# Patient Record
Sex: Female | Born: 1975 | Race: White | Hispanic: No | Marital: Married | Smoking: Former smoker
Health system: Southern US, Community
[De-identification: ages and names within clinical notes are randomized; demographics above are authoritative.]

## PROBLEM LIST (undated history)

## (undated) DIAGNOSIS — F419 Anxiety disorder, unspecified: Secondary | ICD-10-CM

## (undated) DIAGNOSIS — E079 Disorder of thyroid, unspecified: Secondary | ICD-10-CM

## (undated) DIAGNOSIS — E039 Hypothyroidism, unspecified: Secondary | ICD-10-CM

## (undated) HISTORY — PX: CHOLECYSTECTOMY: SHX55

## (undated) HISTORY — PX: ECTOPIC PREGNANCY SURGERY: SHX613

---

## 2005-02-17 ENCOUNTER — Emergency Department: Payer: Self-pay | Admitting: Emergency Medicine

## 2005-02-17 ENCOUNTER — Ambulatory Visit: Payer: Self-pay | Admitting: Emergency Medicine

## 2005-02-25 ENCOUNTER — Ambulatory Visit: Payer: Self-pay | Admitting: Surgery

## 2007-07-31 ENCOUNTER — Other Ambulatory Visit: Payer: Self-pay

## 2007-07-31 ENCOUNTER — Inpatient Hospital Stay: Payer: Self-pay

## 2009-09-13 ENCOUNTER — Ambulatory Visit: Payer: Self-pay | Admitting: Internal Medicine

## 2010-06-15 ENCOUNTER — Ambulatory Visit: Payer: Self-pay | Admitting: Internal Medicine

## 2010-06-25 ENCOUNTER — Ambulatory Visit: Payer: Self-pay | Admitting: Internal Medicine

## 2010-08-11 ENCOUNTER — Ambulatory Visit: Payer: Self-pay | Admitting: Family Medicine

## 2010-11-03 ENCOUNTER — Ambulatory Visit: Payer: Self-pay | Admitting: Family Medicine

## 2012-07-29 ENCOUNTER — Ambulatory Visit: Payer: Self-pay | Admitting: Emergency Medicine

## 2012-07-31 LAB — BETA STREP CULTURE(ARMC)

## 2013-02-02 ENCOUNTER — Emergency Department: Payer: Self-pay | Admitting: Emergency Medicine

## 2013-05-08 ENCOUNTER — Ambulatory Visit: Payer: Self-pay | Admitting: Podiatry

## 2017-01-17 ENCOUNTER — Other Ambulatory Visit: Payer: Self-pay | Admitting: Orthopedic Surgery

## 2017-01-17 DIAGNOSIS — G8929 Other chronic pain: Secondary | ICD-10-CM

## 2017-01-17 DIAGNOSIS — M25512 Pain in left shoulder: Principal | ICD-10-CM

## 2017-01-17 DIAGNOSIS — M25312 Other instability, left shoulder: Secondary | ICD-10-CM

## 2017-01-17 DIAGNOSIS — S46002A Unspecified injury of muscle(s) and tendon(s) of the rotator cuff of left shoulder, initial encounter: Secondary | ICD-10-CM

## 2017-01-23 ENCOUNTER — Ambulatory Visit: Payer: Managed Care, Other (non HMO)

## 2017-01-27 ENCOUNTER — Ambulatory Visit: Payer: Managed Care, Other (non HMO)

## 2017-02-20 ENCOUNTER — Encounter: Payer: Self-pay | Admitting: *Deleted

## 2017-02-20 ENCOUNTER — Ambulatory Visit
Admission: EM | Admit: 2017-02-20 | Discharge: 2017-02-20 | Disposition: A | Discharge status: Home / Self Care | Payer: Managed Care, Other (non HMO) | Attending: Family Medicine | Admitting: Family Medicine

## 2017-02-20 DIAGNOSIS — R109 Unspecified abdominal pain: Secondary | ICD-10-CM | POA: Diagnosis not present

## 2017-02-20 DIAGNOSIS — R0781 Pleurodynia: Secondary | ICD-10-CM

## 2017-02-20 HISTORY — DX: Disorder of thyroid, unspecified: E07.9

## 2017-02-20 LAB — URINALYSIS, COMPLETE (UACMP) WITH MICROSCOPIC
Bilirubin Urine: NEGATIVE
GLUCOSE, UA: NEGATIVE mg/dL
Ketones, ur: NEGATIVE mg/dL
NITRITE: NEGATIVE
PROTEIN: NEGATIVE mg/dL
SPECIFIC GRAVITY, URINE: 1.025 (ref 1.005–1.030)
pH: 7 (ref 5.0–8.0)

## 2017-02-20 MED ORDER — DICLOFENAC SODIUM 75 MG PO TBEC: 75.0000 mg | DELAYED_RELEASE_TABLET | Freq: Two times a day (BID) | ORAL | 0 refills | Status: DC

## 2017-02-20 NOTE — ED Triage Notes (Signed)
LUQ abd pain x2 days. Sharp in quality and is worse laying down. Denies injury and other symptoms.

## 2017-02-20 NOTE — Discharge Instructions (Signed)
Musculoskeletal rib pain.  Medication as prescribed.  Take care  Dr. Lacinda Axon

## 2017-02-20 NOTE — ED Provider Notes (Signed)
[facility]    CSN: [id number] Arrival date & time: 02/20/17  1228     History   Chief Complaint Chief Complaint  Patient presents with  . Abdominal Pain   HPI 41 year old female presents with left rib pain.  Patient states that this is been going on for the past 2 weeks. Unknown inciting event. No fall, trauma, injury. Pain is sharp. Severe at times. Worse at night. Located at a discrete area on a left lower rib. Been worse over the past 48 hours. No associated nausea or vomiting. No reports of abdominal pain. No known exacerbating factors. No other associated symptoms. No other complaints or concerns at this time.    PMH: GAD, Low HDL, Hypertriglyceridemia, RLS, Hypothyroidism, Chronic left shoulder pain  Surgical Hx - Ectopic pregnancy surgery, Cholecystectomy.  Home Medications    Family History Colon cancer Father    Coronary Artery Disease (Blocked arteries around heart) Father    Diabetes type II Father    Pacemaker Father    Stroke Father    Cancer Maternal Grandfather    High blood pressure (Hypertension) Mother    Stroke Mother  mini-strokes being diagnosed currently  Diabetes type II Paternal Uncle    Pacemaker Paternal Uncle    No Known Problems Sister     Social History Social History  Substance Use Topics  . Smoking status: Former Research scientist (life sciences)  . Smokeless tobacco: Never Used  . Alcohol use Yes   Allergies   C[place]alt  Review of Systems Review of Systems  Constitutional: Negative.   Musculoskeletal:       Rib pain.   All other systems reviewed and are negative.  Physical Exam Triage Vital Signs ED Triage Vitals  Enc Vitals Group     BP      Pulse      Resp      Temp      Temp src      SpO2      Weight      Height      Head Circumference      Peak Flow      Pain Score      Pain Loc      Pain Edu?      Excl. in [place]?    Updated Vital Signs BP 124/75 (BP Location: Left Arm)   Pulse 71   Temp 98.7 F (37.1  C) (Oral)   Resp 16   Ht 5\' 3"  (1.6 m)   Wt 253 lb (114.8 kg)   LMP 01/30/2017 (Approximate)   SpO2 100%   BMI 44.82 kg/m   Physical Exam  Constitutional: She is oriented to person, place, and time. She appears well-developed. No distress.  HENT:  Head: Normocephalic and atraumatic.  Nose: Nose normal.  Eyes: Conjunctivae are normal. No scleral icterus.  Neck: Normal range of motion.  Cardiovascular: Normal rate and regular rhythm.   No murmur heard. Pulmonary/Chest: Effort normal. She has no wheezes. She has no rales.  Abdominal: Soft. She exhibits no distension. There is no tenderness. There is no rebound and no guarding.  Musculoskeletal:  Discrete area of tenderness over the left lower rib cage. Exquisitely tender to palpation. Decreased range of motion as a result.  Neurological: She is alert and oriented to person, place, and time.  Skin: Skin is warm. No rash noted.  Psychiatric: She has a normal mood and affect.  Vitals reviewed.  UC Treatments / Results  Labs (all labs ordered  are listed, but only abnormal results are displayed) Labs Reviewed  URINALYSIS, COMPLETE (UACMP) WITH MICROSCOPIC - Abnormal; Notable for the following:       Result Value   APPearance HAZY (*)    Hgb urine dipstick SMALL (*)    Leukocytes, UA SMALL (*)    Squamous Epithelial / LPF 6-30 (*)    Bacteria, UA RARE (*)    All other components within normal limits    EKG  EKG Interpretation None      Radiology No results found.  Procedures Procedures (including critical care time)  Medications Ordered in UC Medications - No data to display   Initial Impression / Assessment and Plan / UC Course  I have reviewed the triage vital signs and the nursing notes.  Pertinent labs & imaging results that were available during my care of the patient were reviewed by me and considered in my medical decision making (see chart for details).   41 year old female presents with MSK/rib pain.  Treating with diclofenac.  Final Clinical Impressions(s) / UC Diagnoses   Final diagnoses:  Rib pain on left side    New Prescriptions Discharge Medication List as of 02/20/2017  1:11 PM    START taking these medications   Details  diclofenac (VOLTAREN) 75 MG EC tablet Take 1 tablet (75 mg total) by mouth 2 (two) times daily., Starting Mon 02/20/2017, Normal       Controlled Substance Prescriptions [place] Controlled Substance Registry consulted? Not Applicable   Coral Spikes, DO 02/20/17 1316

## 2019-03-18 ENCOUNTER — Other Ambulatory Visit: Payer: Self-pay | Admitting: Family Medicine

## 2019-03-18 ENCOUNTER — Encounter (INDEPENDENT_AMBULATORY_CARE_PROVIDER_SITE_OTHER): Payer: Self-pay

## 2019-03-18 ENCOUNTER — Ambulatory Visit
Admission: RE | Admit: 2019-03-18 | Discharge: 2019-03-18 | Disposition: A | Discharge status: Home / Self Care | Payer: Managed Care, Other (non HMO) | Source: Ambulatory Visit | Attending: Family Medicine | Admitting: Family Medicine

## 2019-03-18 ENCOUNTER — Other Ambulatory Visit: Payer: Self-pay

## 2019-03-18 DIAGNOSIS — N939 Abnormal uterine and vaginal bleeding, unspecified: Secondary | ICD-10-CM | POA: Diagnosis present

## 2019-03-19 ENCOUNTER — Other Ambulatory Visit: Payer: Self-pay | Admitting: Family Medicine

## 2019-03-19 DIAGNOSIS — N921 Excessive and frequent menstruation with irregular cycle: Secondary | ICD-10-CM

## 2019-03-19 DIAGNOSIS — N939 Abnormal uterine and vaginal bleeding, unspecified: Secondary | ICD-10-CM

## 2019-03-26 ENCOUNTER — Other Ambulatory Visit: Payer: Self-pay | Admitting: Family Medicine

## 2019-03-26 DIAGNOSIS — Z0000 Encounter for general adult medical examination without abnormal findings: Secondary | ICD-10-CM

## 2019-03-26 DIAGNOSIS — N939 Abnormal uterine and vaginal bleeding, unspecified: Secondary | ICD-10-CM

## 2019-06-07 ENCOUNTER — Other Ambulatory Visit: Payer: Self-pay | Admitting: Obstetrics & Gynecology

## 2019-06-07 DIAGNOSIS — R1907 Generalized intra-abdominal and pelvic swelling, mass and lump: Secondary | ICD-10-CM

## 2019-06-21 ENCOUNTER — Other Ambulatory Visit: Payer: Self-pay

## 2019-06-21 ENCOUNTER — Ambulatory Visit
Admission: RE | Admit: 2019-06-21 | Discharge: 2019-06-21 | Disposition: A | Discharge status: Home / Self Care | Payer: Managed Care, Other (non HMO) | Source: Ambulatory Visit | Attending: Obstetrics & Gynecology | Admitting: Obstetrics & Gynecology

## 2019-06-21 DIAGNOSIS — R1907 Generalized intra-abdominal and pelvic swelling, mass and lump: Secondary | ICD-10-CM | POA: Diagnosis present

## 2019-06-21 MED ORDER — IOHEXOL 300 MG/ML  SOLN
100.0000 mL | Freq: Once | INTRAMUSCULAR | Status: AC | PRN
  Administered 2019-06-21: 09:00:00 100 mL via INTRAVENOUS

## 2019-07-02 ENCOUNTER — Other Ambulatory Visit: Payer: Self-pay | Admitting: Obstetrics & Gynecology

## 2019-07-12 ENCOUNTER — Encounter: Admission: RE | Admit: 2019-07-12 | Payer: Managed Care, Other (non HMO) | Source: Ambulatory Visit

## 2019-07-16 ENCOUNTER — Encounter
Admission: RE | Admit: 2019-07-16 | Discharge: 2019-07-16 | Disposition: A | Discharge status: Home / Self Care | Payer: Managed Care, Other (non HMO) | Source: Ambulatory Visit | Attending: Obstetrics & Gynecology | Admitting: Obstetrics & Gynecology

## 2019-07-16 ENCOUNTER — Other Ambulatory Visit: Payer: Self-pay

## 2019-07-16 HISTORY — DX: Hypothyroidism, unspecified: E03.9

## 2019-07-16 HISTORY — DX: Anxiety disorder, unspecified: F41.9

## 2019-07-16 NOTE — Patient Instructions (Signed)
Your procedure is scheduled on:Mon 3/8 Report to Day Surgery. [facility] To find out your arrival time please call [phone] between 1PM - 3PM on Friday 3/5.  Remember: Instructions that are not followed completely may result in serious medical risk,  up to and including death, or upon the discretion of your surgeon and anesthesiologist your  surgery may need to be rescheduled.     _X__ 1. Do not eat food after midnight the night before your procedure.                 No gum chewing or hard candies. You may drink clear liquids up to 2 hours                 before you are scheduled to arrive for your surgery- DO not drink clear                 liquids within 2 hours of the start of your surgery.                 Clear Liquids include:  water, apple juice without pulp, clear Gatorade, G2 or                  Gatorade Zero (avoid Red/Purple/Blue), Black Coffee or Tea (Do not add                 anything to coffee or tea). ___x__2.   Complete the carbohydrate drink provided to you, 2 hours before arrival.  __X__2.  On the morning of surgery brush your teeth with toothpaste and water, you                may rinse your mouth with mouthwash if you wish.  Do not swallow any toothpaste of mouthwash.     _X__ 3.  No Alcohol for 24 hours before or after surgery.   __ 4.  Do Not Smoke or use e-cigarettes For 24 Hours Prior to Your Surgery.                 Do not use any chewable tobacco products for at least 6 hours prior to                 surgery.  ____  5.  Bring all medications with you on the day of surgery if instructed.   ___x_  6.  Notify your doctor if there is any change in your medical condition      (cold, fever, infections).     Do not wear jewelry, make-up, hairpins, clips or nail polish. Do not wear lotions, powders, or perfumes. You may wear deodorant. Do not shave 48 hours prior to surgery. Men may shave face and neck. Do not bring valuables to the  hospital.    [facility] is not responsible for any belongings or valuables.  Contacts, dentures or bridgework may not be worn into surgery. Leave your suitcase in the car. After surgery it may be brought to your room. For patients admitted to the hospital, discharge time is determined by your treatment team.   Patients discharged the day of surgery will not be allowed to drive home.   Make arrangements for someone to be with you for the first 24 hours of your Same Day Discharge.    Please read over the following fact sheets that you were given:     __x__ Take these medicines the morning of surgery with A SIP OF WATER:  1. levothyroxine (SYNTHROID, LEVOTHROID) 125 MCG tablet  2.   3.   4.  5.  6.  ____ Fleet Enema (as directed)   __x__ Use CHG Soap (or wipes) as directed  ____ Use Benzoyl Peroxide Gel as instructed  ____ Use inhalers on the day of surgery  ____ Stop metformin 2 days prior to surgery    ____ Take 1/2 of usual insulin dose the night before surgery. No insulin the morning          of surgery.   ____ Stop Coumadin/Plavix/aspirin on   __x__ Stop Anti-inflammatories ibuprofen aleve or aspirin  Today.  May take tylenol   ____ Stop supplements until after surgery.    ____ Bring C-Pap to the hospital.

## 2019-07-18 ENCOUNTER — Other Ambulatory Visit
Admission: RE | Admit: 2019-07-18 | Discharge: 2019-07-18 | Disposition: A | Discharge status: Home / Self Care | Payer: Managed Care, Other (non HMO) | Source: Ambulatory Visit | Attending: Obstetrics & Gynecology | Admitting: Obstetrics & Gynecology

## 2019-07-18 DIAGNOSIS — Z01812 Encounter for preprocedural laboratory examination: Secondary | ICD-10-CM | POA: Diagnosis present

## 2019-07-18 DIAGNOSIS — Z20822 Contact with and (suspected) exposure to covid-19: Secondary | ICD-10-CM | POA: Insufficient documentation

## 2019-07-18 LAB — BASIC METABOLIC PANEL
Anion gap: 7 (ref 5–15)
BUN: 8 mg/dL (ref 6–20)
CO2: 27 mmol/L (ref 22–32)
Calcium: 9.1 mg/dL (ref 8.9–10.3)
Chloride: 104 mmol/L (ref 98–111)
Creatinine, Ser: 0.64 mg/dL (ref 0.44–1.00)
GFR calc Af Amer: >60 mL/min (ref >60)
GFR calc non Af Amer: >60 mL/min (ref >60)
Glucose, Bld: 123 mg/dL — ABNORMAL HIGH (ref 70–99)
Potassium: 4 mmol/L (ref 3.5–5.1)
Sodium: 138 mmol/L (ref 135–145)

## 2019-07-18 LAB — CBC
HCT: 42.8 % (ref 36.0–46.0)
Hemoglobin: 13.8 g/dL (ref 12.0–15.0)
MCH: 29.4 pg (ref 26.0–34.0)
MCHC: 32.2 g/dL (ref 30.0–36.0)
MCV: 91.3 fL (ref 80.0–100.0)
Platelets: 387 10*3/uL (ref 150–400)
RBC: 4.69 MIL/uL (ref 3.87–5.11)
RDW: 12.8 % (ref 11.5–15.5)
WBC: 8.7 10*3/uL (ref 4.0–10.5)
nRBC: 0 % (ref 0.0–0.2)

## 2019-07-19 LAB — SARS CORONAVIRUS 2 (TAT 6-24 HRS): SARS Coronavirus 2: NEGATIVE

## 2019-07-21 MED ORDER — DEXTROSE 5 % IV SOLN
3.0000 g | INTRAVENOUS | Status: AC
  Administered 2019-07-22 (×2): 3 g via INTRAVENOUS
  Filled 2019-07-21: qty 3

## 2019-07-22 ENCOUNTER — Inpatient Hospital Stay
Admission: RE | Admit: 2019-07-22 | Discharge: 2019-08-19 | DRG: 742 | Disposition: A | Discharge status: Home Health | Payer: Managed Care, Other (non HMO) | Attending: Surgery | Admitting: Surgery

## 2019-07-22 ENCOUNTER — Other Ambulatory Visit: Payer: Self-pay

## 2019-07-22 ENCOUNTER — Ambulatory Visit: Payer: Managed Care, Other (non HMO) | Admitting: Anesthesiology

## 2019-07-22 ENCOUNTER — Encounter: Payer: Self-pay | Admitting: Obstetrics & Gynecology

## 2019-07-22 ENCOUNTER — Encounter
Admission: RE | Disposition: A | Discharge status: Home Health | Payer: Self-pay | Source: Home / Self Care | Attending: Surgery

## 2019-07-22 ENCOUNTER — Ambulatory Visit: Payer: Managed Care, Other (non HMO)

## 2019-07-22 DIAGNOSIS — D508 Other iron deficiency anemias: Secondary | ICD-10-CM

## 2019-07-22 DIAGNOSIS — T8143XA Infection following a procedure, organ and space surgical site, initial encounter: Secondary | ICD-10-CM | POA: Diagnosis not present

## 2019-07-22 DIAGNOSIS — D696 Thrombocytopenia, unspecified: Secondary | ICD-10-CM | POA: Diagnosis not present

## 2019-07-22 DIAGNOSIS — K9172 Accidental puncture and laceration of a digestive system organ or structure during other procedure: Secondary | ICD-10-CM | POA: Diagnosis not present

## 2019-07-22 DIAGNOSIS — Z79890 Hormone replacement therapy (postmenopausal): Secondary | ICD-10-CM | POA: Diagnosis not present

## 2019-07-22 DIAGNOSIS — Z933 Colostomy status: Secondary | ICD-10-CM | POA: Diagnosis not present

## 2019-07-22 DIAGNOSIS — R188 Other ascites: Secondary | ICD-10-CM | POA: Diagnosis not present

## 2019-07-22 DIAGNOSIS — Z9889 Other specified postprocedural states: Secondary | ICD-10-CM

## 2019-07-22 DIAGNOSIS — Y658 Other specified misadventures during surgical and medical care: Secondary | ICD-10-CM | POA: Diagnosis not present

## 2019-07-22 DIAGNOSIS — K631 Perforation of intestine (nontraumatic): Secondary | ICD-10-CM | POA: Diagnosis not present

## 2019-07-22 DIAGNOSIS — E039 Hypothyroidism, unspecified: Secondary | ICD-10-CM | POA: Diagnosis present

## 2019-07-22 DIAGNOSIS — D251 Intramural leiomyoma of uterus: Principal | ICD-10-CM | POA: Diagnosis present

## 2019-07-22 DIAGNOSIS — R102 Pelvic and perineal pain: Secondary | ICD-10-CM | POA: Diagnosis present

## 2019-07-22 DIAGNOSIS — D62 Acute posthemorrhagic anemia: Secondary | ICD-10-CM | POA: Diagnosis not present

## 2019-07-22 DIAGNOSIS — Z6841 Body Mass Index (BMI) 40.0 and over, adult: Secondary | ICD-10-CM | POA: Diagnosis not present

## 2019-07-22 DIAGNOSIS — F419 Anxiety disorder, unspecified: Secondary | ICD-10-CM | POA: Diagnosis present

## 2019-07-22 DIAGNOSIS — Z87891 Personal history of nicotine dependence: Secondary | ICD-10-CM

## 2019-07-22 DIAGNOSIS — K9189 Other postprocedural complications and disorders of digestive system: Secondary | ICD-10-CM | POA: Diagnosis not present

## 2019-07-22 DIAGNOSIS — T8132XA Disruption of internal operation (surgical) wound, not elsewhere classified, initial encounter: Secondary | ICD-10-CM | POA: Diagnosis not present

## 2019-07-22 DIAGNOSIS — K660 Peritoneal adhesions (postprocedural) (postinfection): Secondary | ICD-10-CM | POA: Diagnosis present

## 2019-07-22 DIAGNOSIS — Z189 Retained foreign body fragments, unspecified material: Secondary | ICD-10-CM

## 2019-07-22 DIAGNOSIS — K651 Peritoneal abscess: Secondary | ICD-10-CM | POA: Diagnosis not present

## 2019-07-22 DIAGNOSIS — Z79899 Other long term (current) drug therapy: Secondary | ICD-10-CM

## 2019-07-22 DIAGNOSIS — D649 Anemia, unspecified: Secondary | ICD-10-CM

## 2019-07-22 DIAGNOSIS — R000 Tachycardia, unspecified: Secondary | ICD-10-CM

## 2019-07-22 DIAGNOSIS — D75839 Thrombocytosis, unspecified: Secondary | ICD-10-CM

## 2019-07-22 DIAGNOSIS — L02211 Cutaneous abscess of abdominal wall: Secondary | ICD-10-CM | POA: Diagnosis not present

## 2019-07-22 DIAGNOSIS — Z932 Ileostomy status: Secondary | ICD-10-CM | POA: Diagnosis not present

## 2019-07-22 DIAGNOSIS — Z1624 Resistance to multiple antibiotics: Secondary | ICD-10-CM | POA: Diagnosis not present

## 2019-07-22 DIAGNOSIS — R0682 Tachypnea, not elsewhere classified: Secondary | ICD-10-CM

## 2019-07-22 DIAGNOSIS — D473 Essential (hemorrhagic) thrombocythemia: Secondary | ICD-10-CM | POA: Diagnosis not present

## 2019-07-22 DIAGNOSIS — K56609 Unspecified intestinal obstruction, unspecified as to partial versus complete obstruction: Secondary | ICD-10-CM

## 2019-07-22 DIAGNOSIS — G4700 Insomnia, unspecified: Secondary | ICD-10-CM | POA: Diagnosis not present

## 2019-07-22 DIAGNOSIS — Z95828 Presence of other vascular implants and grafts: Secondary | ICD-10-CM | POA: Diagnosis not present

## 2019-07-22 DIAGNOSIS — E538 Deficiency of other specified B group vitamins: Secondary | ICD-10-CM | POA: Diagnosis not present

## 2019-07-22 DIAGNOSIS — B9620 Unspecified Escherichia coli [E. coli] as the cause of diseases classified elsewhere: Secondary | ICD-10-CM | POA: Diagnosis not present

## 2019-07-22 DIAGNOSIS — Z91048 Other nonmedicinal substance allergy status: Secondary | ICD-10-CM | POA: Diagnosis not present

## 2019-07-22 DIAGNOSIS — L0291 Cutaneous abscess, unspecified: Secondary | ICD-10-CM

## 2019-07-22 DIAGNOSIS — Z978 Presence of other specified devices: Secondary | ICD-10-CM | POA: Diagnosis not present

## 2019-07-22 DIAGNOSIS — Z960 Presence of urogenital implants: Secondary | ICD-10-CM | POA: Diagnosis not present

## 2019-07-22 DIAGNOSIS — D259 Leiomyoma of uterus, unspecified: Secondary | ICD-10-CM | POA: Diagnosis present

## 2019-07-22 DIAGNOSIS — T8130XA Disruption of wound, unspecified, initial encounter: Secondary | ICD-10-CM | POA: Diagnosis not present

## 2019-07-22 DIAGNOSIS — Z90710 Acquired absence of both cervix and uterus: Secondary | ICD-10-CM | POA: Diagnosis not present

## 2019-07-22 DIAGNOSIS — D72829 Elevated white blood cell count, unspecified: Secondary | ICD-10-CM | POA: Diagnosis not present

## 2019-07-22 HISTORY — PX: ROBOTIC ASSISTED TOTAL HYSTERECTOMY WITH BILATERAL SALPINGO OOPHERECTOMY: SHX6086

## 2019-07-22 HISTORY — PX: BOWEL RESECTION: SHX1257

## 2019-07-22 HISTORY — PX: LAPAROTOMY: SHX154

## 2019-07-22 HISTORY — PX: HYSTERECTOMY ABDOMINAL WITH SALPINGECTOMY: SHX6725

## 2019-07-22 LAB — CBC
HCT: 31.8 % — ABNORMAL LOW (ref 36.0–46.0)
Hemoglobin: 10.5 g/dL — ABNORMAL LOW (ref 12.0–15.0)
MCH: 29.7 pg (ref 26.0–34.0)
MCHC: 33 g/dL (ref 30.0–36.0)
MCV: 89.8 fL (ref 80.0–100.0)
Platelets: 320 10*3/uL (ref 150–400)
RBC: 3.54 MIL/uL — ABNORMAL LOW (ref 3.87–5.11)
RDW: 12.9 % (ref 11.5–15.5)
WBC: 16.2 10*3/uL — ABNORMAL HIGH (ref 4.0–10.5)
nRBC: 0 % (ref 0.0–0.2)

## 2019-07-22 LAB — ABO/RH: ABO/RH(D): O POS

## 2019-07-22 LAB — POCT PREGNANCY, URINE: Preg Test, Ur: NEGATIVE

## 2019-07-22 SURGERY — HYSTERECTOMY, TOTAL, ROBOT-ASSISTED, LAPAROSCOPIC, WITH BILATERAL SALPINGO-OOPHORECTOMY
Anesthesia: General | Site: Abdomen

## 2019-07-22 MED ORDER — FAMOTIDINE 20 MG PO TABS: 20.0000 mg | ORAL_TABLET | Freq: Once | ORAL | Status: AC

## 2019-07-22 MED ORDER — KETOROLAC TROMETHAMINE 15 MG/ML IJ SOLN: 15.0000 mg | INTRAMUSCULAR | Status: AC

## 2019-07-22 MED ORDER — FENTANYL CITRATE (PF) 250 MCG/5ML IJ SOLN
INTRAMUSCULAR | Status: DC | PRN
  Administered 2019-07-22 (×3): 50 ug via INTRAVENOUS
  Administered 2019-07-22: 100 ug via INTRAVENOUS

## 2019-07-22 MED ORDER — ONDANSETRON HCL 4 MG/2ML IJ SOLN: 4.0000 mg | Freq: Once | INTRAMUSCULAR | Status: DC | PRN

## 2019-07-22 MED ORDER — VASOPRESSIN 20 UNIT/ML IV SOLN
INTRAVENOUS | Status: DC | PRN
  Administered 2019-07-22 (×2): 1 [IU] via INTRAVENOUS
  Administered 2019-07-22: 2 [IU] via INTRAVENOUS
  Administered 2019-07-22 (×3): 1 [IU] via INTRAVENOUS
  Administered 2019-07-22: 2 [IU] via INTRAVENOUS
  Administered 2019-07-22 (×2): 1 [IU] via INTRAVENOUS
  Administered 2019-07-22 (×2): 2 [IU] via INTRAVENOUS

## 2019-07-22 MED ORDER — DEXMEDETOMIDINE HCL IN NACL 400 MCG/100ML IV SOLN
INTRAVENOUS | Status: DC | PRN
  Administered 2019-07-22: 8 ug via INTRAVENOUS

## 2019-07-22 MED ORDER — OXYCODONE HCL 5 MG PO TABS: 5.0000 mg | ORAL_TABLET | ORAL | 0 refills | Status: DC | PRN

## 2019-07-22 MED ORDER — ACETAMINOPHEN 10 MG/ML IV SOLN
INTRAVENOUS | Status: AC
  Filled 2019-07-22: qty 100

## 2019-07-22 MED ORDER — HEPARIN SODIUM (PORCINE) 5000 UNIT/ML IJ SOLN
INTRAMUSCULAR | Status: AC
  Administered 2019-07-22: 5000 [IU] via SUBCUTANEOUS
  Filled 2019-07-22: qty 1

## 2019-07-22 MED ORDER — FENTANYL CITRATE (PF) 100 MCG/2ML IJ SOLN
INTRAMUSCULAR | Status: AC
  Filled 2019-07-22: qty 2

## 2019-07-22 MED ORDER — KETOROLAC TROMETHAMINE 30 MG/ML IJ SOLN: 30.0000 mg | Freq: Once | INTRAMUSCULAR | Status: DC

## 2019-07-22 MED ORDER — ROCURONIUM BROMIDE 50 MG/5ML IV SOLN
INTRAVENOUS | Status: AC
  Filled 2019-07-22: qty 1

## 2019-07-22 MED ORDER — MIDAZOLAM HCL 2 MG/2ML IJ SOLN
INTRAMUSCULAR | Status: DC | PRN
  Administered 2019-07-22: 2 mg via INTRAVENOUS

## 2019-07-22 MED ORDER — KETAMINE HCL 10 MG/ML IJ SOLN
INTRAMUSCULAR | Status: DC | PRN
  Administered 2019-07-22 (×3): 25 mg via INTRAVENOUS

## 2019-07-22 MED ORDER — DEXMEDETOMIDINE HCL IN NACL 80 MCG/20ML IV SOLN
INTRAVENOUS | Status: AC
  Filled 2019-07-22: qty 20

## 2019-07-22 MED ORDER — HYDROMORPHONE HCL 1 MG/ML IJ SOLN
INTRAMUSCULAR | Status: DC | PRN
  Administered 2019-07-22 (×3): .2 mg via INTRAVENOUS

## 2019-07-22 MED ORDER — KETAMINE HCL 50 MG/ML IJ SOLN
INTRAMUSCULAR | Status: AC
  Filled 2019-07-22: qty 10

## 2019-07-22 MED ORDER — PROMETHAZINE HCL 25 MG/ML IJ SOLN: 6.2500 mg | INTRAMUSCULAR | Status: DC | PRN

## 2019-07-22 MED ORDER — SODIUM CHLORIDE 0.9 % IV SOLN
INTRAVENOUS | Status: DC | PRN
  Administered 2019-07-22: 30 ug/min via INTRAVENOUS

## 2019-07-22 MED ORDER — MIDAZOLAM HCL 2 MG/2ML IJ SOLN
INTRAMUSCULAR | Status: AC
  Filled 2019-07-22: qty 2

## 2019-07-22 MED ORDER — ENSURE PRE-SURGERY PO LIQD
296.0000 mL | Freq: Once | ORAL | Status: DC
  Filled 2019-07-22: qty 296

## 2019-07-22 MED ORDER — ACETAMINOPHEN 10 MG/ML IV SOLN
INTRAVENOUS | Status: DC | PRN
  Administered 2019-07-22: 1000 mg via INTRAVENOUS

## 2019-07-22 MED ORDER — LIDOCAINE HCL (CARDIAC) PF 100 MG/5ML IV SOSY
PREFILLED_SYRINGE | INTRAVENOUS | Status: DC | PRN
  Administered 2019-07-22: 100 mg via INTRAVENOUS

## 2019-07-22 MED ORDER — BUPIVACAINE HCL (PF) 0.5 % IJ SOLN
INTRAMUSCULAR | Status: AC
  Filled 2019-07-22: qty 30

## 2019-07-22 MED ORDER — LIDOCAINE HCL (PF) 2 % IJ SOLN
INTRAMUSCULAR | Status: AC
  Filled 2019-07-22: qty 5

## 2019-07-22 MED ORDER — FENTANYL CITRATE (PF) 100 MCG/2ML IJ SOLN
INTRAMUSCULAR | Status: DC | PRN
  Administered 2019-07-22 (×2): 50 ug via INTRAVENOUS

## 2019-07-22 MED ORDER — DEXAMETHASONE SODIUM PHOSPHATE 10 MG/ML IJ SOLN
INTRAMUSCULAR | Status: AC
  Administered 2019-07-22: 4 mg via INTRAVENOUS
  Filled 2019-07-22: qty 1

## 2019-07-22 MED ORDER — DEXAMETHASONE SODIUM PHOSPHATE 10 MG/ML IJ SOLN
INTRAMUSCULAR | Status: DC | PRN
  Administered 2019-07-22: 5 mg via INTRAVENOUS

## 2019-07-22 MED ORDER — FAMOTIDINE 20 MG PO TABS
ORAL_TABLET | ORAL | Status: AC
  Administered 2019-07-22: 20 mg via ORAL
  Filled 2019-07-22: qty 1

## 2019-07-22 MED ORDER — GABAPENTIN 300 MG PO CAPS
ORAL_CAPSULE | ORAL | Status: AC
  Administered 2019-07-22: 600 mg via ORAL
  Filled 2019-07-22: qty 2

## 2019-07-22 MED ORDER — ACETAMINOPHEN 500 MG PO TABS
ORAL_TABLET | ORAL | Status: AC
  Administered 2019-07-22: 1000 mg via ORAL
  Filled 2019-07-22: qty 2

## 2019-07-22 MED ORDER — FENTANYL CITRATE (PF) 250 MCG/5ML IJ SOLN
INTRAMUSCULAR | Status: AC
  Filled 2019-07-22: qty 5

## 2019-07-22 MED ORDER — ALBUMIN HUMAN 5 % IV SOLN: INTRAVENOUS | Status: DC | PRN

## 2019-07-22 MED ORDER — SODIUM CHLORIDE 0.9 % IV SOLN
INTRAVENOUS | Status: AC
  Filled 2019-07-22: qty 2

## 2019-07-22 MED ORDER — HYDROMORPHONE HCL 1 MG/ML IJ SOLN
INTRAMUSCULAR | Status: AC
  Filled 2019-07-22: qty 1

## 2019-07-22 MED ORDER — GABAPENTIN 300 MG PO CAPS: 600.0000 mg | ORAL_CAPSULE | ORAL | Status: AC

## 2019-07-22 MED ORDER — PROPOFOL 10 MG/ML IV BOLUS
INTRAVENOUS | Status: DC | PRN
  Administered 2019-07-22: 150 mg via INTRAVENOUS

## 2019-07-22 MED ORDER — DEXTROSE 5 % IV SOLN
INTRAVENOUS | Status: DC | PRN
  Administered 2019-07-22: 3 g via INTRAVENOUS

## 2019-07-22 MED ORDER — HYDROMORPHONE HCL 1 MG/ML IJ SOLN: 0.2500 mg | INTRAMUSCULAR | Status: DC | PRN

## 2019-07-22 MED ORDER — DEXTROSE 5 % IV SOLN
3.0000 g | Freq: Once | INTRAVENOUS | Status: DC
  Filled 2019-07-22: qty 3

## 2019-07-22 MED ORDER — DEXAMETHASONE SODIUM PHOSPHATE 10 MG/ML IJ SOLN: 4.0000 mg | INTRAMUSCULAR | Status: AC

## 2019-07-22 MED ORDER — SUCCINYLCHOLINE CHLORIDE 20 MG/ML IJ SOLN
INTRAMUSCULAR | Status: AC
  Filled 2019-07-22: qty 1

## 2019-07-22 MED ORDER — ALBUMIN HUMAN 5 % IV SOLN
INTRAVENOUS | Status: AC
  Filled 2019-07-22: qty 500

## 2019-07-22 MED ORDER — HEPARIN SODIUM (PORCINE) 5000 UNIT/ML IJ SOLN: 5000.0000 [IU] | INTRAMUSCULAR | Status: AC

## 2019-07-22 MED ORDER — SUGAMMADEX SODIUM 500 MG/5ML IV SOLN
INTRAVENOUS | Status: AC
  Filled 2019-07-22: qty 5

## 2019-07-22 MED ORDER — PROPOFOL 10 MG/ML IV BOLUS
INTRAVENOUS | Status: AC
  Filled 2019-07-22: qty 20

## 2019-07-22 MED ORDER — ROCURONIUM BROMIDE 100 MG/10ML IV SOLN
INTRAVENOUS | Status: DC | PRN
  Administered 2019-07-22: 10 mg via INTRAVENOUS
  Administered 2019-07-22: 20 mg via INTRAVENOUS
  Administered 2019-07-22: 30 mg via INTRAVENOUS
  Administered 2019-07-22 (×3): 10 mg via INTRAVENOUS
  Administered 2019-07-22: 30 mg via INTRAVENOUS
  Administered 2019-07-22: 10 mg via INTRAVENOUS
  Administered 2019-07-22: 20 mg via INTRAVENOUS

## 2019-07-22 MED ORDER — LACTATED RINGERS IV SOLN
INTRAVENOUS | Status: DC
  Administered 2019-07-22: 50 mL/h via INTRAVENOUS

## 2019-07-22 MED ORDER — SCOPOLAMINE 1 MG/3DAYS TD PT72
1.0000 | MEDICATED_PATCH | TRANSDERMAL | Status: DC
  Filled 2019-07-22: qty 1

## 2019-07-22 MED ORDER — IBUPROFEN 800 MG PO TABS: 800.0000 mg | ORAL_TABLET | Freq: Four times a day (QID) | ORAL | 1 refills | Status: DC

## 2019-07-22 MED ORDER — LACTATED RINGERS IV SOLN: INTRAVENOUS | Status: DC | PRN

## 2019-07-22 MED ORDER — ONDANSETRON HCL 4 MG/2ML IJ SOLN
INTRAMUSCULAR | Status: DC | PRN
  Administered 2019-07-22: 4 mg via INTRAVENOUS

## 2019-07-22 MED ORDER — PHENYLEPHRINE HCL (PRESSORS) 10 MG/ML IV SOLN
INTRAVENOUS | Status: DC | PRN
  Administered 2019-07-22 (×2): 100 ug via INTRAVENOUS
  Administered 2019-07-22: 150 ug via INTRAVENOUS
  Administered 2019-07-22 (×7): 100 ug via INTRAVENOUS

## 2019-07-22 MED ORDER — SCOPOLAMINE 1 MG/3DAYS TD PT72
MEDICATED_PATCH | TRANSDERMAL | Status: AC
  Administered 2019-07-22: 1.5 mg via TRANSDERMAL
  Filled 2019-07-22: qty 1

## 2019-07-22 MED ORDER — KETOROLAC TROMETHAMINE 15 MG/ML IJ SOLN
INTRAMUSCULAR | Status: AC
  Administered 2019-07-22: 15 mg via INTRAVENOUS
  Filled 2019-07-22: qty 1

## 2019-07-22 MED ORDER — BUPIVACAINE LIPOSOME 1.3 % IJ SUSP
INTRAMUSCULAR | Status: AC
  Filled 2019-07-22: qty 20

## 2019-07-22 MED ORDER — BUPIVACAINE LIPOSOME 1.3 % IJ SUSP
INTRAMUSCULAR | Status: DC | PRN
  Administered 2019-07-22: 22:00:00 70 mL

## 2019-07-22 MED ORDER — ONDANSETRON HCL 4 MG/2ML IJ SOLN
INTRAMUSCULAR | Status: AC
  Filled 2019-07-22: qty 2

## 2019-07-22 MED ORDER — SUGAMMADEX SODIUM 200 MG/2ML IV SOLN
INTRAVENOUS | Status: DC | PRN
  Administered 2019-07-22: 240 mg via INTRAVENOUS

## 2019-07-22 MED ORDER — ACETAMINOPHEN 500 MG PO TABS: 1000.0000 mg | ORAL_TABLET | ORAL | Status: AC

## 2019-07-22 MED ORDER — SUCCINYLCHOLINE CHLORIDE 20 MG/ML IJ SOLN
INTRAMUSCULAR | Status: DC | PRN
  Administered 2019-07-22: 100 mg via INTRAVENOUS

## 2019-07-22 MED ORDER — SODIUM CHLORIDE (PF) 0.9 % IJ SOLN
INTRAMUSCULAR | Status: DC | PRN
  Administered 2019-07-22: 50 mL via INTRAVENOUS

## 2019-07-22 MED ORDER — FENTANYL CITRATE (PF) 100 MCG/2ML IJ SOLN
25.0000 ug | INTRAMUSCULAR | Status: DC | PRN
  Administered 2019-07-23: 25 ug via INTRAVENOUS

## 2019-07-22 MED ORDER — ALBUMIN HUMAN 5 % IV SOLN
INTRAVENOUS | Status: AC
  Filled 2019-07-22: qty 250

## 2019-07-22 MED ORDER — CEFAZOLIN SODIUM 1 G IJ SOLR
INTRAMUSCULAR | Status: AC
  Filled 2019-07-22: qty 30

## 2019-07-22 MED ORDER — DEXAMETHASONE SODIUM PHOSPHATE 10 MG/ML IJ SOLN
INTRAMUSCULAR | Status: AC
  Filled 2019-07-22: qty 1

## 2019-07-22 MED ORDER — SODIUM CHLORIDE (PF) 0.9 % IJ SOLN
INTRAMUSCULAR | Status: AC
  Filled 2019-07-22: qty 50

## 2019-07-22 SURGICAL SUPPLY — 84 items
BAG URINE DRAIN 2000ML AR STRL (UROLOGICAL SUPPLIES) ×5 IMPLANT
BINDER ABDOMINAL 12 ML 46-62 (SOFTGOODS) ×5 IMPLANT
BLADE SURG SZ10 CARB STEEL (BLADE) ×10 IMPLANT
BLADE SURG SZ11 CARB STEEL (BLADE) ×10 IMPLANT
CANISTER SUCT 1200ML W/VALVE (MISCELLANEOUS) ×15 IMPLANT
CANISTER SUCT 3000ML PPV (MISCELLANEOUS) ×5 IMPLANT
CATH FOLEY 2WAY  5CC 16FR (CATHETERS) ×2
CATH URTH 16FR FL 2W BLN LF (CATHETERS) ×3 IMPLANT
CHLORAPREP W/TINT 26 (MISCELLANEOUS) ×5 IMPLANT
COVER TIP SHEARS 8 DVNC (MISCELLANEOUS) ×3 IMPLANT
COVER TIP SHEARS 8MM DA VINCI (MISCELLANEOUS) ×2
COVER WAND RF STERILE (DRAPES) ×5 IMPLANT
DEFOGGER SCOPE WARMER CLEARIFY (MISCELLANEOUS) ×5 IMPLANT
DERMABOND ADVANCED (GAUZE/BANDAGES/DRESSINGS) ×2
DERMABOND ADVANCED .7 DNX12 (GAUZE/BANDAGES/DRESSINGS) ×3 IMPLANT
DRAPE 3/4 80X56 (DRAPES) ×15 IMPLANT
DRAPE ARM DVNC X/XI (DISPOSABLE) ×12 IMPLANT
DRAPE COLUMN DVNC XI (DISPOSABLE) ×3 IMPLANT
DRAPE DA VINCI XI ARM (DISPOSABLE) ×8
DRAPE DA VINCI XI COLUMN (DISPOSABLE) ×2
DRAPE LEGGINS SURG 28X43 STRL (DRAPES) ×5 IMPLANT
DRAPE UNDER BUTTOCK W/FLU (DRAPES) ×5 IMPLANT
ELECT BLADE 6.5 EXT (BLADE) ×5 IMPLANT
ELECT CAUTERY BLADE 6.4 (BLADE) ×5 IMPLANT
ELECT REM PT RETURN 9FT ADLT (ELECTROSURGICAL) ×5
ELECTRODE REM PT RTRN 9FT ADLT (ELECTROSURGICAL) ×3 IMPLANT
GLOVE SURG SYN 6.5 ES PF (GLOVE) ×20 IMPLANT
GOWN STRL REUS W/ TWL LRG LVL3 (GOWN DISPOSABLE) ×18 IMPLANT
GOWN STRL REUS W/TWL LRG LVL3 (GOWN DISPOSABLE) ×12
IRRIGATION STRYKERFLOW (MISCELLANEOUS) IMPLANT
IRRIGATOR STRYKERFLOW (MISCELLANEOUS)
IV NS 1000ML (IV SOLUTION)
IV NS 1000ML BAXH (IV SOLUTION) IMPLANT
KIT PINK PAD W/HEAD ARE REST (MISCELLANEOUS) ×5
KIT PINK PAD W/HEAD ARM REST (MISCELLANEOUS) ×3 IMPLANT
L-HOOK LAP DISP 36CM (ELECTROSURGICAL) ×5
LABEL OR SOLS (LABEL) ×5 IMPLANT
LHOOK LAP DISP 36CM (ELECTROSURGICAL) ×3 IMPLANT
LIGASURE VESSEL 5MM BLUNT TIP (ELECTROSURGICAL) ×5 IMPLANT
MANIPULATOR VCARE LG CRV RETR (MISCELLANEOUS) IMPLANT
MANIPULATOR VCARE SML CRV RETR (MISCELLANEOUS) IMPLANT
MANIPULATOR VCARE STD CRV RETR (MISCELLANEOUS) ×5 IMPLANT
NDL SAFETY ECLIPSE 18X1.5 (NEEDLE) ×3 IMPLANT
NEEDLE HYPO 18GX1.5 SHARP (NEEDLE) ×2
NEEDLE HYPO 22GX1.5 SAFETY (NEEDLE) ×5 IMPLANT
NEEDLE VERESS 14GA 120MM (NEEDLE) ×5 IMPLANT
NS IRRIG 1000ML POUR BTL (IV SOLUTION) ×5 IMPLANT
OBTURATOR OPTICAL STANDARD 8MM (TROCAR) ×2
OBTURATOR OPTICAL STND 8 DVNC (TROCAR) ×3
OBTURATOR OPTICALSTD 8 DVNC (TROCAR) ×3 IMPLANT
PACK BASIN MAJOR ARMC (MISCELLANEOUS) ×5 IMPLANT
PACK LAP CHOLECYSTECTOMY (MISCELLANEOUS) ×5 IMPLANT
PAD OB MATERNITY 4.3X12.25 (PERSONAL CARE ITEMS) ×5 IMPLANT
PAD PREP 24X41 OB/GYN DISP (PERSONAL CARE ITEMS) ×5 IMPLANT
PENCIL ELECTRO HAND CTR (MISCELLANEOUS) ×5 IMPLANT
RELOAD PROXIMATE 75MM BLUE (ENDOMECHANICALS) ×25 IMPLANT
SEAL CANN UNIV 5-8 DVNC XI (MISCELLANEOUS) ×12 IMPLANT
SEAL XI 5MM-8MM UNIVERSAL (MISCELLANEOUS) ×8
SEALER VESSEL DA VINCI XI (MISCELLANEOUS)
SEALER VESSEL EXT DVNC XI (MISCELLANEOUS) IMPLANT
SET CYSTO W/LG BORE CLAMP LF (SET/KITS/TRAYS/PACK) ×5 IMPLANT
SET TUBE SMOKE EVAC HIGH FLOW (TUBING) ×5 IMPLANT
SOLUTION ELECTROLUBE (MISCELLANEOUS) ×5 IMPLANT
SPONGE LAP 18X18 RF (DISPOSABLE) ×5 IMPLANT
STAPLER INSORB 30 2030 C-SECTI (MISCELLANEOUS) ×5 IMPLANT
STAPLER PROXIMATE 75MM BLUE (STAPLE) ×5 IMPLANT
SUT CHROMIC 2-0 (SUTURE) ×10 IMPLANT
SUT DVC VLOC 180 0 12IN GS21 (SUTURE)
SUT MNCRL 4-0 (SUTURE) ×4
SUT MNCRL 4-0 27XMFL (SUTURE) ×6
SUT PDS AB 1 TP1 96 (SUTURE) ×10 IMPLANT
SUT SILK 0 (SUTURE) ×2
SUT SILK 0 30XBRD TIE 6 (SUTURE) ×3 IMPLANT
SUT SILK 2 0 SH CR/8 (SUTURE) ×10 IMPLANT
SUT SILK 2 0SH CR/8 30 (SUTURE) ×5 IMPLANT
SUT SILK 3-0 (SUTURE) ×15 IMPLANT
SUT VIC AB 0 CT1 36 (SUTURE) ×10 IMPLANT
SUT VIC AB 2-0 SH 27 (SUTURE) ×16
SUT VIC AB 2-0 SH 27XBRD (SUTURE) ×24 IMPLANT
SUTURE DVC VLC 180 0 12IN GS21 (SUTURE) IMPLANT
SUTURE MNCRL 4-0 27XMF (SUTURE) ×6 IMPLANT
SYR 10ML LL (SYRINGE) ×5 IMPLANT
SYR 30ML LL (SYRINGE) ×15 IMPLANT
WATER STERILE IRR 1000ML POUR (IV SOLUTION) ×10 IMPLANT

## 2019-07-22 NOTE — OR Nursing (Signed)
Lab at [facility] for additional blood drawl.

## 2019-07-22 NOTE — Anesthesia Procedure Notes (Signed)
Procedure Name: Intubation Performed by: Fredderick Phenix, CRNA Pre-anesthesia Checklist: Patient identified, Emergency Drugs available, Suction available and Patient being monitored Patient Re-evaluated:Patient Re-evaluated prior to induction Oxygen Delivery Method: Circle system utilized Preoxygenation: Pre-oxygenation with 100% oxygen Induction Type: IV induction Ventilation: Mask ventilation without difficulty Laryngoscope Size: Mac and 4 Grade View: Grade II Tube type: Oral Tube size: 7.0 mm Number of attempts: 1 Airway Equipment and Method: Stylet Placement Confirmation: ETT inserted through vocal cords under direct vision,  positive ETCO2 and breath sounds checked- equal and bilateral Tube secured with: Tape Dental Injury: Teeth and Oropharynx as per pre-operative assessment

## 2019-07-22 NOTE — Anesthesia Preprocedure Evaluation (Signed)
Anesthesia Evaluation  Patient identified by MRN, date of birth, ID band Patient awake    Reviewed: Allergy & Precautions, NPO status , Patient's Chart, lab work & pertinent test results  History of Anesthesia Complications Negative for: history of anesthetic complications  Airway Mallampati: III       Dental   Pulmonary neg sleep apnea, neg COPD, Not current smoker, former smoker,           Cardiovascular (-) hypertension(-) Past MI and (-) CHF (-) dysrhythmias (-) Valvular Problems/Murmurs     Neuro/Psych neg Seizures Anxiety    GI/Hepatic Neg liver ROS, neg GERD  ,  Endo/Other  neg diabetesHypothyroidism Morbid obesity  Renal/GU negative Renal ROS     Musculoskeletal   Abdominal   Peds  Hematology   Anesthesia Other Findings   Reproductive/Obstetrics                             Anesthesia Physical Anesthesia Plan  ASA: III  Anesthesia Plan: General   Post-op Pain Management:    Induction: Intravenous  PONV Risk Score and Plan: 3 and Ondansetron, Dexamethasone and Midazolam  Airway Management Planned: Oral ETT  Additional Equipment:   Intra-op Plan:   Post-operative Plan:   Informed Consent: I have reviewed the patients History and Physical, chart, labs and discussed the procedure including the risks, benefits and alternatives for the proposed anesthesia with the patient or authorized representative who has indicated his/her understanding and acceptance.       Plan Discussed with:   Anesthesia Plan Comments:         Anesthesia Quick Evaluation

## 2019-07-22 NOTE — Op Note (Signed)
07/22/2019  PATIENT:  Karen Aguirre  44 y.o. female  PRE-OPERATIVE DIAGNOSIS:  pelvic pressure, enlarging fibroid uterus  POST-OPERATIVE DIAGNOSIS:  pelvic pressure, enlarging fibroid uterus, significant adhesive disease, bowel injury.  PROCEDURE:  Procedure(s): XI ROBOTIC ASSISTED TOTAL HYSTERECTOMY WITH BILATERAL SALPINGECTOMY _ATTEMPTED (Bilateral) LAPAROTOMY (N/A) SMALL BOWEL RESECTION (N/A) HYSTERECTOMY ABDOMINAL WITH lbilateral SALPINGECTOMY, removal of left anterior cul de sac nodule, cystoscopy (Bilateral)  SURGEON:  Surgeon(s) and Role:    * Honorio Devol, Honor Loh, MD - Primary    * Fredirick Maudlin, MD - Assisting  Adrienne Allred, RNFA  ANESTHESIA: GET  EBL:  Total I/O In: 3800 [I.V.:3300; IV Piggyback:500] Out: 1200 [Urine:400; Blood:800]  DRAINS: foley to gravity   SPECIMEN: Uterus, Cervix, bilateral tubes Anterior culdesac nodule Portion of Ileum  DISPOSITION OF SPECIMEN:  To pathology  FINDINGS: 1. Cervix high and anterior in vagina with solid mass filling posterior culdesac and pelvis. 2. Small bowel densely adherent to anterior abdominal wall, consisting of several loops and one transversely adherent.   3. Significant pelvic adhesions, both filmy and dense 4. Dilated and edematous left fallopian tube 5. Normal ovaries bilaterally 6. Enlarged intramural fibroid uterus 7. Very deep pelvis, and prominent sacral promontory, flat pubic bone 8. 2 x 2cm nodule on left anterior culdesac peritoneum 9. Large mesenteric defect along ileum 10. Ileum bowel injury (during lysis of anterior wall / bowel adhesions) 11. Bilateral ureteral jets on cystoscopy and no evidence of bladder injury.  COUNTS: correct x2  COMPLICATIONS: none apparent  PATIENT DISPOSITION:  VS stable to PACU  Indication for surgery: Patient had presented with complaints of increased frequency of urine and pelvic pressure.  On exam she was found to have a large space feeling mass immediately  palpable posteriorly.  CT scan showed interval enlargement of previously known fibroids.  Various treatment options were discussed and patient requested a hysterectomy to resolve her symptoms.  Risks benefits and alternatives were reviewed and informed consent was obtained.   Procedure: The patient was brought to the OR and identified as Karen Aguirre.  She was given general anesthesia via endotracheal route.  Nasogastric tube was placed.  She was then positioned in the dorsal lithotomy position and prepped and draped in the usual sterile fashion.  A surgical time-out was called. A foley catheter was placed.  A speculum was placed in the vagina and the cervix was visualized, grasped with a single tooth tenaculum and the V-Care uterine manipulator was placed in and around the cervix.  After a change of gloves, the attention was turned to the abdomen.  A midline incision was made approximately 3 cm above the umbilicus.  The subcutaneous tissues were dissected, the fascia was divided, the peritoneum entered, and a robotic trochar was inserted. Intraabdominal location was confirmed and then pneumoperitoneum was created to 82mmHg.  Three additional robotic trochars were inserted atraumatically under visualization.  The patient was placed in steep trendelenburg, and the [employer] robot was brought to the field.  Upon targeting the robotic arms, the midline adhesions interfered with the ability to see the uterus.  Thus, before the robotic instruments were assembled, an attempt to lyse the anterior abdominal adhesions was made.  The left side was mostly omentum, and the right had evidence of organ involvement.  Very careful dissection with the cautery hook was made, and the majority of the bowel was dissected down without issue.  The transversely adherent bowel loop was obscured by the omentum on the left, and an enterotomy  was inadvertently made. Upon discovery of the injury, general surgery was asked to come to the  room for assistance and repair.  Dr. Celine Ahr evaluated the situation and recommended a midline incision in order to properly run the bowel and repair the enterotomy.  The remainder of the bowel was able to be dissected from the anterior abdominal wall without further injury.  While waiting for the OR staff and scrub crew to gather and count instruments for an open procedure, The bilateral utero-ovarian and round ligaments were cauterized and transected with the ligasure.  Various adhesions were ligated, and the uterine vessels on the left were cauterized.   Dr. Celine Ahr then scrubbed; we made a midline incision to the fascia, and then divided the fascia with care to avoid the umbilicus and bladder.  The peritoneum was already divided from the underlying lysis of adhesions, and the abdominal cavity was entered.  A balfour retractor was placed.  The bowel injury was identified in the distal ileum.  This was secured with silk ties and the bowel was run several times to look for other injuries.  A small serosal injury was noted nearer to the terminal ileum and was at first reinforced, and then included in the resected portion of small bowel.  The bowel was repaired in standard fashion, with 2.48mm load on GIA stapler, inserted through the mesentery of both ends of the portion of ligated bowel.  The ligasure was used to divide the mesentery, however it was friable and broke apart with even slight manipulation.  The side-to-side / end-to-end anastomosis was made and was both patent and intact. The bowel remained pink and appeared appropriately vascularized.  With the mesentery essentially disintegrating with any manipulation, the decision was made to keep the defect widely open, to avoid both further injury and devascularization of the bowel.  The bowel was run again twice, and no further defect was noted, and the anastomosis was properly oriented.  At this time, Dr. Celine Ahr scrubbed out of the case. The bowel was retracted  back to expose the uterus and pelvis. Due to the dramatic sacral curvature, the visualization was limited.  The Ligasure was used to cauterize and dissect the uterine vessels and cardinal ligaments bilaterally.  The cervix was able to be palpated but could not be safely reached or isolated with haney clamps.  Thus, it was transected with the bovie and the vagina was entered and traced around with the bovie and traction via surgeon's fingers in the vagina and around the cervix.  Once this was ligated, the decision was made to close the vaginal cuff vaginally, as the angle and visualization was poor for safe closure.   The attention was turned to the vagina, where retractors were inserted and the vaginal cuff was grasped with Alis clamps, and closed in several figures of eight.  Hemostasis was observed, and the attention was turned back to the abdomen after a change of gloves.   The cuff on the inside was not hemostatic, and 2-0 vicryl was used to ligate and reapproximate tissues for hemostasis.  This was achieved, and the ureters were palpated throughout to ensure they were not involved with the closure.  Once this area was hemostatic, the left fallopian tube was grasped and divided of of the ovary with the ligasure.  Water was introduced into the field for irrigation, and no bleeding areas were identified in the pelvis.  The bowel was released and there was noted to be blood within the various loops.  On further inspection, the mesentery was bleeding from the defect, near the root.  2-0 vicryl was used to both reapproximate the edges and for hemostasis.  Liberally spaced sutures were placed to join the edges the length of the defect.  Hemostasis was achieved here, and the bowel was observed and remained pink and healthy appearing.  Copious irrigation then commenced until the fluid was clear and free of debris. Prior to closure, a cystoscopy was performed by inserting a 70-degree scope into the bladder and  infusing 300cc normal saline.  The surface of the bladder was without defects and bilateral ureteral jets were observed.   Gloves were changed and attention was returned to the abdomen.  The bowel was once again inspected and remained pink and vascularized.  The fascia was closed with opposing running stiches of looped-PDS.  These were joined midline after the fascia closure was tested for integrity. The subcutaneous tissues were irrigated, and 90cc of short- and long-acting bupivacaine was injected into the fascia.  2-0 chromic and 3-0 vicrly were used to reapproximate the subcutaneous tissues.  The skin was closed with absorbable staples, except for at the umbilicus due to access.  This was closed with interrupted subdermal stiches of 4-0 monocryl.  The trochar sites were also closed with 4-0 monocryl.  All incisions were covered with surgical glue.  The vagina was inspected for lacerations and none were found. The procedure was then deemed complete.    The sponge, needle, and instrument counts were correct x2.  The patient tolerated reversal of anesthesia, and was brought to the PACU in a stable condition, where an abdominal binder was placed.   I was present and performed this case in its entirety.  Due to the complex nature of this case, the length of time in surgery was about 7 hours.    Larey Days, MD Attending Obstetrician and Gynecologist [facility]

## 2019-07-22 NOTE — H&P (Signed)
Preoperative History and Physical  Karen Aguirre is a 44 y.o.  here for surgical management of pelvic pain and pressure secondary to enlarging fibroid uterus.   No significant preoperative concerns.  Proposed surgery: Robotic-assisted total laparoscopic hysterectomy, bilateral salpingectomy   Past Medical History:  Diagnosis Date  . Anxiety   . Hypothyroidism   . Thyroid disease    Past Surgical History:  Procedure Laterality Date  . CHOLECYSTECTOMY    . ECTOPIC PREGNANCY SURGERY     OB History  No obstetric history on file.  Patient denies any other pertinent gynecologic issues.   No current facility-administered medications on file prior to encounter.   Current Outpatient Medications on File Prior to Encounter  Medication Sig Dispense Refill  . escitalopram (LEXAPRO) 20 MG tablet Take 20 mg by mouth daily.    Marland Kitchen levothyroxine (SYNTHROID, LEVOTHROID) 125 MCG tablet Take 125 mcg by mouth daily before breakfast.    . diclofenac (VOLTAREN) 75 MG EC tablet Take 1 tablet (75 mg total) by mouth 2 (two) times daily. (Patient not taking: Reported on 07/05/2019) 14 tablet 0   Allergies  Allergen Reactions  . Cobalt Hives    Social History:   reports that she has quit smoking. She has never used smokeless tobacco. She reports current alcohol use. She reports that she does not use drugs.  History reviewed. No pertinent family history.  Review of Systems: Noncontributory  PHYSICAL EXAM: Blood pressure 127/81, pulse (!) 101, temperature 98 F (36.7 C), temperature source Oral, resp. rate 17, height 5\' 3"  (1.6 m), weight 117.5 kg, last menstrual period 06/30/2019, SpO2 99 %. General appearance - alert, well appearing, and in no distress Chest - clear to auscultation, no wheezes, rales or rhonchi, symmetric air entry Heart - normal rate and regular rhythm Abdomen - soft, nontender, nondistended, no masses or organomegaly Pelvic - examination not indicated Extremities - peripheral  pulses normal, no pedal edema, no clubbing or cyanosis  Labs: Results for orders placed or performed during the hospital encounter of 07/22/19 (from the past 336 hour(s))  Pregnancy, urine POC   Collection Time: 07/22/19  1:12 PM  Result Value Ref Range   Preg Test, Ur NEGATIVE NEGATIVE  ABO/Rh   Collection Time: 07/22/19  1:54 PM  Result Value Ref Range   ABO/RH(D) PENDING   Results for orders placed or performed during the hospital encounter of 07/18/19 (from the past 336 hour(s))  Basic metabolic panel   Collection Time: 07/18/19 11:46 AM  Result Value Ref Range   Sodium 138 135 - 145 mmol/L   Potassium 4.0 3.5 - 5.1 mmol/L   Chloride 104 98 - 111 mmol/L   CO2 27 22 - 32 mmol/L   Glucose, Bld 123 (H) 70 - 99 mg/dL   BUN 8 6 - 20 mg/dL   Creatinine, Ser 0.64 0.44 - 1.00 mg/dL   Calcium 9.1 8.9 - 10.3 mg/dL   GFR calc non Af Amer >60 >60 mL/min   GFR calc Af Amer >60 >60 mL/min   Anion gap 7 5 - 15  CBC   Collection Time: 07/18/19 11:46 AM  Result Value Ref Range   WBC 8.7 4.0 - 10.5 K/uL   RBC 4.69 3.87 - 5.11 MIL/uL   Hemoglobin 13.8 12.0 - 15.0 g/dL   HCT 42.8 36.0 - 46.0 %   MCV 91.3 80.0 - 100.0 fL   MCH 29.4 26.0 - 34.0 pg   MCHC 32.2 30.0 - 36.0 g/dL   RDW  12.8 11.5 - 15.5 %   Platelets 387 150 - 400 K/uL   nRBC 0.0 0.0 - 0.2 %  Type and screen [place]   Collection Time: 07/18/19 11:46 AM  Result Value Ref Range   ABO/RH(D) O POS    Antibody Screen NEG    Sample Expiration 08/01/2019,2359    Extend sample reason      NO TRANSFUSIONS OR PREGNANCY IN THE PAST 3 MONTHS Performed at [facility], [address] [place], [place] [address]   SARS CORONAVIRUS 2 (TAT 6-24 HRS) Nasopharyngeal Nasopharyngeal Swab   Collection Time: 07/18/19 11:58 AM   Specimen: Nasopharyngeal Swab  Result Value Ref Range   SARS Coronavirus 2 NEGATIVE NEGATIVE    Imaging Studies: No results found.  Assessment: Patient Active Problem List    Diagnosis Date Noted  . Fibroid uterus 07/22/2019  . Pelvic pain 07/22/2019    Plan: Patient will undergo surgical management with Robotic-assisted total laparoscopic hysterectomy, bilateral salpingectomy.  The risks of surgery were discussed in detail with the patient including but not limited to: bleeding which may require transfusion or reoperation; infection which may require antibiotics; injury to surrounding organs which may involve bowel, bladder, ureters ; need for additional procedures including laparoscopy or laparotomy; thromboembolic phenomenon, surgical site problems and other postoperative/anesthesia complications. Likelihood of success in alleviating the patient's condition was discussed. Routine postoperative instructions will be reviewed with the patient and her family in detail after surgery.  The patient concurred with the proposed plan, giving informed written consent for the surgery.  Patient has been NPO since last night she will remain NPO for procedure.  Anesthesia and OR aware.  Preoperative prophylactic antibiotics and SCDs ordered on call to the OR.  To OR when ready.  ----- Larey Days, MD, [place] Attending Obstetrician and Gynecologist [facility], Department of [facility]  07/22/2019 2:25 PM

## 2019-07-22 NOTE — Transfer of Care (Signed)
Immediate Anesthesia Transfer of Care Note  Patient: Karen Aguirre  Procedure(s) Performed: XI ROBOTIC ASSISTED TOTAL HYSTERECTOMY WITH BILATERAL SALPINGECTOMY _ATTEMPTED (Bilateral ) LAPAROTOMY (N/A Abdomen) SMALL BOWEL RESECTION (N/A Abdomen) HYSTERECTOMY ABDOMINAL WITH lbilateral SALPINGECTOMY, removal of left anterior cul de sac nodule, cystoscopy (Bilateral Abdomen)  Patient Location: PACU  Anesthesia Type:General  Level of Consciousness: sedated and patient cooperative  Airway & Oxygen Therapy: Patient Spontanous Breathing and Patient connected to face mask oxygen  Post-op Assessment: Report given to RN and Post -op Vital signs reviewed and stable  Post vital signs: Reviewed and stable  Last Vitals:  Vitals Value Taken Time  BP 113/60 07/22/19 2300  Temp 36.3 C 07/22/19 2300  Pulse 105 07/22/19 2306  Resp 16 07/22/19 2306  SpO2 100 % 07/22/19 2306  Vitals shown include unvalidated device data.  Last Pain:  Vitals:   07/22/19 1700  TempSrc:   PainSc: 0-No pain         Complications: No apparent anesthesia complications

## 2019-07-22 NOTE — Op Note (Addendum)
Operative Note  Preoperative Diagnosis: Intraoperative enterotomy  Postoperative Diagnosis: Same, distal ileum  Operation: Exploratory laparotomy, adhesiolysis, partial small bowel resection with primary stapled side-to-side, functional end-to-end anastomosis  Surgeon: Fredirick Maudlin, MD  Assistant: Floyce Stakes, RNFA; Chelsea Ward, MD  Anesthesia: GETA  Findings: Significant adhesions in the right lower quadrant, consistent with the reported history of perforated appendicitis.  There was a large hole in the distal ileum consistent with the electrocautery injury described by Dr. Leonides Schanz.  The mesenteric tissues were extremely friable and both bled and tore with minimal manipulation.  There was also a small area of deserosalization on the terminal ileum which was included in our resection segment.  Indications: This was an intraoperative consult requested by Dr. Larey Days.  Briefly, this is a 44 year old woman who was undergoing a robot-assisted laparoscopic hysterectomy for fibroid uterus and pelvic pain.  During the course of the operation, Dr. Leonides Schanz noted what appeared to be enteric contents coming from an area of adhesions that she had been lysing.  I was called into the operating room to evaluate the situation.  I agreed that there was violation of the bowel and scrubbed into the case.    Procedure In Detail: The patient was taken out of the steep Trendelenburg she was in and a lower midline laparotomy incision was made.  The fascia was opened and with little difficulty, we entered the peritoneum; the majority of the adhesive disease have been dissected off of the abdominal wall by Dr. Leonides Schanz during the laparoscopic portion of the procedure.  A Balfour retractor was placed.  The patient had significant adiposity and therefore I extended the incision just above the umbilicus to provide better exposure.  The area of bowel injury was identified and temporarily closed with interrupted silk Lembert  sutures to minimize further contamination of the surgical field.  We then ran the small bowel multiple times from the terminal ileum and cecum back to the ligament of Treitz and vice versa.  A small area of deserosalization was appreciated and this was initially repaired with interrupted 3-0 silk Lembert sutures.  It was ultimately included in a resection.  Once I determined that there was no further bowel injury, a window was made in the mesentery and a 75 mm standard blue load GIA stapler was used to divide the bowel just distal to the injury.  As we were manipulating the bowel, the mesentery basically shredded in her fingers.  Several attempts to repair this were made however each suture simply tore through the tissues and created further bleeding.  Hemostasis was ultimately achieved with use of the LigaSure as well as silk stick ties.  An area of healthy bowel just proximal to the deserosalized tissue was selected and a second firing of the stapler and divided the bowel there.  The ends of the bowel were then brought together in a side-to-side, functional end-to-end fashion.  A crotch stitch was placed and the corners of the bowel excised.  A third firing of the GIA stapler created our anastomosis.  The lumen was widely patent.  The hole was then closed with a fourth firing of the stapler.  After inspecting the anastomosis, which appeared healthy and viable, I assessed the mesenteric defect.  It was very large and ran all the way down to the mesenteric root.  Given the friability of the tissues, and the size of the defect, I elected to leave the defect open, rather than attempt to close it and potentially compromise the  vasculature.  Once again, I inspected the anastomosis and it appeared viable and healthy.  I ran the bowel 1 more time to confirm that there were no twists or kinks and then turned the case back over to Dr. Leonides Schanz for her to complete the hysterectomy.  EBL: Approximately 30 cc for my portion of  the operation  IVF: See anesthesia record  Specimen(s): Distal ileum  Complications: none immediately apparent.  For my portion of the procedure  Counts: Please refer to Dr. Guido Sander operative documentation for counts   Fredirick Maudlin 6:16 PM

## 2019-07-23 LAB — CBC
HCT: 32.2 % — ABNORMAL LOW (ref 36.0–46.0)
Hemoglobin: 10.2 g/dL — ABNORMAL LOW (ref 12.0–15.0)
MCH: 29.6 pg (ref 26.0–34.0)
MCHC: 31.7 g/dL (ref 30.0–36.0)
MCV: 93.3 fL (ref 80.0–100.0)
Platelets: 312 10*3/uL (ref 150–400)
RBC: 3.45 MIL/uL — ABNORMAL LOW (ref 3.87–5.11)
RDW: 13 % (ref 11.5–15.5)
WBC: 11.6 10*3/uL — ABNORMAL HIGH (ref 4.0–10.5)
nRBC: 0 % (ref 0.0–0.2)

## 2019-07-23 LAB — BASIC METABOLIC PANEL
Anion gap: 11 (ref 5–15)
BUN: 9 mg/dL (ref 6–20)
CO2: 20 mmol/L — ABNORMAL LOW (ref 22–32)
Calcium: 7.7 mg/dL — ABNORMAL LOW (ref 8.9–10.3)
Chloride: 103 mmol/L (ref 98–111)
Creatinine, Ser: 0.71 mg/dL (ref 0.44–1.00)
GFR calc Af Amer: >60 mL/min (ref >60)
GFR calc non Af Amer: >60 mL/min (ref >60)
Glucose, Bld: 166 mg/dL — ABNORMAL HIGH (ref 70–99)
Potassium: 4.1 mmol/L (ref 3.5–5.1)
Sodium: 134 mmol/L — ABNORMAL LOW (ref 135–145)

## 2019-07-23 LAB — URINALYSIS, COMPLETE (UACMP) WITH MICROSCOPIC
Bilirubin Urine: NEGATIVE
Glucose, UA: NEGATIVE mg/dL
Ketones, ur: NEGATIVE mg/dL
Nitrite: NEGATIVE
Protein, ur: 30 mg/dL — AB
RBC / HPF: >50 RBC/hpf — ABNORMAL HIGH (ref 0–5)
Specific Gravity, Urine: 1.008 (ref 1.005–1.030)
pH: 6 (ref 5.0–8.0)

## 2019-07-23 MED ORDER — ENOXAPARIN SODIUM 40 MG/0.4ML ~~LOC~~ SOLN
40.0000 mg | Freq: Two times a day (BID) | SUBCUTANEOUS | Status: DC
  Administered 2019-07-23 – 2019-07-27 (×8): 40 mg via SUBCUTANEOUS
  Filled 2019-07-23 (×8): qty 0.4

## 2019-07-23 MED ORDER — FENTANYL CITRATE (PF) 100 MCG/2ML IJ SOLN
INTRAMUSCULAR | Status: AC
  Administered 2019-07-23: 25 ug via INTRAVENOUS
  Filled 2019-07-23: qty 2

## 2019-07-23 MED ORDER — MENTHOL 3 MG MT LOZG
1.0000 | LOZENGE | OROMUCOSAL | Status: DC | PRN
  Filled 2019-07-23: qty 9

## 2019-07-23 MED ORDER — ACETAMINOPHEN 500 MG PO TABS
1000.0000 mg | ORAL_TABLET | Freq: Four times a day (QID) | ORAL | Status: DC
  Administered 2019-07-23 – 2019-07-27 (×14): 1000 mg via ORAL
  Filled 2019-07-23 (×18): qty 2

## 2019-07-23 MED ORDER — LEVOTHYROXINE SODIUM 50 MCG PO TABS
125.0000 ug | ORAL_TABLET | Freq: Every day | ORAL | Status: DC
  Administered 2019-07-23 – 2019-08-01 (×5): 125 ug via ORAL
  Filled 2019-07-23 (×8): qty 1

## 2019-07-23 MED ORDER — ESCITALOPRAM OXALATE 10 MG PO TABS
20.0000 mg | ORAL_TABLET | Freq: Every day | ORAL | Status: DC
  Administered 2019-07-23 – 2019-08-16 (×10): 20 mg via ORAL
  Filled 2019-07-23 (×29): qty 2

## 2019-07-23 MED ORDER — PANTOPRAZOLE SODIUM 40 MG IV SOLR
40.0000 mg | Freq: Every day | INTRAVENOUS | Status: DC
  Administered 2019-07-23 – 2019-08-11 (×21): 40 mg via INTRAVENOUS
  Filled 2019-07-23 (×21): qty 40

## 2019-07-23 MED ORDER — ONDANSETRON HCL 4 MG/2ML IJ SOLN
4.0000 mg | Freq: Four times a day (QID) | INTRAMUSCULAR | Status: DC | PRN
  Administered 2019-07-23 – 2019-07-26 (×9): 4 mg via INTRAVENOUS
  Filled 2019-07-23 (×9): qty 2

## 2019-07-23 MED ORDER — KETOROLAC TROMETHAMINE 30 MG/ML IJ SOLN
30.0000 mg | Freq: Four times a day (QID) | INTRAMUSCULAR | Status: AC
  Administered 2019-07-23 (×4): 30 mg via INTRAVENOUS
  Filled 2019-07-23 (×4): qty 1

## 2019-07-23 MED ORDER — GABAPENTIN 300 MG PO CAPS: 300.0000 mg | ORAL_CAPSULE | Freq: Every day | ORAL | Status: DC

## 2019-07-23 MED ORDER — ONDANSETRON HCL 4 MG PO TABS: 4.0000 mg | ORAL_TABLET | Freq: Four times a day (QID) | ORAL | Status: DC | PRN

## 2019-07-23 MED ORDER — GABAPENTIN 300 MG PO CAPS
900.0000 mg | ORAL_CAPSULE | Freq: Every day | ORAL | Status: DC
  Administered 2019-07-23 – 2019-07-25 (×3): 900 mg via ORAL
  Filled 2019-07-23 (×3): qty 3

## 2019-07-23 MED ORDER — HYDROMORPHONE HCL 2 MG PO TABS
4.0000 mg | ORAL_TABLET | ORAL | Status: DC | PRN
  Administered 2019-07-24: 4 mg via ORAL
  Filled 2019-07-23: qty 2

## 2019-07-23 MED ORDER — CHLORHEXIDINE GLUCONATE CLOTH 2 % EX PADS
6.0000 | MEDICATED_PAD | Freq: Every day | CUTANEOUS | Status: DC
  Administered 2019-07-23 – 2019-07-26 (×4): 6 via TOPICAL

## 2019-07-23 MED ORDER — IBUPROFEN 400 MG PO TABS
800.0000 mg | ORAL_TABLET | Freq: Four times a day (QID) | ORAL | Status: DC
  Administered 2019-07-24 (×3): 800 mg via ORAL
  Filled 2019-07-23 (×3): qty 2

## 2019-07-23 MED ORDER — HYDROMORPHONE HCL 1 MG/ML IJ SOLN
0.2000 mg | INTRAMUSCULAR | Status: DC | PRN
  Administered 2019-07-23 (×3): 0.6 mg via INTRAVENOUS
  Administered 2019-07-25: 0.2 mg via INTRAVENOUS
  Administered 2019-07-26: 0.5 mg via INTRAVENOUS
  Administered 2019-07-26 (×3): 0.2 mg via INTRAVENOUS
  Administered 2019-07-27 (×2): 0.5 mg via INTRAVENOUS
  Filled 2019-07-23 (×10): qty 1

## 2019-07-23 MED ORDER — SODIUM CHLORIDE 0.9 % IV SOLN: Freq: Once | INTRAVENOUS | Status: AC

## 2019-07-23 MED ORDER — OXYCODONE HCL 5 MG PO TABS
5.0000 mg | ORAL_TABLET | ORAL | Status: DC | PRN
  Administered 2019-07-23: 10 mg via ORAL
  Filled 2019-07-23: qty 2

## 2019-07-23 MED ORDER — SIMETHICONE 80 MG PO CHEW
160.0000 mg | CHEWABLE_TABLET | Freq: Four times a day (QID) | ORAL | Status: DC | PRN
  Filled 2019-07-23: qty 2

## 2019-07-23 MED ORDER — LACTATED RINGERS IV SOLN: INTRAVENOUS | Status: DC

## 2019-07-23 MED ORDER — HYDROMORPHONE HCL 2 MG PO TABS
2.0000 mg | ORAL_TABLET | ORAL | Status: DC | PRN
  Administered 2019-07-23 – 2019-07-24 (×4): 2 mg via ORAL
  Filled 2019-07-23 (×4): qty 1

## 2019-07-23 NOTE — Anesthesia Postprocedure Evaluation (Signed)
Anesthesia Post Note  Patient: Karen Aguirre  Procedure(s) Performed: XI ROBOTIC ASSISTED TOTAL HYSTERECTOMY WITH BILATERAL SALPINGECTOMY _ATTEMPTED (Bilateral ) LAPAROTOMY (N/A Abdomen) SMALL BOWEL RESECTION (N/A Abdomen) HYSTERECTOMY ABDOMINAL WITH lbilateral SALPINGECTOMY, removal of left anterior cul de sac nodule, cystoscopy (Bilateral Abdomen)  Patient location during evaluation: PACU Anesthesia Type: General Level of consciousness: awake and alert Pain management: pain level controlled Vital Signs Assessment: post-procedure vital signs reviewed and stable Respiratory status: spontaneous breathing, nonlabored ventilation and respiratory function stable Cardiovascular status: blood pressure returned to baseline and stable Postop Assessment: no apparent nausea or vomiting Anesthetic complications: no     Last Vitals:  Vitals:   07/23/19 0235 07/23/19 0426  BP: (!) 95/48 106/64  Pulse: 90 96  Resp: 18 20  Temp: 36.7 C 36.5 C  SpO2: 96% 96%    Last Pain:  Vitals:   07/23/19 0426  TempSrc: Oral  PainSc:                  Tera Mater

## 2019-07-23 NOTE — Progress Notes (Signed)
Order received from Dr Leonides Schanz that the patient could have icee's along with ice chips

## 2019-07-23 NOTE — Progress Notes (Addendum)
[place] Hospital Day(s): 1.   Post op day(s): 1 Day Post-Op.   Interval History:  Patient seen and examined no acute events or new complaints overnight.  Patient reports biggest issue is pain at incision, some nausea No fever, chills, no emesis Renal function is normal, UO - 1.2L, although urine appears very dark and somewhat murky On clear liquids, no flatus  Vital signs in last 24 hours: [min-max] current  Temp:  [97.3 F (36.3 C)-98.3 F (36.8 C)] 97.8 F (36.6 C) (03/09 1204) Pulse Rate:  [90-107] 107 (03/09 1204) Resp:  [12-20] 20 (03/09 0426) BP: (95-122)/(48-85) 101/73 (03/09 1204) SpO2:  [95 %-100 %] 96 % (03/09 1204) Weight:  [133.3 kg] 133.3 kg (03/09 0108)     Height: 5\' 2"  (157.5 cm) Weight: 133.3 kg BMI (Calculated): 53.74   Intake/Output last 2 shifts:  03/08 0701 - 03/09 0700 In: 6650 [I.V.:5600; IV Piggyback:1050] Out: 3075 [Urine:1275; Blood:1800]   Physical Exam:  Constitutional: alert, cooperative and no distress  Respiratory: breathing non-labored at rest  Cardiovascular: regular rate and sinus rhythm  Gastrointestinal: soft, incisional soreness, and non-distended, no rebound or guarding Genitourinary: Foley in place, tan-colored murky appearing urine Integumentary: Incision is CDI, sutured, no erythema or drainage, covered with abdominal binder   Labs:  CBC Latest Ref Rng & Units 07/23/2019 07/22/2019 07/18/2019  WBC 4.0 - 10.5 K/uL 11.6(H) 16.2(H) 8.7  Hemoglobin 12.0 - 15.0 g/dL 10.2(L) 10.5(L) 13.8  Hematocrit 36.0 - 46.0 % 32.2(L) 31.8(L) 42.8  Platelets 150 - 400 K/uL 312 320 387   CMP Latest Ref Rng & Units 07/23/2019 07/18/2019  Glucose 70 - 99 mg/dL 166(H) 123(H)  BUN 6 - 20 mg/dL 9 8  Creatinine 0.44 - 1.00 mg/dL 0.71 0.64  Sodium 135 - 145 mmol/L 134(L) 138  Potassium 3.5 - 5.1 mmol/L 4.1 4.0  Chloride 98 - 111 mmol/L 103 104  CO2 22 - 32 mmol/L 20(L) 27  Calcium 8.9 - 10.3 mg/dL 7.7(L) 9.1      Imaging studies: No new pertinent imaging studies   Assessment/Plan:  44 y.o. female awaiting return of bowel function 1 Day Post-Op s/p exploratory laparotomy and repair of small bowel enterotomy as well as hysterectomy with OB/GYN   - Continue CLD; would not rush to advance diet  - Continue aggressive IVF resuscitation; will bolus 1L NS given darker urine   - Monitor CBC; BMP  - Pain control prn; antiemetics prn  - Monitor abdominal examination; on-going bowel function  - Encouraged mobilization as tolerable  - Further management per OB/GYN; we will follow along  All of the above findings and recommendations were discussed with the patient, and the medical team, and all of patient's questions were answered to her expressed satisfaction.  -- Edison Simon, PA-C [facility] 07/23/2019, 1:26 PM [phone] M-F: 7am - 4pm  I saw and evaluated the patient.  I agree with the above documentation, exam, and plan, which I have edited where appropriate. Fredirick Maudlin  5:46 PM

## 2019-07-23 NOTE — Progress Notes (Signed)
Obstetric and Gynecology  POD 1  Subjective   Patient complains of suboptimal pain control, mid-abdominal pain. She is tired.  Has tried to ambulate twice overnight, once just standing and next walked to door, both times complained of dizzyness/lightheaded.   She has not yet attempted to void.  Denies CP, SOB, F/C, N/V/D, or leg pain.  Husband, Lysbeth Galas is at bedside.  Objective  Objective:   Vitals:   07/23/19 0140 07/23/19 0235 07/23/19 0426 07/23/19 1204  BP: 111/62 (!) 95/48 106/64 101/73  Pulse: 98 90 96 (!) 107  Resp: 20 18 20    Temp: 98.1 F (36.7 C) 98.1 F (36.7 C) 97.7 F (36.5 C) 97.8 F (36.6 C)  TempSrc: Oral Axillary Oral   SpO2: 99% 96% 96% 96%  Weight:      Height:       Intake/Output Summary (Last 24 hours) at 07/23/2019 1314 Last data filed at 07/23/2019 1200 Gross per 24 hour  Intake 7523.37 ml  Output 3275 ml  Net 4248.37 ml   General: NAD Cardiovascular: RRR, no murmurs Pulmonary: CTAB, normal respiratory effort Abdomen: appropriately tender, moderately distended, +BS, no guarding. Incision: c/d/i, covered by abdominal binder.   Extremities: No erythema or cords, no calf tenderness, with normal peripheral pulses. Foley: amber cloudy urine.   Labs: Results for orders placed or performed during the hospital encounter of 07/22/19 (from the past 24 hour(s))  ABO/Rh     Status: None   Collection Time: 07/22/19  2:24 PM  Result Value Ref Range   ABO/RH(D)      O POS Performed at [facility], [address] [place] [address], [place] [address]   CBC     Status: Abnormal   Collection Time: 07/22/19  8:40 PM  Result Value Ref Range   WBC 16.2 (H) 4.0 - 10.5 K/uL   RBC 3.54 (L) 3.87 - 5.11 MIL/uL   Hemoglobin 10.5 (L) 12.0 - 15.0 g/dL   HCT 31.8 (L) 36.0 - 46.0 %   MCV 89.8 80.0 - 100.0 fL   MCH 29.7 26.0 - 34.0 pg   MCHC 33.0 30.0 - 36.0 g/dL   RDW 12.9 11.5 - 15.5 %   Platelets 320 150 - 400 K/uL   nRBC 0.0 0.0 - 0.2 %  CBC     Status:  Abnormal   Collection Time: 07/23/19  4:34 AM  Result Value Ref Range   WBC 11.6 (H) 4.0 - 10.5 K/uL   RBC 3.45 (L) 3.87 - 5.11 MIL/uL   Hemoglobin 10.2 (L) 12.0 - 15.0 g/dL   HCT 32.2 (L) 36.0 - 46.0 %   MCV 93.3 80.0 - 100.0 fL   MCH 29.6 26.0 - 34.0 pg   MCHC 31.7 30.0 - 36.0 g/dL   RDW 13.0 11.5 - 15.5 %   Platelets 312 150 - 400 K/uL   nRBC 0.0 0.0 - 0.2 %  Basic metabolic panel     Status: Abnormal   Collection Time: 07/23/19  4:34 AM  Result Value Ref Range   Sodium 134 (L) 135 - 145 mmol/L   Potassium 4.1 3.5 - 5.1 mmol/L   Chloride 103 98 - 111 mmol/L   CO2 20 (L) 22 - 32 mmol/L   Glucose, Bld 166 (H) 70 - 99 mg/dL   BUN 9 6 - 20 mg/dL   Creatinine, Ser 0.71 0.44 - 1.00 mg/dL   Calcium 7.7 (L) 8.9 - 10.3 mg/dL   GFR calc non Af Amer >60 >60 mL/min  GFR calc Af Amer >60 >60 mL/min   Anion gap 11 5 - 15    Cultures: Results for orders placed or performed during the hospital encounter of 07/18/19  SARS CORONAVIRUS 2 (TAT 6-24 HRS) Nasopharyngeal Nasopharyngeal Swab     Status: None   Collection Time: 07/18/19 11:58 AM   Specimen: Nasopharyngeal Swab  Result Value Ref Range Status   SARS Coronavirus 2 NEGATIVE NEGATIVE Final    Comment: (NOTE) SARS-CoV-2 target nucleic acids are NOT DETECTED. The SARS-CoV-2 RNA is generally detectable in upper and lower respiratory specimens during the acute phase of infection. Negative results do not preclude SARS-CoV-2 infection, do not rule out co-infections with other pathogens, and should not be used as the sole basis for treatment or other patient management decisions. Negative results must be combined with clinical observations, patient history, and epidemiological information. The expected result is Negative. Fact Sheet for Patients: SugarRoll.be Fact Sheet for Healthcare Providers: https://www.woods-mathews.com/ This test is not yet approved or cleared by the [place] FDA  and  has been authorized for detection and/or diagnosis of SARS-CoV-2 by FDA under an Emergency Use Authorization (EUA). This EUA will remain  in effect (meaning this test can be used) for the duration of the COVID-19 declaration under Section 56 4(b)(1) of the Act, 21 U.S.C. section 360bbb-3(b)(1), unless the authorization is terminated or revoked sooner. Performed at [facility], [place] [place], [place] [address]     Imaging: DG Abd 1 View  Result Date: 07/22/2019 CLINICAL DATA:  Evaluate for possible retained sponge EXAM: ABDOMEN - 1 VIEW COMPARISON:  None. FINDINGS: Scattered large and small bowel gas is noted. No abnormal mass or abnormal calcifications are seen. Postsurgical changes are noted. No radiopaque foreign body is identified to suggest retained sponge. IMPRESSION: No evidence of retained foreign body. These results will be called to the ordering clinician or representative by the Radiologist Assistant, and communication documented in the PACS or zVision Dashboard. Electronically Signed   By: Inez Catalina M.D.   On: 07/22/2019 22:49     Assessment   44 y.o.. Hospital Day: 2 Postoperative day 1 from a laparoscopic converted to open total hysterectomy, left salpingectomy, cystoscopy, and small bowel resection  Plan   1. FEN: on IV fluids, will bolus and increase rate for both soft BPs and darker urine with borderline UOP.  Continue sips per Gen Surg.    2. Pain control:  On toradol + tylenol, oxycodone 10mg  with IV dilauded for breakthrough.  Changing to 2-4mg  dilauded PO q3hrs, with continue IV for breakthrough.  Continue abdominal binder use. Will also increase PM gabapentin to 900.   4. Postop: continue attempts to get OOB. Hopefully with altered pain med regimen and adequate fluid resuscitation she will have more strength and less lightheadedness upon standing.    5. Acute blood loss anemia:  Stable.  She has not and does not need to receive blood  products.   6. Bowel function:  Awaiting return, will likely be slow progression.  Important not to push intake to avoid ileus.  She is at high risk for this due to bowel manipulation in surgery.  7. Mental health:  Continue lexapro daily, she may benefit from visit with chaplain or other community support person   8. DVT prophylaxis:  SCDs and Lovenox BID - was to start this evening but when changed to BID it was released to start this morning.  Do not anticipate an issue with this, was  being cautionary with the evening 1st dose order.  Continue as scheduled.  ----- Larey Days, MD, [place] Attending Obstetrician and Gynecologist [facility], Department of [facility] Page:  [phone]

## 2019-07-23 NOTE — Progress Notes (Signed)
Walked to door. C/o dizziness. Walked back to bed.

## 2019-07-23 NOTE — Progress Notes (Signed)
Admitted to 219 post op. A/O. Binder intact. Abd. Incision edges together and glue intact. O2 2 l/[place]. Explained Cough and deep breath and IS.

## 2019-07-23 NOTE — Progress Notes (Signed)
Assisted up to bedside, patient became dizzy and had to lay back down. We will try to walk again in 45 min.

## 2019-07-23 NOTE — Progress Notes (Signed)
PHARMACIST - PHYSICIAN COMMUNICATION  CONCERNING:  Enoxaparin (Lovenox) for DVT Prophylaxis    RECOMMENDATION: Patient was prescribed enoxaprin 40mg  q24 hours for VTE prophylaxis.   Filed Weights   07/22/19 1316 07/23/19 0108  Weight: 259 lb 0.7 oz (117.5 kg) 293 lb 14 oz (133.3 kg)    Body mass index is 53.75 kg/m.  Estimated Creatinine Clearance: 121.8 mL/min (by C-G formula based on SCr of 0.71 mg/dL).   Based on [place] patient is candidate for enoxaparin 40mg  every 12 hour dosing due to BMI being >40.  DESCRIPTION: Pharmacy has adjusted enoxaparin dose per [facility] policy.  Patient is now receiving enoxaparin 40mg  every 12 hours.    Dallie Piles, PharmD Clinical Pharmacist  07/23/2019 8:43 AM

## 2019-07-24 LAB — URINE CULTURE: Culture: NO GROWTH

## 2019-07-24 LAB — SURGICAL PATHOLOGY

## 2019-07-24 MED ORDER — HYDROMORPHONE HCL 2 MG PO TABS
2.0000 mg | ORAL_TABLET | ORAL | Status: DC
  Administered 2019-07-24 – 2019-07-25 (×4): 2 mg via ORAL
  Filled 2019-07-24 (×5): qty 1

## 2019-07-24 MED ORDER — KETOROLAC TROMETHAMINE 30 MG/ML IJ SOLN
30.0000 mg | Freq: Four times a day (QID) | INTRAMUSCULAR | Status: DC
  Administered 2019-07-24 – 2019-07-27 (×10): 30 mg via INTRAVENOUS
  Filled 2019-07-24 (×10): qty 1

## 2019-07-24 MED ORDER — SIMETHICONE 80 MG PO CHEW
320.0000 mg | CHEWABLE_TABLET | Freq: Four times a day (QID) | ORAL | Status: DC | PRN
  Administered 2019-07-26: 21:00:00 160 mg via ORAL
  Administered 2019-07-27: 320 mg via ORAL
  Filled 2019-07-24 (×3): qty 4

## 2019-07-24 NOTE — Progress Notes (Addendum)
New serosanguinous drainage from midline abdominal  incision. ABD pad applied and new abdominal binder ordered.

## 2019-07-24 NOTE — Progress Notes (Addendum)
[place] Hospital Day(s): 2.   Post op day(s): 2 Days Post-Op.   Interval History:  Patient seen and examined no acute events or new complaints overnight.  Patient reports her incisional abdominal pain has improved some today No fever or chills No flatus No new labs this morning Good UO - 1.1 L in last 24 hours She plans to mobilize more today She has tolerated CLD (Icees and Ice Chips)   Vital signs in last 24 hours: [min-max] current  Temp:  [97.8 F (36.6 C)-99.2 F (37.3 C)] 98.5 F (36.9 C) (03/10 0504) Pulse Rate:  [107-124] 113 (03/10 0504) Resp:  [18-20] 18 (03/10 0504) BP: (101-109)/(50-73) 108/50 (03/10 0504) SpO2:  [93 %-96 %] 96 % (03/10 0504)     Height: 5\' 2"  (157.5 cm) Weight: 133.3 kg BMI (Calculated): 53.74   Intake/Output last 2 shifts:  03/09 0701 - 03/10 0700 In: 3404.9 [P.O.:200; I.V.:3204.9] Out: 1150 [Urine:1150]   Physical Exam:  Constitutional: alert, cooperative and no distress  Respiratory: breathing non-labored at rest  Cardiovascular: tachycardic and sinus rhythm  Gastrointestinal: soft, incisional soreness, and non-distended, no rebound or guarding Genitourinary: Foley in place, dark urine in bag Integumentary: Incision is CDI, sutured, no erythema, there is some serosanguinous drainage on ABD pad (I replaced this), binder in place   Labs:  CBC Latest Ref Rng & Units 07/23/2019 07/22/2019 07/18/2019  WBC 4.0 - 10.5 K/uL 11.6(H) 16.2(H) 8.7  Hemoglobin 12.0 - 15.0 g/dL 10.2(L) 10.5(L) 13.8  Hematocrit 36.0 - 46.0 % 32.2(L) 31.8(L) 42.8  Platelets 150 - 400 K/uL 312 320 387   CMP Latest Ref Rng & Units 07/23/2019 07/18/2019  Glucose 70 - 99 mg/dL 166(H) 123(H)  BUN 6 - 20 mg/dL 9 8  Creatinine 0.44 - 1.00 mg/dL 0.71 0.64  Sodium 135 - 145 mmol/L 134(L) 138  Potassium 3.5 - 5.1 mmol/L 4.1 4.0  Chloride 98 - 111 mmol/L 103 104  CO2 22 - 32 mmol/L 20(L) 27  Calcium 8.9 - 10.3 mg/dL 7.7(L) 9.1      Imaging studies: No new pertinent imaging studies   Assessment/Plan: 44 y.o. female awaiting return of bowel function 2 Days Post-Op s/p exploratory laparotomy and repair of small bowel enterotomy as well as hysterectomy with OB/GYN   - Continue CLD; would not rush to advance diet             - Continue aggressive IVF resuscitation; monitor UO  - I would continue Foley until urine lightens up some             - Monitor CBC; BMP             - Pain control prn; antiemetics prn             - Monitor abdominal examination; on-going bowel function             - Encouraged mobilization as tolerable             - Further management per OB/GYN; we will follow along  All of the above findings and recommendations were discussed with the patient, and the medical team, and all of patient's questions were answered to her expressed satisfaction.  -- Edison Simon, PA-C [facility] 07/24/2019, 7:49 AM [phone] M-F: 7am - 4pm  I saw and evaluated the patient.  I agree with the above documentation, exam, and plan, which I have edited where appropriate. Fredirick Maudlin  8:35 PM

## 2019-07-24 NOTE — Progress Notes (Addendum)
Obstetric and Gynecology  POD 1  Subjective   Patient complains of suboptimal pain control, mid-abdominal pain, slightly improved from yesterday.  She does not like to take PO meds.  She still has her foley after a bolus and bump in fluids, and has had good UOP. Today she has had some serosanguinous fluid from her midline incision, and abd pad placed/replaced by surgery team, and binder still in place.    Some ambulation today with nursing, walker.  Still somewhat lightheaded with ambulation.    Tachycardic throughout the day, increases with pain.    She has not yet attempted to void.  Denies CP, SOB, F/C, N/V/D, or leg pain.  Husband, Lysbeth Galas is at bedside.  Objective  Objective:   Vitals:   07/24/19 0159 07/24/19 0504 07/24/19 1202 07/24/19 1745  BP: (!) 104/54 (!) 108/50 122/66 123/77  Pulse: (!) 118 (!) 113 (!) 113 (!) 115  Resp: 20 18    Temp: 98.8 F (37.1 C) 98.5 F (36.9 C) 98.9 F (37.2 C)   TempSrc:  Oral Oral   SpO2: 93% 96% 99% 97%  Weight:      Height:        Intake/Output Summary (Last 24 hours) at 07/24/2019 1811 Last data filed at 07/24/2019 1731 Gross per 24 hour  Intake 2773.9 ml  Output 1850 ml  Net 923.9 ml   General: NAD Cardiovascular: RRR, no murmurs Pulmonary: CTAB, normal respiratory effort Abdomen: appropriately tender, moderately distended, +BS, no guarding.  Incision: c/d/i, covered by abdominal binder.   Extremities: No erythema or cords, no calf tenderness, with normal peripheral pulses. Foley: amber cloudy urine.   Labs: No results found for this or any previous visit (from the past 24 hour(s)).  Cultures: Results for orders placed or performed during the hospital encounter of 07/22/19  Urine Culture     Status: None   Collection Time: 07/23/19  4:00 PM   Specimen: Urine, Catheterized  Result Value Ref Range Status   Specimen Description   Final    URINE, CATHETERIZED Performed at [facility], [address],  [place], [place] [address]    Special Requests   Final    NONE Performed at [facility], [address] [place] [address]    Culture   Final    NO GROWTH Performed at [facility], [place] [address] [place],  [address]    Report Status 07/24/2019 FINAL  Final    Imaging: DG Abd 1 View  Result Date: 07/22/2019 CLINICAL DATA:  Evaluate for possible retained sponge EXAM: ABDOMEN - 1 VIEW COMPARISON:  None. FINDINGS: Scattered large and small bowel gas is noted. No abnormal mass or abnormal calcifications are seen. Postsurgical changes are noted. No radiopaque foreign body is identified to suggest retained sponge. IMPRESSION: No evidence of retained foreign body. These results will be called to the ordering clinician or representative by the Radiologist Assistant, and communication documented in the PACS or zVision Dashboard. Electronically Signed   By: Inez Catalina M.D.   On: 07/22/2019 22:49     Assessment   44 y.o.. Hospital Day: 3 Postoperative day 2 from a laparoscopic converted to open total hysterectomy, left salpingectomy, cystoscopy, and small bowel resection  Plan   1. FEN: on IV fluids, s/p bolus and increase rate with improvement of both BPs and UOP.  Foley removed.    Continue sips per Gen Surg.    2. Pain control:  Not optimized, but getting  there. On toradol + tylenol, oxycodone 10mg  with IV dilauded for breakthrough.  Changing to 2-4mg  dilauded PO q3hrs, with continue IV for breakthrough.  Continue abdominal binder use. Will also increase PM gabapentin to 900. Patient does not like PO meds.  2a.  Having tachycardia, likely secondary to pain as BP and other vitals indicate hemodynamically stability.  Will get EKG to verify no other abnormal rhythm or waveform.  If any identified will consult cardiology.    4. Postop: continue attempts to get OOB. Hopefully with altered pain med regimen and adequate fluid resuscitation she will have more strength and  less lightheadedness upon standing.    5. Acute blood loss anemia:  Stable.  She has not and does not need to receive blood products at this time.  Plan for blood draw tomorrow AM.   6. Bowel function:  Awaiting return, will likely be slow progression.  Important not to push intake to avoid ileus.  She is at high risk for this due to bowel manipulation in surgery.  Simethicone available for   7. Mental health:  Continue lexapro daily, she may benefit from visit with chaplain or other community support person   8. DVT prophylaxis:  SCDs and Lovenox BID, will be ambulating more often after foley removed today.   Continue inpatient admission   ----- Larey Days, MD, [place] Attending Obstetrician and Gynecologist [facility], Department of [facility] Page:  [phone]    35 minutes total time spent on face to face, reviewing labs other notes, etc., and writing this note.

## 2019-07-25 LAB — BASIC METABOLIC PANEL
Anion gap: 7 (ref 5–15)
BUN: 8 mg/dL (ref 6–20)
CO2: 26 mmol/L (ref 22–32)
Calcium: 7.8 mg/dL — ABNORMAL LOW (ref 8.9–10.3)
Chloride: 104 mmol/L (ref 98–111)
Creatinine, Ser: 0.69 mg/dL (ref 0.44–1.00)
GFR calc Af Amer: >60 mL/min (ref >60)
GFR calc non Af Amer: >60 mL/min (ref >60)
Glucose, Bld: 105 mg/dL — ABNORMAL HIGH (ref 70–99)
Potassium: 4.4 mmol/L (ref 3.5–5.1)
Sodium: 137 mmol/L (ref 135–145)

## 2019-07-25 LAB — PREPARE RBC (CROSSMATCH)

## 2019-07-25 LAB — CBC
HCT: 25.1 % — ABNORMAL LOW (ref 36.0–46.0)
Hemoglobin: 7.9 g/dL — ABNORMAL LOW (ref 12.0–15.0)
MCH: 29.5 pg (ref 26.0–34.0)
MCHC: 31.5 g/dL (ref 30.0–36.0)
MCV: 93.7 fL (ref 80.0–100.0)
Platelets: 293 10*3/uL (ref 150–400)
RBC: 2.68 MIL/uL — ABNORMAL LOW (ref 3.87–5.11)
RDW: 13.4 % (ref 11.5–15.5)
WBC: 17.7 10*3/uL — ABNORMAL HIGH (ref 4.0–10.5)
nRBC: 0 % (ref 0.0–0.2)

## 2019-07-25 LAB — SAMPLE TO BLOOD BANK

## 2019-07-25 MED ORDER — SODIUM CHLORIDE 0.9% IV SOLUTION: Freq: Once | INTRAVENOUS | Status: DC

## 2019-07-25 MED ORDER — DIPHENHYDRAMINE HCL 25 MG PO CAPS
25.0000 mg | ORAL_CAPSULE | Freq: Once | ORAL | Status: AC
  Administered 2019-07-25: 12:00:00 25 mg via ORAL
  Filled 2019-07-25: qty 1

## 2019-07-25 NOTE — Progress Notes (Addendum)
[place] Hospital Day(s): 3.   Post op day(s): 3 Days Post-Op.   Interval History:  Patient seen and examined no acute events or new complaints overnight.  Patient reports she is feeling better this morning, incisional pain improving No fever or chills, no emesis Increase in leukocytosis to 17.7K (11.6 yesterday); afebrile Hemoglobin down to 7.9, likely some dilutional component; although she is tachycardic Renal function normal, UO - 1.6L Remained on sips of clears; no flatus but feels rumbling Mobilizing well   Vital signs in last 24 hours: [min-max] current  Temp:  [97.9 F (36.6 C)-99.6 F (37.6 C)] 97.9 F (36.6 C) (03/11 0438) Pulse Rate:  [109-115] 109 (03/11 0438) Resp:  [20] 20 (03/11 0438) BP: (111-123)/(59-77) 111/66 (03/11 0438) SpO2:  [95 %-99 %] 95 % (03/11 0438)     Height: 5\' 2"  (157.5 cm) Weight: 133.3 kg BMI (Calculated): 53.74   Intake/Output last 2 shifts:  03/10 0701 - 03/11 0700 In: 3340.4 [P.O.:50; I.V.:3290.4] Out: 1600 [Urine:1600]   Physical Exam:  Constitutional: alert, cooperative and no distress  Respiratory: breathing non-labored at rest  Cardiovascular: tachycardic and sinus rhythm  Gastrointestinal: soft, incisional soreness, and non-distended, no rebound or guarding Genitourinary: Foley removed yesterday Integumentary: Incision is CDI, sutured, no erythema, binder in place    Labs:  CBC Latest Ref Rng & Units 07/25/2019 07/23/2019 07/22/2019  WBC 4.0 - 10.5 K/uL 17.7(H) 11.6(H) 16.2(H)  Hemoglobin 12.0 - 15.0 g/dL 7.9(L) 10.2(L) 10.5(L)  Hematocrit 36.0 - 46.0 % 25.1(L) 32.2(L) 31.8(L)  Platelets 150 - 400 K/uL 293 312 320   CMP Latest Ref Rng & Units 07/25/2019 07/23/2019 07/18/2019  Glucose 70 - 99 mg/dL 105(H) 166(H) 123(H)  BUN 6 - 20 mg/dL 8 9 8   Creatinine 0.44 - 1.00 mg/dL 0.69 0.71 0.64  Sodium 135 - 145 mmol/L 137 134(L) 138  Potassium 3.5 - 5.1 mmol/L 4.4 4.1 4.0  Chloride 98 - 111  mmol/L 104 103 104  CO2 22 - 32 mmol/L 26 20(L) 27  Calcium 8.9 - 10.3 mg/dL 7.8(L) 7.7(L) 9.1     Imaging studies: No new pertinent imaging studies   Assessment/Plan:  44 y.o. female awaiting return of bowel fucntion 3 Days Post-Op s/p exploratory laparotomy and repair of small bowel enterotomy as well as hysterectomy with OB/GYN   - Continue CLD; would not rush to advance diet until having clear evidence of bowel function  - Monitor hgb; transfuse as needed --> plan for 1 unit pRBCs per OB/GYN              - Monitor leukocytosis; may need to scan if persists, due to soilage of abdomen during hysterectomy.  - Pain control prn; antiemetics prn             - Monitor abdominal examination; on-going bowel function             - Encouraged mobilization as tolerable  - May consider holding chemical DVT prophylaxis if concern for blood loss-related anemia             - Further management per OB/GYN; we will follow along  All of the above findings and recommendations were discussed with the patient, and the medical team, and all of patient's questions were answered to her expressed satisfaction.  -- Edison Simon, PA-C [facility] 07/25/2019, 7:52 AM [phone] M-F: 7am - 4pm  I saw and evaluated the patient.  I agree with the above documentation, exam, and  plan, which I have edited where appropriate. Zanai Mallari  1:32 PM

## 2019-07-25 NOTE — Progress Notes (Signed)
"  I am having weird dreams. I want to hold off on the strong pain medicine right now."

## 2019-07-25 NOTE — Progress Notes (Signed)
Obstetric and Gynecology  POD 3  Subjective   Patient reports pain improved from yesterday.  Ambulation yesterday with nursing, walker.  Still somewhat lightheaded with ambulation.    Abdominal distention has improved with time, simethicone, and ambulation.  No flatus.  No increase in belching.   Tachycardic throughout the day, increases with pain.  EKG yesterday showed tachycardia with sinus rhythm.   Voiding spontaneously  Denies CP, SOB, F/C, N/V/D, or leg pain.   Objective  Objective:   Vitals:   07/25/19 1156 07/25/19 1224 07/25/19 1548 07/25/19 2003  BP: (!) 103/59 112/65 104/61 107/60  Pulse: (!) 111 (!) 110 (!) 106 (!) 110  Resp: 15 16 16 16   Temp: 98.6 F (37 C) 98.3 F (36.8 C) 98.7 F (37.1 C) 99.4 F (37.4 C)  TempSrc: Oral Oral Oral Oral  SpO2: 95% 94% 92% 94%  Weight:      Height:        Intake/Output Summary (Last 24 hours) at 07/25/2019 2342 Last data filed at 07/25/2019 2156 Gross per 24 hour  Intake 2312.7 ml  Output 1100 ml  Net 1212.7 ml   General: NAD Cardiovascular: RRR, no murmurs Pulmonary: CTAB, normal respiratory effort Abdomen: appropriately tender, moderately distended, +BS, no guarding.  Incision: c/d/i, covered by abdominal binder.  ABD pad has two spots of serosanguinous fluid   Extremities: No erythema or cords, no calf tenderness, with normal peripheral pulses.  Labs: Results for orders placed or performed during the hospital encounter of 07/22/19 (from the past 24 hour(s))  CBC     Status: Abnormal   Collection Time: 07/25/19  4:06 AM  Result Value Ref Range   WBC 17.7 (H) 4.0 - 10.5 K/uL   RBC 2.68 (L) 3.87 - 5.11 MIL/uL   Hemoglobin 7.9 (L) 12.0 - 15.0 g/dL   HCT 25.1 (L) 36.0 - 46.0 %   MCV 93.7 80.0 - 100.0 fL   MCH 29.5 26.0 - 34.0 pg   MCHC 31.5 30.0 - 36.0 g/dL   RDW 13.4 11.5 - 15.5 %   Platelets 293 150 - 400 K/uL   nRBC 0.0 0.0 - 0.2 %  Basic metabolic panel     Status: Abnormal   Collection Time: 07/25/19   4:06 AM  Result Value Ref Range   Sodium 137 135 - 145 mmol/L   Potassium 4.4 3.5 - 5.1 mmol/L   Chloride 104 98 - 111 mmol/L   CO2 26 22 - 32 mmol/L   Glucose, Bld 105 (H) 70 - 99 mg/dL   BUN 8 6 - 20 mg/dL   Creatinine, Ser 0.69 0.44 - 1.00 mg/dL   Calcium 7.8 (L) 8.9 - 10.3 mg/dL   GFR calc non Af Amer >60 >60 mL/min   GFR calc Af Amer >60 >60 mL/min   Anion gap 7 5 - 15  Prepare RBC     Status: None   Collection Time: 07/25/19  9:18 AM  Result Value Ref Range   Order Confirmation      ORDER PROCESSED BY BLOOD BANK Performed at [facility], [address], [place], [place] [address]   Sample to Blood Bank     Status: None   Collection Time: 07/25/19 10:42 AM  Result Value Ref Range   Blood Bank Specimen SAMPLE AVAILABLE FOR TESTING    Sample Expiration      07/28/2019,2359 Performed at [facility], [address], [place], [place] [address]     Cultures: Results for orders  placed or performed during the hospital encounter of 07/22/19  Urine Culture     Status: None   Collection Time: 07/23/19  4:00 PM   Specimen: Urine, Catheterized  Result Value Ref Range Status   Specimen Description   Final    URINE, CATHETERIZED Performed at [facility], [address], [place], [place] [address]    Special Requests   Final    NONE Performed at [facility], [address] [place], [place] [address]    Culture   Final    NO GROWTH Performed at [facility], [place] [address], [place], [place] [address]    Report Status 07/24/2019 FINAL  Final    Imaging: DG Abd 1 View  Result Date: 07/22/2019 CLINICAL DATA:  Evaluate for possible retained sponge EXAM: ABDOMEN - 1 VIEW COMPARISON:  None. FINDINGS: Scattered large and small bowel gas is noted. No abnormal mass or abnormal calcifications are seen. Postsurgical changes are noted. No radiopaque foreign body is identified to suggest retained sponge. IMPRESSION: No evidence of  retained foreign body. These results will be called to the ordering clinician or representative by the Radiologist Assistant, and communication documented in the PACS or zVision Dashboard. Electronically Signed   By: Inez Catalina M.D.   On: 07/22/2019 22:49     Assessment   44 y.o.. Hospital Day: 4 Postoperative day 3 from a laparoscopic converted to open total hysterectomy, left salpingectomy, cystoscopy, and small bowel resection  Plan   1. FEN: on IV fluids, s/p bolus and increase rate with improvement of both BPs and UOP.  Foley removed.    Advance diet upon flatus.  Small clears until then.  2. Pain control:  Not optimized, but getting there. On toradol + tylenol, oxycodone 10mg  with IV dilauded for breakthrough.  Changing to 2-4mg  dilauded PO q3hrs, with continue IV for breakthrough.  Continue abdominal binder use. Will also increase PM gabapentin to 900. Patient does not like PO meds.  2a.  Having tachycardia, likely secondary to pain as BP and other vitals indicate hemodynamically stability. 2b. Leukocytosis:  Increased from POD 1.  Will trend, no signs of infection.  Tmax 99.8 yesterday.    4. Postop: continue attempts to get OOB. Hopefully with altered pain med regimen and adequate fluid resuscitation she will have more strength and less lightheadedness upon standing.    5. Acute blood loss anemia:  Symptomatic.  Offered blood transfusion and patient agreed.  1u PRBC this AM.   6. Bowel function:  Awaiting return, will likely be slow progression.  Important not to push intake to avoid ileus.  She is at high risk for this due to bowel manipulation in surgery.  Simethicone available    7. Mental health:  Continue lexapro daily, she may benefit from visit with chaplain or other community support person   8. DVT prophylaxis:  SCDs and Lovenox BID, will be ambulating more often after foley removed today.   Continue inpatient admission   ----- Larey Days, MD, [place] Attending  Obstetrician and Gynecologist [facility], Department of [facility] Page:  [phone]    31 minutes total time spent on face to face, reviewing labs other notes, etc., and writing this note.

## 2019-07-26 ENCOUNTER — Encounter: Payer: Self-pay | Admitting: Obstetrics & Gynecology

## 2019-07-26 ENCOUNTER — Inpatient Hospital Stay: Payer: Managed Care, Other (non HMO)

## 2019-07-26 LAB — COMPREHENSIVE METABOLIC PANEL
ALT: 19 U/L (ref 0–44)
AST: 15 U/L (ref 15–41)
Albumin: 2.3 g/dL — ABNORMAL LOW (ref 3.5–5.0)
Alkaline Phosphatase: 59 U/L (ref 38–126)
Anion gap: 11 (ref 5–15)
BUN: 6 mg/dL (ref 6–20)
CO2: 21 mmol/L — ABNORMAL LOW (ref 22–32)
Calcium: 7.5 mg/dL — ABNORMAL LOW (ref 8.9–10.3)
Chloride: 105 mmol/L (ref 98–111)
Creatinine, Ser: 0.53 mg/dL (ref 0.44–1.00)
GFR calc Af Amer: >60 mL/min (ref >60)
GFR calc non Af Amer: >60 mL/min (ref >60)
Glucose, Bld: 83 mg/dL (ref 70–99)
Potassium: 3 mmol/L — ABNORMAL LOW (ref 3.5–5.1)
Sodium: 137 mmol/L (ref 135–145)
Total Bilirubin: 1.4 mg/dL — ABNORMAL HIGH (ref 0.3–1.2)
Total Protein: 5.6 g/dL — ABNORMAL LOW (ref 6.5–8.1)

## 2019-07-26 LAB — CBC
HCT: 27.4 % — ABNORMAL LOW (ref 36.0–46.0)
HCT: 29.3 % — ABNORMAL LOW (ref 36.0–46.0)
Hemoglobin: 9 g/dL — ABNORMAL LOW (ref 12.0–15.0)
Hemoglobin: 9.5 g/dL — ABNORMAL LOW (ref 12.0–15.0)
MCH: 30 pg (ref 26.0–34.0)
MCH: 29.4 pg (ref 26.0–34.0)
MCHC: 32.8 g/dL (ref 30.0–36.0)
MCHC: 32.4 g/dL (ref 30.0–36.0)
MCV: 91.3 fL (ref 80.0–100.0)
MCV: 90.7 fL (ref 80.0–100.0)
Platelets: 326 10*3/uL (ref 150–400)
Platelets: 368 10*3/uL (ref 150–400)
RBC: 3 MIL/uL — ABNORMAL LOW (ref 3.87–5.11)
RBC: 3.23 MIL/uL — ABNORMAL LOW (ref 3.87–5.11)
RDW: 14.1 % (ref 11.5–15.5)
RDW: 13.9 % (ref 11.5–15.5)
WBC: 19.4 10*3/uL — ABNORMAL HIGH (ref 4.0–10.5)
WBC: 9.5 10*3/uL (ref 4.0–10.5)
nRBC: 0.2 % (ref 0.0–0.2)
nRBC: 0.3 % — ABNORMAL HIGH (ref 0.0–0.2)

## 2019-07-26 LAB — LACTIC ACID, PLASMA
Lactic Acid, Venous: 0.9 mmol/L (ref 0.5–1.9)
Lactic Acid, Venous: 1.1 mmol/L (ref 0.5–1.9)

## 2019-07-26 LAB — BPAM RBC
Blood Product Expiration Date: 202103132359
ISSUE DATE / TIME: 202103111201
Unit Type and Rh: 9500

## 2019-07-26 LAB — TYPE AND SCREEN
ABO/RH(D): O POS
Antibody Screen: NEGATIVE
Unit division: 0

## 2019-07-26 MED ORDER — PIPERACILLIN-TAZOBACTAM 3.375 G IVPB 30 MIN
3.3750 g | Freq: Once | INTRAVENOUS | Status: AC
  Administered 2019-07-26: 3.375 g via INTRAVENOUS
  Filled 2019-07-26: qty 50

## 2019-07-26 MED ORDER — IOHEXOL 300 MG/ML  SOLN: 100.0000 mL | Freq: Once | INTRAMUSCULAR | Status: DC | PRN

## 2019-07-26 MED ORDER — IOHEXOL 300 MG/ML  SOLN
125.0000 mL | Freq: Once | INTRAMUSCULAR | Status: AC | PRN
  Administered 2019-07-26: 125 mL via INTRAVENOUS

## 2019-07-26 MED ORDER — IOHEXOL 9 MG/ML PO SOLN: 500.0000 mL | ORAL | Status: AC

## 2019-07-26 MED ORDER — PIPERACILLIN-TAZOBACTAM 3.375 G IVPB
3.3750 g | Freq: Three times a day (TID) | INTRAVENOUS | Status: DC
  Administered 2019-07-26: 3.375 g via INTRAVENOUS
  Filled 2019-07-26: qty 50

## 2019-07-26 MED ORDER — ONDANSETRON HCL 4 MG/2ML IJ SOLN
4.0000 mg | INTRAMUSCULAR | Status: DC | PRN
  Administered 2019-07-26 – 2019-08-12 (×26): 4 mg via INTRAVENOUS
  Filled 2019-07-26 (×26): qty 2

## 2019-07-26 MED ORDER — SODIUM CHLORIDE 0.9% FLUSH
5.0000 mL | Freq: Three times a day (TID) | INTRAVENOUS | Status: DC
  Administered 2019-07-26 – 2019-08-05 (×27): 5 mL

## 2019-07-26 MED ORDER — SODIUM CHLORIDE 0.9 % IV SOLN
INTRAVENOUS | Status: DC | PRN
  Administered 2019-07-26 – 2019-07-28 (×5): 250 mL via INTRAVENOUS
  Administered 2019-08-02: 1000 mL via INTRAVENOUS
  Administered 2019-08-07: 50 mL via INTRAVENOUS
  Administered 2019-08-07: 500 mL via INTRAVENOUS
  Administered 2019-08-07 – 2019-08-08 (×2): 50 mL via INTRAVENOUS
  Administered 2019-08-08: 500 mL via INTRAVENOUS
  Administered 2019-08-08: 50 mL via INTRAVENOUS

## 2019-07-26 MED ORDER — ONDANSETRON HCL 4 MG PO TABS: 4.0000 mg | ORAL_TABLET | Freq: Four times a day (QID) | ORAL | Status: DC | PRN

## 2019-07-26 MED ORDER — IOHEXOL 300 MG/ML  SOLN: 150.0000 mL | Freq: Once | INTRAMUSCULAR | Status: DC | PRN

## 2019-07-26 MED ORDER — PIPERACILLIN-TAZOBACTAM 3.375 G IVPB
3.3750 g | Freq: Three times a day (TID) | INTRAVENOUS | Status: DC
  Administered 2019-07-26 – 2019-08-07 (×33): 3.375 g via INTRAVENOUS
  Filled 2019-07-26 (×33): qty 50

## 2019-07-26 NOTE — Progress Notes (Signed)
Rapid Response follow-up note:  Spoke with SWOT RN who was present during IR procedure, and bedside RN this evening. The care team, including surgical team, is okay with patient staying on Med-Surg for now even with MEWS > 5. Patient has a plan of care in place, including: IVFs, IV antibiotics, and IR drain. Will report off to night-time rapid response RN to CTM the patient. Bedside staff encouraged to call RN if patient condition deteriorates.

## 2019-07-26 NOTE — Progress Notes (Signed)
PIV consult: Arrived to room, pt had been taken to radiology. Will request RN re-enter consult when she returns if PIV is still needed.

## 2019-07-26 NOTE — Progress Notes (Addendum)
[place] Hospital Day(s): 4.   Post op day(s): 4 Days Post-Op.   Interval History:  Patient seen and examined no acute events or new complaints overnight. Patient reports incisional soreness No emesis Increasing leukocytosis to 19K this morning; afebrile but remains tachycardic Hgb responded appropriately to 9.0 after 1 unit pRBCs yesterday No significant flatus yet  Vital signs in last 24 hours: [min-max] current  Temp:  [98.3 F (36.8 C)-99.4 F (37.4 C)] 98.9 F (37.2 C) (03/12 0448) Pulse Rate:  [106-111] 109 (03/12 0448) Resp:  [15-20] 20 (03/12 0448) BP: (103-129)/(59-67) 129/67 (03/12 0448) SpO2:  [92 %-96 %] 96 % (03/12 0448)     Height: 5\' 2"  (157.5 cm) Weight: 133.3 kg BMI (Calculated): 53.74   Intake/Output last 2 shifts:  03/11 0701 - 03/12 0700 In: 3000.2 [I.V.:2580.2; Blood:420] Out: 1250 [Urine:1250]   Physical Exam:  Constitutional: alert, cooperative and no distress  Respiratory: breathing non-labored at rest  Cardiovascular: tachycardic and sinus rhythm  Gastrointestinal: soft, incisional soreness, and non-distended, no rebound or guarding Genitourinary: no Foley Integumentary: Incision is CDI, sutured, no erythema, binder in place; small amount of serosanguinous drainage.  Labs:  CBC Latest Ref Rng & Units 07/26/2019 07/25/2019 07/23/2019  WBC 4.0 - 10.5 K/uL 19.4(H) 17.7(H) 11.6(H)  Hemoglobin 12.0 - 15.0 g/dL 9.0(L) 7.9(L) 10.2(L)  Hematocrit 36.0 - Karen.0 % 27.4(L) 25.1(L) 32.2(L)  Platelets 150 - 400 K/uL 326 293 312   CMP Latest Ref Rng & Units 07/25/2019 07/23/2019 07/18/2019  Glucose 70 - 99 mg/dL 105(H) 166(H) 123(H)  BUN 6 - 20 mg/dL 8 9 8   Creatinine 0.44 - 1.00 mg/dL 0.69 0.71 0.64  Sodium 135 - 145 mmol/L 137 134(L) 138  Potassium 3.5 - 5.1 mmol/L 4.4 4.1 4.0  Chloride 98 - 111 mmol/L 104 103 104  CO2 22 - 32 mmol/L 26 20(L) 27  Calcium 8.9 - 10.3 mg/dL 7.8(L) 7.7(L) 9.1     Imaging studies: No  new pertinent imaging studies   Assessment/Plan:  44 y.o. [place] increasing leukocytosis and persistent tachycardia without significant return of bowel function 4 Days Post-Op s/p exploratory laparotomy and repair of small bowel enterotomy as well as hysterectomy with OB/GYN   - Given increase in leukocytosis, persistent tachycardia, and lack of significant clinical improvement will obtain CT Abdomen/Pelvis to ensure no overlooked intra-abdominal process  - Continue CLD; would not rush to advance diet until having clear evidence of bowel function   - Trend leukocytosis   - Pain control prn; antiemetics prn             - Monitor abdominal examination; on-going bowel function             - Encouraged mobilization as tolerable             - May consider holding chemical DVT prophylaxis if concern for blood loss-related anemia             - Further management per OB/GYN; we will follow along    All of the above findings and recommendations were discussed with the patient, and the medical team, and all of patient's questions were answered to her expressed satisfaction.  -- Edison Simon, PA-C  [facility] 07/26/2019, 7:39 AM [phone] M-F: 7am - 4pm  I saw and evaluated the patient.  I agree with the above documentation, exam, and plan, which I have edited where appropriate. Fredirick Maudlin  9:Karen AM

## 2019-07-26 NOTE — Procedures (Signed)
Pre procedural Dx: Post op abdominal fluid collections Post procedural Dx: Same  Technically successful CT guided placed of a 10 Fr drainage catheter placement into the right lower abdomen yielding 150 cc of brown serous fluid.    A representative aspirated sample was capped and sent to the laboratory for analysis.    EBL: Trace Complications: None immediate  Ronny Bacon, MD Pager #: [phone]

## 2019-07-26 NOTE — Progress Notes (Signed)
Ch responded to RR. RN let Ch know that she will be taken downstairs for tests. Ch stayed until Pt was taken downstairs.

## 2019-07-26 NOTE — Significant Event (Addendum)
Rapid Response Event Note  Overview: Time Called: 1500 Arrival Time: 1505 Event Type: MEWS  Initial Focused Assessment:  Rapid Response RN was consulted at 1454 hrs to assess patient with a MEWS of 5. Upon arrival, patient is tachypneic in 30s, tachycardic in 130s, and febrile at 100.4, with SPO2 at 91% on RA. BP WNL. Patient is AA+Ox4. Patient endorses ABD pain 4/10 and nausea.  Interventions:  Bedside RN instructed to call rapid response. This RN then entered order for lactic acid level per Rapid Response order set. Patient's primary surgeon was unavailable, however, Dr. Celine Ahr, Dr. Hampton Abbot, and Shon Millet, PA, were notified and each came to assess the patient. A 1L NS bolus was given per Dr. Hampton Abbot. RNs X 3 were unable to obtain second PIV site so an IV team consult was placed. EKG was obtained. Patient was then transported to IR by SWOT RN, Apolinar Junes, for placement of an IR drain to drain free fluid from the patient's abdomen.  Plan of Care (if not transferred):  Plan: Rapid Response RN to reassess patient upon return from IR. Bedside RN, [place], in agreement with plan of care.  Event Summary: Name of Physician Notified: Shon Millet, [place]; Danae Orleans, MD; Fredirick Maudlin, MD; Olean Ree, MD) at 1500    at    Outcome: Stayed in room and stabalized  Event End Time: [place]

## 2019-07-26 NOTE — Progress Notes (Signed)
Returned to room from ultrasound.  I remained with pt during procedure.  Dr Hampton Abbot and Thedore Mins PA were also present.  Pt had ongoing c/o nausea during procedure.  Pt remained tachycardic around 130 and tachypneic in the 30's throughout procedure.  BP was WNL.    Spoke with [place], [place].  He did not feel that patient needed to move to stepdown unit at this time.  We will continue to monitor VSS closely and if she deteriorates, would require move to stepdown unit.  Report to [facility] ICU RN and Minette Brine pt's bedside nurse given.   Pt placed back on off unit tele.  Husband at bedside.

## 2019-07-27 ENCOUNTER — Inpatient Hospital Stay: Payer: Managed Care, Other (non HMO)

## 2019-07-27 ENCOUNTER — Inpatient Hospital Stay: Payer: Managed Care, Other (non HMO) | Admitting: Anesthesiology

## 2019-07-27 ENCOUNTER — Encounter
Admission: RE | Disposition: A | Discharge status: Home Health | Payer: Self-pay | Source: Home / Self Care | Attending: Surgery

## 2019-07-27 HISTORY — PX: BOWEL RESECTION: SHX1257

## 2019-07-27 HISTORY — PX: LAPAROTOMY: SHX154

## 2019-07-27 LAB — BASIC METABOLIC PANEL
Anion gap: 9 (ref 5–15)
BUN: 8 mg/dL (ref 6–20)
CO2: 25 mmol/L (ref 22–32)
Calcium: 7.7 mg/dL — ABNORMAL LOW (ref 8.9–10.3)
Chloride: 101 mmol/L (ref 98–111)
Creatinine, Ser: 0.53 mg/dL (ref 0.44–1.00)
GFR calc Af Amer: >60 mL/min (ref >60)
GFR calc non Af Amer: >60 mL/min (ref >60)
Glucose, Bld: 124 mg/dL — ABNORMAL HIGH (ref 70–99)
Potassium: 2.7 mmol/L — CL (ref 3.5–5.1)
Sodium: 135 mmol/L (ref 135–145)

## 2019-07-27 LAB — CBC WITH DIFFERENTIAL/PLATELET
Abs Immature Granulocytes: 0.42 10*3/uL — ABNORMAL HIGH (ref 0.00–0.07)
Basophils Absolute: 0.1 10*3/uL (ref 0.0–0.1)
Basophils Relative: 1 %
Eosinophils Absolute: 0.1 10*3/uL (ref 0.0–0.5)
Eosinophils Relative: 0 %
HCT: 28 % — ABNORMAL LOW (ref 36.0–46.0)
Hemoglobin: 9.1 g/dL — ABNORMAL LOW (ref 12.0–15.0)
Immature Granulocytes: 3 %
Lymphocytes Relative: 7 %
Lymphs Abs: 1 10*3/uL (ref 0.7–4.0)
MCH: 29.4 pg (ref 26.0–34.0)
MCHC: 32.5 g/dL (ref 30.0–36.0)
MCV: 90.3 fL (ref 80.0–100.0)
Monocytes Absolute: 0.9 10*3/uL (ref 0.1–1.0)
Monocytes Relative: 6 %
Neutro Abs: 11.7 10*3/uL — ABNORMAL HIGH (ref 1.7–7.7)
Neutrophils Relative %: 83 %
Platelets: 366 10*3/uL (ref 150–400)
RBC: 3.1 MIL/uL — ABNORMAL LOW (ref 3.87–5.11)
RDW: 14.3 % (ref 11.5–15.5)
WBC: 14.1 10*3/uL — ABNORMAL HIGH (ref 4.0–10.5)
nRBC: 0.1 % (ref 0.0–0.2)

## 2019-07-27 LAB — MAGNESIUM: Magnesium: 1.5 mg/dL — ABNORMAL LOW (ref 1.7–2.4)

## 2019-07-27 LAB — POTASSIUM: Potassium: 3.7 mmol/L (ref 3.5–5.1)

## 2019-07-27 LAB — PHOSPHORUS: Phosphorus: 1.5 mg/dL — ABNORMAL LOW (ref 2.5–4.6)

## 2019-07-27 SURGERY — EXAM UNDER ANESTHESIA
Anesthesia: General

## 2019-07-27 MED ORDER — PHENYLEPHRINE HCL (PRESSORS) 10 MG/ML IV SOLN
INTRAVENOUS | Status: DC | PRN
  Administered 2019-07-27: 100 ug via INTRAVENOUS

## 2019-07-27 MED ORDER — FENTANYL CITRATE (PF) 100 MCG/2ML IJ SOLN
INTRAMUSCULAR | Status: AC
  Filled 2019-07-27: qty 2

## 2019-07-27 MED ORDER — ALBUMIN HUMAN 5 % IV SOLN
INTRAVENOUS | Status: AC
  Filled 2019-07-27: qty 250

## 2019-07-27 MED ORDER — MIDAZOLAM HCL 2 MG/2ML IJ SOLN
INTRAMUSCULAR | Status: DC | PRN
  Administered 2019-07-27: 2 mg via INTRAVENOUS

## 2019-07-27 MED ORDER — ONDANSETRON HCL 4 MG/2ML IJ SOLN
INTRAMUSCULAR | Status: DC | PRN
  Administered 2019-07-27: 4 mg via INTRAVENOUS

## 2019-07-27 MED ORDER — PROMETHAZINE HCL 25 MG/ML IJ SOLN
12.5000 mg | Freq: Four times a day (QID) | INTRAMUSCULAR | Status: DC | PRN
  Administered 2019-07-27 – 2019-07-28 (×3): 12.5 mg via INTRAVENOUS
  Filled 2019-07-27 (×3): qty 1

## 2019-07-27 MED ORDER — PROPOFOL 500 MG/50ML IV EMUL
INTRAVENOUS | Status: AC
  Filled 2019-07-27: qty 50

## 2019-07-27 MED ORDER — BUPIVACAINE HCL (PF) 0.5 % IJ SOLN
INTRAMUSCULAR | Status: AC
  Filled 2019-07-27: qty 30

## 2019-07-27 MED ORDER — HYDROMORPHONE HCL 1 MG/ML IJ SOLN
INTRAMUSCULAR | Status: AC
  Filled 2019-07-27: qty 1

## 2019-07-27 MED ORDER — ACETAMINOPHEN 10 MG/ML IV SOLN
INTRAVENOUS | Status: AC
  Filled 2019-07-27: qty 100

## 2019-07-27 MED ORDER — SODIUM CHLORIDE FLUSH 0.9 % IV SOLN
INTRAVENOUS | Status: AC
  Filled 2019-07-27: qty 50

## 2019-07-27 MED ORDER — ALBUMIN HUMAN 5 % IV SOLN: INTRAVENOUS | Status: DC | PRN

## 2019-07-27 MED ORDER — POTASSIUM PHOSPHATES 15 MMOLE/5ML IV SOLN
30.0000 mmol | Freq: Once | INTRAVENOUS | Status: AC
  Administered 2019-07-27: 30 mmol via INTRAVENOUS
  Filled 2019-07-27: qty 10

## 2019-07-27 MED ORDER — SEVOFLURANE IN SOLN
RESPIRATORY_TRACT | Status: AC
  Filled 2019-07-27: qty 250

## 2019-07-27 MED ORDER — ROCURONIUM BROMIDE 100 MG/10ML IV SOLN
INTRAVENOUS | Status: DC | PRN
  Administered 2019-07-27: 10 mg via INTRAVENOUS
  Administered 2019-07-27: 30 mg via INTRAVENOUS
  Administered 2019-07-28: 10 mg via INTRAVENOUS
  Administered 2019-07-28: 5 mg via INTRAVENOUS

## 2019-07-27 MED ORDER — SODIUM CHLORIDE 0.45 % IV SOLN: INTRAVENOUS | Status: DC

## 2019-07-27 MED ORDER — MIDAZOLAM HCL 2 MG/2ML IJ SOLN
INTRAMUSCULAR | Status: AC
  Filled 2019-07-27: qty 2

## 2019-07-27 MED ORDER — POTASSIUM CHLORIDE 10 MEQ/100ML IV SOLN
10.0000 meq | INTRAVENOUS | Status: DC
  Administered 2019-07-27: 20:00:00 10 meq via INTRAVENOUS
  Filled 2019-07-27: qty 100

## 2019-07-27 MED ORDER — PROPOFOL 10 MG/ML IV BOLUS
INTRAVENOUS | Status: DC | PRN
  Administered 2019-07-27: 100 mg via INTRAVENOUS
  Administered 2019-07-28: 30 mg via INTRAVENOUS

## 2019-07-27 MED ORDER — TRANEXAMIC ACID 1000 MG/10ML IV SOLN
INTRAVENOUS | Status: DC | PRN
  Administered 2019-07-27: 1000 mg via INTRAVENOUS

## 2019-07-27 MED ORDER — HYDROMORPHONE HCL 1 MG/ML IJ SOLN
0.2000 mg | INTRAMUSCULAR | Status: DC | PRN
  Administered 2019-07-27 – 2019-07-28 (×2): .5 mg via INTRAVENOUS
  Administered 2019-07-28: 0.25 mg via INTRAVENOUS

## 2019-07-27 MED ORDER — MAGNESIUM SULFATE 2 GM/50ML IV SOLN
2.0000 g | Freq: Once | INTRAVENOUS | Status: AC
  Administered 2019-07-27: 2 g via INTRAVENOUS
  Filled 2019-07-27: qty 50

## 2019-07-27 MED ORDER — LACTATED RINGERS IV SOLN: INTRAVENOUS | Status: DC | PRN

## 2019-07-27 MED ORDER — POTASSIUM CHLORIDE CRYS ER 20 MEQ PO TBCR
40.0000 meq | EXTENDED_RELEASE_TABLET | ORAL | Status: AC
  Filled 2019-07-27: qty 2

## 2019-07-27 MED ORDER — TRANEXAMIC ACID 1000 MG/10ML IV SOLN
INTRAVENOUS | Status: AC
  Filled 2019-07-27: qty 10

## 2019-07-27 MED ORDER — BUPIVACAINE LIPOSOME 1.3 % IJ SUSP
INTRAMUSCULAR | Status: AC
  Filled 2019-07-27: qty 20

## 2019-07-27 MED ORDER — HYDROMORPHONE HCL 2 MG PO TABS: 2.0000 mg | ORAL_TABLET | ORAL | Status: DC | PRN

## 2019-07-27 MED ORDER — SODIUM CHLORIDE 0.9% FLUSH
10.0000 mL | Freq: Two times a day (BID) | INTRAVENOUS | Status: DC
  Administered 2019-07-28 – 2019-08-07 (×19): 10 mL
  Administered 2019-08-07: 30 mL
  Administered 2019-08-08 – 2019-08-11 (×8): 10 mL
  Administered 2019-08-12: 14:00:00 20 mL
  Administered 2019-08-12 – 2019-08-18 (×12): 10 mL

## 2019-07-27 MED ORDER — FENTANYL CITRATE (PF) 100 MCG/2ML IJ SOLN
INTRAMUSCULAR | Status: DC | PRN
  Administered 2019-07-27 (×2): 50 ug via INTRAVENOUS

## 2019-07-27 MED ORDER — SUCCINYLCHOLINE CHLORIDE 20 MG/ML IJ SOLN
INTRAMUSCULAR | Status: DC | PRN
  Administered 2019-07-27: 140 mg via INTRAVENOUS

## 2019-07-27 MED ORDER — LIDOCAINE HCL (CARDIAC) PF 100 MG/5ML IV SOSY
PREFILLED_SYRINGE | INTRAVENOUS | Status: DC | PRN
  Administered 2019-07-27: 100 mg via INTRAVENOUS

## 2019-07-27 SURGICAL SUPPLY — 66 items
BACTOSHIELD CHG 4% 4OZ (MISCELLANEOUS) ×1
BAG URINE DRAIN 2000ML AR STRL (UROLOGICAL SUPPLIES) ×4 IMPLANT
BLADE SURG SZ11 CARB STEEL (BLADE) ×4 IMPLANT
CANISTER SUCT 1200ML W/VALVE (MISCELLANEOUS) ×4 IMPLANT
CATH FOLEY 2WAY  5CC 16FR (CATHETERS) ×2
CATH URTH 16FR FL 2W BLN LF (CATHETERS) ×2 IMPLANT
CHLORAPREP W/TINT 26 (MISCELLANEOUS) ×4 IMPLANT
COVER WAND RF STERILE (DRAPES) ×4 IMPLANT
CUP MEDICINE 2OZ PLAST GRAD ST (MISCELLANEOUS) ×4 IMPLANT
DERMABOND ADVANCED (GAUZE/BANDAGES/DRESSINGS)
DERMABOND ADVANCED .7 DNX12 (GAUZE/BANDAGES/DRESSINGS) IMPLANT
DRAPE LEGGINS SURG 28X43 STRL (DRAPES) ×4 IMPLANT
DRAPE UNDER BUTTOCK W/FLU (DRAPES) IMPLANT
ELECT REM PT RETURN 9FT ADLT (ELECTROSURGICAL)
ELECTRODE REM PT RTRN 9FT ADLT (ELECTROSURGICAL) IMPLANT
GAUZE 4X4 16PLY RFD (DISPOSABLE) ×4 IMPLANT
GLOVE BIO SURGEON STRL SZ 6.5 (GLOVE) ×3 IMPLANT
GLOVE BIO SURGEONS STRL SZ 6.5 (GLOVE) ×1
GLOVE PI ORTHOPRO 6.5 (GLOVE) ×2
GLOVE PI ORTHOPRO STRL 6.5 (GLOVE) ×2 IMPLANT
GLOVE SURG SYN 6.5 ES PF (GLOVE) ×4 IMPLANT
GOWN STRL REUS W/ TWL LRG LVL3 (GOWN DISPOSABLE) ×4 IMPLANT
GOWN STRL REUS W/ TWL XL LVL3 (GOWN DISPOSABLE) ×2 IMPLANT
GOWN STRL REUS W/TWL LRG LVL3 (GOWN DISPOSABLE) ×4
GOWN STRL REUS W/TWL XL LVL3 (GOWN DISPOSABLE) ×2
GRASPER SUT TROCAR 14GX15 (MISCELLANEOUS) IMPLANT
IRRIGATION STRYKERFLOW (MISCELLANEOUS) IMPLANT
IRRIGATOR STRYKERFLOW (MISCELLANEOUS)
IV LACTATED RINGERS 1000ML (IV SOLUTION) ×4 IMPLANT
KIT PINK PAD W/HEAD ARE REST (MISCELLANEOUS) ×4
KIT PINK PAD W/HEAD ARM REST (MISCELLANEOUS) ×2 IMPLANT
KIT TURNOVER CYSTO (KITS) ×4 IMPLANT
L-HOOK LAP DISP 36CM (ELECTROSURGICAL)
LABEL OR SOLS (LABEL) ×4 IMPLANT
LHOOK LAP DISP 36CM (ELECTROSURGICAL) IMPLANT
LIGASURE IMPACT 36 18CM CVD LR (INSTRUMENTS) ×4 IMPLANT
LIGASURE VESSEL 5MM BLUNT TIP (ELECTROSURGICAL) IMPLANT
MANIPULATOR UTERINE 4.5 ZUMI (MISCELLANEOUS) IMPLANT
NS IRRIG 500ML POUR BTL (IV SOLUTION) ×4 IMPLANT
PACK DNC HYST (MISCELLANEOUS) ×4 IMPLANT
PACK LAP CHOLECYSTECTOMY (MISCELLANEOUS) ×4 IMPLANT
PAD OB MATERNITY 4.3X12.25 (PERSONAL CARE ITEMS) ×4 IMPLANT
PAD PREP 24X41 OB/GYN DISP (PERSONAL CARE ITEMS) ×4 IMPLANT
PENCIL ELECTRO HAND CTR (MISCELLANEOUS) IMPLANT
POUCH SPECIMEN RETRIEVAL 10MM (ENDOMECHANICALS) IMPLANT
RELOAD PROXIMATE 75MM BLUE (ENDOMECHANICALS) ×12 IMPLANT
SCRUB CHG 4% DYNA-HEX 4OZ (MISCELLANEOUS) ×3 IMPLANT
SET TUBE SMOKE EVAC HIGH FLOW (TUBING) IMPLANT
SLEEVE ENDOPATH XCEL 5M (ENDOMECHANICALS) IMPLANT
SOL PREP PVP 2OZ (MISCELLANEOUS) ×4
SOLUTION PREP PVP 2OZ (MISCELLANEOUS) ×2 IMPLANT
STAPLER PROXIMATE 75MM BLUE (STAPLE) ×4 IMPLANT
SURGILUBE 2OZ TUBE FLIPTOP (MISCELLANEOUS) ×4 IMPLANT
SUT MNCRL 4-0 (SUTURE) ×2
SUT MNCRL 4-0 27XMFL (SUTURE) ×2
SUT MNCRL AB 4-0 PS2 18 (SUTURE) ×4 IMPLANT
SUT PDS AB 1 TP1 96 (SUTURE) ×8 IMPLANT
SUT VIC AB 3-0 SH 27 (SUTURE) ×4
SUT VIC AB 3-0 SH 27X BRD (SUTURE) ×4 IMPLANT
SUT VICRYL 0 AB UR-6 (SUTURE) IMPLANT
SUTURE MNCRL 4-0 27XMF (SUTURE) ×2 IMPLANT
SYR 50ML LL SCALE MARK (SYRINGE) IMPLANT
TOWEL OR 17X26 4PK STRL BLUE (TOWEL DISPOSABLE) ×4 IMPLANT
TROCAR ENDO BLADELESS 11MM (ENDOMECHANICALS) IMPLANT
TROCAR XCEL NON-BLD 5MMX100MML (ENDOMECHANICALS) IMPLANT
TUBING ART PRESS 48 MALE/FEM (TUBING) IMPLANT

## 2019-07-27 NOTE — Progress Notes (Signed)
Obstetric and Gynecology  POD 3  Subjective   Patient reports pain improved from yesterday.  Ambulation yesterday with nursing, walker.  Still somewhat lightheaded with ambulation.    Abdominal distention has improved with time, simethicone, and ambulation.  No flatus.  No increase in belching.   Tachycardic throughout the day, increases with pain.  EKG yesterday showed tachycardia with sinus rhythm.   Voiding spontaneously  Denies CP, SOB, F/C, N/V/D, or leg pain.   Objective   Objective:   Vitals:   07/27/19 0937 07/27/19 0942 07/27/19 1150 07/27/19 1314  BP:  (!) 132/57  (!) 130/52  Pulse: (!) 125 (!) 123  (!) 115  Resp:  16  16  Temp: 98.7 F (37.1 C)  (!) 100.7 F (38.2 C) 98.9 F (37.2 C)  TempSrc: Oral  Axillary Oral  SpO2: 91% 96%  100%  Weight:      Height:        Intake/Output Summary (Last 24 hours) at 07/27/2019 1701 Last data filed at 07/27/2019 1351 Gross per 24 hour  Intake 4792.05 ml  Output 250 ml  Net 4542.05 ml   General: NAD Cardiovascular: RRR, no murmurs Pulmonary: CTAB, normal respiratory effort Abdomen: appropriately tender, moderately distended, +BS, no guarding.  Incision: c/d/i, covered by abdominal binder.  ABD pad has two spots of serosanguinous fluid   Extremities: No erythema or cords, no calf tenderness, with normal peripheral pulses.  Labs: Results for orders placed or performed during the hospital encounter of 07/22/19 (from the past 24 hour(s))  Lactic acid, plasma     Status: None   Collection Time: 07/26/19  5:07 PM  Result Value Ref Range   Lactic Acid, Venous 1.1 0.5 - 1.9 mmol/L  CBC     Status: Abnormal   Collection Time: 07/26/19  5:07 PM  Result Value Ref Range   WBC 9.5 4.0 - 10.5 K/uL   RBC 3.23 (L) 3.87 - 5.11 MIL/uL   Hemoglobin 9.5 (L) 12.0 - 15.0 g/dL   HCT 29.3 (L) 36.0 - 46.0 %   MCV 90.7 80.0 - 100.0 fL   MCH 29.4 26.0 - 34.0 pg   MCHC 32.4 30.0 - 36.0 g/dL   RDW 13.9 11.5 - 15.5 %   Platelets 368  150 - 400 K/uL   nRBC 0.3 (H) 0.0 - 0.2 %  Comprehensive metabolic panel     Status: Abnormal   Collection Time: 07/26/19  5:07 PM  Result Value Ref Range   Sodium 137 135 - 145 mmol/L   Potassium 3.0 (L) 3.5 - 5.1 mmol/L   Chloride 105 98 - 111 mmol/L   CO2 21 (L) 22 - 32 mmol/L   Glucose, Bld 83 70 - 99 mg/dL   BUN 6 6 - 20 mg/dL   Creatinine, Ser 0.53 0.44 - 1.00 mg/dL   Calcium 7.5 (L) 8.9 - 10.3 mg/dL   Total Protein 5.6 (L) 6.5 - 8.1 g/dL   Albumin 2.3 (L) 3.5 - 5.0 g/dL   AST 15 15 - 41 U/L   ALT 19 0 - 44 U/L   Alkaline Phosphatase 59 38 - 126 U/L   Total Bilirubin 1.4 (H) 0.3 - 1.2 mg/dL   GFR calc non Af Amer >60 >60 mL/min   GFR calc Af Amer >60 >60 mL/min   Anion gap 11 5 - 15  CBC with Differential/Platelet     Status: Abnormal   Collection Time: 07/27/19  1:47 PM  Result Value Ref Range  WBC 14.1 (H) 4.0 - 10.5 K/uL   RBC 3.10 (L) 3.87 - 5.11 MIL/uL   Hemoglobin 9.1 (L) 12.0 - 15.0 g/dL   HCT 28.0 (L) 36.0 - 46.0 %   MCV 90.3 80.0 - 100.0 fL   MCH 29.4 26.0 - 34.0 pg   MCHC 32.5 30.0 - 36.0 g/dL   RDW 14.3 11.5 - 15.5 %   Platelets 366 150 - 400 K/uL   nRBC 0.1 0.0 - 0.2 %   Neutrophils Relative % 83 %   Neutro Abs 11.7 (H) 1.7 - 7.7 K/uL   Lymphocytes Relative 7 %   Lymphs Abs 1.0 0.7 - 4.0 K/uL   Monocytes Relative 6 %   Monocytes Absolute 0.9 0.1 - 1.0 K/uL   Eosinophils Relative 0 %   Eosinophils Absolute 0.1 0.0 - 0.5 K/uL   Basophils Relative 1 %   Basophils Absolute 0.1 0.0 - 0.1 K/uL   WBC Morphology TOXIC GRANULATION    Immature Granulocytes 3 %   Abs Immature Granulocytes 0.42 (H) 0.00 - 0.07 K/uL  Basic metabolic panel     Status: Abnormal   Collection Time: 07/27/19  1:47 PM  Result Value Ref Range   Sodium 135 135 - 145 mmol/L   Potassium 2.7 (LL) 3.5 - 5.1 mmol/L   Chloride 101 98 - 111 mmol/L   CO2 25 22 - 32 mmol/L   Glucose, Bld 124 (H) 70 - 99 mg/dL   BUN 8 6 - 20 mg/dL   Creatinine, Ser 0.53 0.44 - 1.00 mg/dL   Calcium 7.7  (L) 8.9 - 10.3 mg/dL   GFR calc non Af Amer >60 >60 mL/min   GFR calc Af Amer >60 >60 mL/min   Anion gap 9 5 - 15  Magnesium     Status: Abnormal   Collection Time: 07/27/19  1:47 PM  Result Value Ref Range   Magnesium 1.5 (L) 1.7 - 2.4 mg/dL  Phosphorus     Status: Abnormal   Collection Time: 07/27/19  1:47 PM  Result Value Ref Range   Phosphorus 1.5 (L) 2.5 - 4.6 mg/dL    Cultures: Results for orders placed or performed during the hospital encounter of 07/22/19  Urine Culture     Status: None   Collection Time: 07/23/19  4:00 PM   Specimen: Urine, Catheterized  Result Value Ref Range Status   Specimen Description   Final    URINE, CATHETERIZED Performed at [facility], [address], [place], [place] [address]    Special Requests   Final    NONE Performed at [facility], [address], [place], [place] [address]    Culture   Final    NO GROWTH Performed at [facility], [address] [place], [place] [address]    Report Status 07/24/2019 FINAL  Final  Aerobic/Anaerobic Culture (surgical/deep wound)     Status: None (Preliminary result)   Collection Time: 07/26/19  4:42 PM   Specimen: PATH Other; Tissue  Result Value Ref Range Status   Specimen Description   Final    FLUID ABDOMEN RLQ Performed at [facility], [address], [place], [place] [address]    Special Requests   Final    NONE Performed at [facility], [address] [place],  [address]    Gram Stain   Final    FEW WBC PRESENT, PREDOMINANTLY PMN NO ORGANISMS SEEN Performed at [facility] [address],  [place], [place] [address]    Culture PENDING  Incomplete   Report Status PENDING  Incomplete    Imaging: DG Chest 1 View  Result Date: 07/27/2019 CLINICAL DATA:  Tachypnea.  Recent abdominal surgery. EXAM: CHEST  1 VIEW COMPARISON:  11/03/2010 FINDINGS: Lung volumes are low. There is opacity at the right lung base consistent with  atelectasis. Remainder of the lungs is clear. No convincing pleural effusion and no pneumothorax. Cardiac silhouette is normal in size. No mediastinal or hilar masses. Skeletal structures are grossly intact. IMPRESSION: 1. No acute cardiopulmonary disease. 2. Mild right lung base atelectasis. Electronically Signed   By: Lajean Manes M.D.   On: 07/27/2019 16:15   DG Abd 1 View  Result Date: 07/22/2019 CLINICAL DATA:  Evaluate for possible retained sponge EXAM: ABDOMEN - 1 VIEW COMPARISON:  None. FINDINGS: Scattered large and small bowel gas is noted. No abnormal mass or abnormal calcifications are seen. Postsurgical changes are noted. No radiopaque foreign body is identified to suggest retained sponge. IMPRESSION: No evidence of retained foreign body. These results will be called to the ordering clinician or representative by the Radiologist Assistant, and communication documented in the PACS or zVision Dashboard. Electronically Signed   By: Inez Catalina M.D.   On: 07/22/2019 22:49   [place] Abscess Drain  Result Date: 07/26/2019 INDICATION: History of hysterectomy complicated by small bowel injury requiring resection and primary anastomosis. CT scan of the abdomen and pelvis performed earlier today secondary to elevated white blood cell count and fever demonstrated indeterminate fluid collections within the abdomen with dominant collection within the right lower abdominal quadrant As such, request made for ultrasound-guided paracentesis versus drainage catheter placement for and diagnostic and potentially therapeutic purposes. EXAM: ULTRASOUND GUIDED ABSCESS DRAINAGE COMPARISON:  CT abdomen and pelvis - earlier same day MEDICATIONS: The patient is currently admitted to the hospital and receiving intravenous antibiotics. The antibiotics were administered within an appropriate time frame prior to the initiation of the procedure. ANESTHESIA/SEDATION: None CONTRAST:  None COMPLICATIONS: None immediate. PROCEDURE:  Informed written consent was obtained from the patient after a discussion of the risks, benefits and alternatives to treatment. The patient was placed supine on her hospital bed and sonographic evaluation was performed of the abdomen demonstrating ill-defined fluid scattered throughout the abdomen with dominant collection within right lower abdominal quadrant measuring approximately 9.6 x 8.2 x 3.9 cm, correlating with the dominant collection seen on preceding abdominal CT image 66, series 2. The procedure was planned. A timeout was performed prior to the initiation of the procedure. The skin overlying the right lower abdomen was prepped and draped in the usual sterile fashion. The overlying soft tissues were anesthetized with 1% lidocaine with epinephrine. Appropriate trajectory was planned with the use of a 22 gauge spinal needle. An 18 gauge trocar needle was advanced into the abscess/fluid collection and a short Amplatz super stiff wire was coiled within the collection. Multiple ultrasound images were saved for procedural documentation purposes Next, the track was dilated ultimately allowing placement of a 10 [place] all-purpose drainage catheter. Next, approximately 150 cc of brown serous ascitic fluid was aspirated. A representative sample was capped and sent to the laboratory for analysis. The tube was connected to a drainage bag and sutured in place. A dressing was placed. The patient tolerated the procedure well without immediate post procedural complication. IMPRESSION: Successful ultrasound guided placement of a 10 French all purpose drain catheter into the dominant collection within the right lower abdomen with aspiration of 150  cc of brown, serous ascitic fluid. Samples were sent to the laboratory as requested by the ordering clinical team. Electronically Signed   By: Sandi Mariscal M.D.   On: 07/26/2019 17:30   CT ABDOMEN PELVIS W CONTRAST  Result Date: 07/26/2019 CLINICAL DATA:  Abdominal pain and  tenderness. Leukocytosis. Recent small bowel resection and hysterectomy. EXAM: CT ABDOMEN AND PELVIS WITH CONTRAST TECHNIQUE: Multidetector CT imaging of the abdomen and pelvis was performed using the standard protocol following bolus administration of intravenous contrast. CONTRAST:  149mL OMNIPAQUE IOHEXOL 300 MG/ML  SOLN COMPARISON:  06/21/2019 FINDINGS: Lower Chest: New bibasilar atelectasis. Hepatobiliary: No hepatic masses identified. Prior cholecystectomy. No evidence of biliary obstruction. Pancreas:  No mass or inflammatory changes. Spleen: Within normal limits in size and appearance. Adrenals/Urinary Tract: Stable tiny bilateral renal cysts. No masses identified. No evidence of ureteral calculi or hydronephrosis. Stomach/Bowel: A small amount of extraperitoneal air in the lower pelvis, and intraperitoneal air, are consistent with recent surgery. No evidence of bowel obstruction. Mildly dilated small bowel loops are seen containing air-fluid levels and increased gaseous distention of colon is also seen, suspicious for adynamic ileus. Diffuse mesenteric inflammatory changes or edema are new since previous study. Multiple small loculated fluid intraperitoneal fluid collections are seen in the abdomen and pelvis, 1 in the right lower quadrant measuring 9.8 x 5.1 cm on image 66/2. Vascular/Lymphatic: No pathologically enlarged lymph nodes. No abdominal aortic aneurysm. Reproductive: Prior hysterectomy noted. Rim enhancing fluid collection seen in the hysterectomy bed which measures 8.0 x 7.4 cm on image 67/7. Other:  None. Musculoskeletal:  No suspicious bone lesions identified. IMPRESSION: 1. Small amount of intraperitoneal and extraperitoneal air, consistent with recent surgery. 2. Multiple loculated intraperitoneal fluid collections in the abdomen and pelvis, largest in the right lower quadrant measuring 9.8 cm and pelvic cul-de-sac measuring 8.0 cm, suspicious for abscesses. 3. Mild diffuse dilatation of  small bowel and colon, suspicious for adynamic ileus. 4. New bibasilar atelectasis. Electronically Signed   By: Marlaine Hind M.D.   On: 07/26/2019 13:38   US Venous Img Lower Bilateral (DVT)  Result Date: 07/27/2019 CLINICAL DATA:  Tachycardia.  History of recent surgery. EXAM: BILATERAL LOWER EXTREMITY VENOUS DOPPLER ULTRASOUND TECHNIQUE: Gray-scale sonography with compression, as well as color and duplex ultrasound, were performed to evaluate the deep venous system(s) from the level of the common femoral vein through the popliteal and proximal calf veins. COMPARISON:  None. FINDINGS: VENOUS Normal compressibility of the common femoral, superficial femoral, and popliteal veins, as well as the visualized calf veins. Visualized portions of profunda femoral vein and great saphenous vein unremarkable. No filling defects to suggest DVT on grayscale or color Doppler imaging. Doppler waveforms show normal direction of venous flow, normal respiratory phasicity and response to augmentation. Limited views of the contralateral common femoral vein are unremarkable. OTHER Edema noted in the legs bilaterally. Limitations: none IMPRESSION: No femoropopliteal DVT nor evidence of DVT within the visualized calf veins. If clinical symptoms are inconsistent or if there are persistent or worsening symptoms, further imaging (possibly involving the iliac veins) may be warranted. Electronically Signed   By: Lajean Manes M.D.   On: 07/27/2019 16:16      Assessment   44 y.o.. Hospital Day: 6 Postoperative day 3 from a laparoscopic converted to open total hysterectomy, left salpingectomy, cystoscopy, and small bowel resection  Plan   1. FEN: on IV fluids, s/p bolus and increase rate with improvement of both BPs and UOP.  Foley removed.  Advance diet upon flatus.  Small clears until then.  2. Pain control:  Not optimized, but getting there. On toradol + tylenol, oxycodone 10mg  with IV dilauded for breakthrough.  Changing  to 2-4mg  dilauded PO q3hrs, with continue IV for breakthrough.  Continue abdominal binder use. Will also increase PM gabapentin to 900. Patient does not like PO meds.  2a.  Having tachycardia, likely secondary to pain as BP and other vitals indicate hemodynamically stability. 2b. Leukocytosis:  Increased from POD 1.  Will trend, no signs of infection.  Tmax 99.8 yesterday.    4. Postop: continue attempts to get OOB. Hopefully with altered pain med regimen and adequate fluid resuscitation she will have more strength and less lightheadedness upon standing.    5. Acute blood loss anemia:  Symptomatic.  Offered blood transfusion and patient agreed.  1u PRBC this AM.   6. Bowel function:  Awaiting return, will likely be slow progression.  Important not to push intake to avoid ileus.  She is at high risk for this due to bowel manipulation in surgery.  Simethicone available    7. Mental health:  Continue lexapro daily, she may benefit from visit with chaplain or other community support person   8. DVT prophylaxis:  SCDs and Lovenox BID, will be ambulating more often after foley removed today.   Continue inpatient admission   ----- Larey Days, MD, [place] Attending Obstetrician and Gynecologist [facility], Department of [facility] Page:  [phone]    31 minutes total time spent on face to face, reviewing labs other notes, etc., and writing this note.

## 2019-07-27 NOTE — Progress Notes (Signed)
Per MD Ward, maintenance fluids rate changed to 2ml/hr.

## 2019-07-27 NOTE — Anesthesia Preprocedure Evaluation (Addendum)
Anesthesia Evaluation  Patient identified by MRN, date of birth, ID band Patient awake    Reviewed: Allergy & Precautions, H&P , NPO status , Patient's Chart, lab work & pertinent test results  Airway Mallampati: III  TM Distance: >3 FB Neck ROM: full    Dental  (+) Teeth Intact   Pulmonary former smoker,     + decreased breath sounds      Cardiovascular negative cardio ROS   Rhythm:regular Rate:Tachycardia     Neuro/Psych Anxiety negative neurological ROS  negative psych ROS   GI/Hepatic Neg liver ROS, S/p bowel injury and ileum resection 5 days ago during elective hysterectomy, now with suspected bowel leak and pelvic fluid abscess   Endo/Other  Hypothyroidism Morbid obesity (super morbid obesity BMI 54)  Renal/GU      Musculoskeletal   Abdominal   Peds  Hematology negative hematology ROS (+)   Anesthesia Other Findings Past Medical History: No date: Anxiety No date: Hypothyroidism No date: Thyroid disease  Past Surgical History: 07/22/2019: BOWEL RESECTION; N/A     Comment:  Procedure: SMALL BOWEL RESECTION;  Surgeon: Ward,               Honor Loh, MD;  Location: [facility] ORS;  Service: Gynecology;              Laterality: N/A; No date: CHOLECYSTECTOMY No date: ECTOPIC PREGNANCY SURGERY 07/22/2019: HYSTERECTOMY ABDOMINAL WITH SALPINGECTOMY; Bilateral     Comment:  Procedure: HYSTERECTOMY ABDOMINAL WITH lbilateral               SALPINGECTOMY, removal of left anterior cul de sac               nodule, cystoscopy;  Surgeon: Ward, Honor Loh, MD;                Location: [facility] ORS;  Service: Gynecology;  Laterality:               Bilateral; 07/22/2019: LAPAROTOMY; N/A     Comment:  Procedure: LAPAROTOMY;  Surgeon: Ward, Honor Loh, MD;                Location: [facility] ORS;  Service: Gynecology;  Laterality:               N/A; 07/22/2019: ROBOTIC ASSISTED TOTAL HYSTERECTOMY WITH BILATERAL SALPINGO  OOPHERECTOMY; Bilateral     Comment:  Procedure: XI ROBOTIC ASSISTED TOTAL HYSTERECTOMY WITH               BILATERAL SALPINGECTOMY _ATTEMPTED;  Surgeon: Ward,               Honor Loh, MD;  Location: [facility] ORS;  Service: Gynecology;              Laterality: Bilateral;  BMI    Body Mass Index: 53.75 kg/m      Reproductive/Obstetrics negative OB ROS                           Anesthesia Physical Anesthesia Plan  ASA: III  Anesthesia Plan: General ETT, Rapid Sequence and Cricoid Pressure   Post-op Pain Management:    Induction:   PONV Risk Score and Plan: Ondansetron, Dexamethasone, Midazolam, Treatment may vary due to age or medical condition and Scopolamine patch - Pre-op  Airway Management Planned:   Additional Equipment:   Intra-op Plan:   Post-operative Plan:   Informed Consent: I have reviewed the patients History and Physical, chart, labs  and discussed the procedure including the risks, benefits and alternatives for the proposed anesthesia with the patient or authorized representative who has indicated his/her understanding and acceptance.     Dental Advisory Given  Plan Discussed with: Anesthesiologist  Anesthesia Plan Comments:         Anesthesia Quick Evaluation

## 2019-07-27 NOTE — Progress Notes (Signed)
MD Ward paged. Received orders to change Potassium to IV form due to pt not able to take PO. Pharmacy was called for input and per Pharmacist, the fastest IV replacement is the 10 mEq IV 100 mls/hr. Orders placed See [facility]

## 2019-07-27 NOTE — Progress Notes (Addendum)
Subjective:  CC: Karen Aguirre is a 44 y.o. female  Hospital stay day 5, 5 Days Post-Op exploratory laparotomy and repair of small bowel enterotomy as well as hysterectomy with OB/GYN  HPI: Pt re-examined after discussion with primary physician and concern for worsening clinical picture with the pelvic fluid collection.  Pt states overall no change since last exam, except she has not passed any flatus and dealing with persistent nausea.  ROS:  General: Denies weight loss, weight gain, fatigue, fevers, chills, and night sweats. Heart: Denies chest pain, palpitations, racing heart, irregular heartbeat, leg pain or swelling, and decreased activity tolerance. Respiratory: Denies breathing difficulty, shortness of breath, wheezing, cough, and sputum. GI: Denies change in appetite, heartburn, vomiting, constipation, diarrhea, and blood in stool. GU: Denies difficulty urinating, pain with urinating, urgency, frequency, blood in urine.   Objective:   Temp:  [98.3 F (36.8 C)-100.7 F (38.2 C)] 98.9 F (37.2 C) (03/13 1314) Pulse Rate:  [115-129] 115 (03/13 1314) Resp:  [16-20] 16 (03/13 1314) BP: (108-138)/(52-72) 130/52 (03/13 1314) SpO2:  [91 %-100 %] 100 % (03/13 1314)     Height: 5\' 2"  (157.5 cm) Weight: 133.3 kg BMI (Calculated): 53.74   Intake/Output this shift:   Intake/Output Summary (Last 24 hours) at 07/27/2019 2014 Last data filed at 07/27/2019 1741 Gross per 24 hour  Intake 5401.86 ml  Output 250 ml  Net 5151.86 ml  200 mL of bilious output from IR drain  Constitutional :  alert, cooperative, appears stated age and mild distress  Respiratory:  clear to auscultation bilaterally  Cardiovascular:  regular rate and rhythm  Gastrointestinal: Soft, increased guarding and tenderness to palpation in left lower quadrant and suprapubic area.  Midline incision with open wound at inferior aspect with minimal serous drainage.  Port sites remain clean dry and intact.  IR drain still with  bilious leakage..   Skin: Cool and moist.   Psychiatric: Normal affect, non-agitated, not confused       LABS:  CMP Latest Ref Rng & Units 07/27/2019 07/26/2019 07/25/2019  Glucose 70 - 99 mg/dL 124(H) 83 105(H)  BUN 6 - 20 mg/dL 8 6 8   Creatinine 0.44 - 1.00 mg/dL 0.53 0.53 0.69  Sodium 135 - 145 mmol/L 135 137 137  Potassium 3.5 - 5.1 mmol/L 2.7(LL) 3.0(L) 4.4  Chloride 98 - 111 mmol/L 101 105 104  CO2 22 - 32 mmol/L 25 21(L) 26  Calcium 8.9 - 10.3 mg/dL 7.7(L) 7.5(L) 7.8(L)  Total Protein 6.5 - 8.1 g/dL - 5.6(L) -  Total Bilirubin 0.3 - 1.2 mg/dL - 1.4(H) -  Alkaline Phos 38 - 126 U/L - 59 -  AST 15 - 41 U/L - 15 -  ALT 0 - 44 U/L - 19 -   CBC Latest Ref Rng & Units 07/27/2019 07/26/2019 07/26/2019  WBC 4.0 - 10.5 K/uL 14.1(H) 9.5 19.4(H)  Hemoglobin 12.0 - 15.0 g/dL 9.1(L) 9.5(L) 9.0(L)  Hematocrit 36.0 - 46.0 % 28.0(L) 29.3(L) 27.4(L)  Platelets 150 - 400 K/uL 366 368 326    RADS: CLINICAL DATA:  Tachycardia.  History of recent surgery.  EXAM: BILATERAL LOWER EXTREMITY VENOUS DOPPLER ULTRASOUND  TECHNIQUE: Gray-scale sonography with compression, as well as color and duplex ultrasound, were performed to evaluate the deep venous system(s) from the level of the common femoral vein through the popliteal and proximal calf veins.  COMPARISON:  None.  FINDINGS: VENOUS  Normal compressibility of the common femoral, superficial femoral, and popliteal veins, as well as the visualized  calf veins. Visualized portions of profunda femoral vein and great saphenous vein unremarkable. No filling defects to suggest DVT on grayscale or color Doppler imaging. Doppler waveforms show normal direction of venous flow, normal respiratory phasicity and response to augmentation.  Limited views of the contralateral common femoral vein are unremarkable.  OTHER  Edema noted in the legs bilaterally.  Limitations: none  IMPRESSION: No femoropopliteal DVT nor evidence of DVT  within the visualized calf veins.  If clinical symptoms are inconsistent or if there are persistent or worsening symptoms, further imaging (possibly involving the iliac veins) may be warranted.   Electronically Signed   By: Lajean Manes M.D.   On: 07/27/2019 16:16  Assessment:   Patient clinical exam is now worse compared to the earlier exam.  She is having increasing tenderness to palpation in the left lower quadrant away from where the drain was placed.  Due to the persistent tachycardia with increased leukocytosis this morning possibility of reexploration was discussed extensively with primary physician.  In the a.m. we have decided that reexploration will probably be in the patient's best interest.  Anesthesia raise concerns of her low potassium causing anesthesia complications, will determine the best timing of actual intervention and proceed with diagnostic laparoscopy, possible abscess drainage possible bowel resection and reanastomosis.  Care for this encounter lasting >13min.

## 2019-07-27 NOTE — Progress Notes (Signed)
Patient verbalizes being nauseous. Refuses to take PO meds. Patient education on pain medications and she verbalizes understanding. She states she is afraid that she will get sick again and vomit. MD Ward made aware. Later on today, she complained of feeling sweating and she requested Temp to be checked Temp found 100.7 axillary. Tylenol given and effectiveTemp down to 98.9 oral on recheck. HR in 120's MD Ward paged and made aware. Also, ok with with Dr to switch LR to 1/5 NS  So it is compatible with zosyn but keep same rate of 150 ml/hr. Orders placed see MAR

## 2019-07-27 NOTE — Progress Notes (Signed)
Patient returned from Ultrasound. Complains of nausea. Pt not able to take potassium PO right now. Phenergan IV PRN given.  Will offer potassium again MD Ward made aware.

## 2019-07-27 NOTE — Progress Notes (Signed)
Subjective:  CC: Karen Aguirre is a 44 y.o. female  Hospital stay day 5, 5 Days Post-Op exploratory laparotomy and repair of small bowel enterotomy as well as hysterectomy with OB/GYN  HPI: Patient states she is feeling better than yesterday.  Reports bowel movement.  Despite this, patient had a reported fever of 100.7 and remains persistently tachycardic.  Also complains of some shortness of breath and reported O2 desaturation down into the low 90s percent requiring 2 L nasal cannula.  ROS:  General: Denies weight loss, weight gain, fatigue, fevers, chills, and night sweats. Heart: Denies chest pain, palpitations, racing heart, irregular heartbeat, leg pain or swelling, and decreased activity tolerance. Respiratory: Denies breathing difficulty, shortness of breath, wheezing, cough, and sputum. GI: Denies change in appetite, heartburn, nausea, vomiting, constipation, diarrhea, and blood in stool. GU: Denies difficulty urinating, pain with urinating, urgency, frequency, blood in urine.   Objective:   Temp:  [98.1 F (36.7 C)-100.7 F (38.2 C)] 98.9 F (37.2 C) (03/13 1314) Pulse Rate:  [115-134] 115 (03/13 1314) Resp:  [16-32] 16 (03/13 1314) BP: (108-143)/(52-102) 130/52 (03/13 1314) SpO2:  [91 %-100 %] 100 % (03/13 1314)     Height: 5\' 2"  (157.5 cm) Weight: 133.3 kg BMI (Calculated): 53.74   Intake/Output this shift:   Intake/Output Summary (Last 24 hours) at 07/27/2019 1434 Last data filed at 07/27/2019 1351 Gross per 24 hour  Intake 4792.05 ml  Output 250 ml  Net 4542.05 ml  200 mL of bilious output from IR drain  Constitutional :  alert, cooperative, appears stated age and no distress  Respiratory:  clear to auscultation bilaterally  Cardiovascular:  regular rate and rhythm  Gastrointestinal: Soft, mild guarding with tenderness to palpation in left lower quadrant and suprapubic area.  Midline incision with open wound at inferior aspect with minimal serous drainage.  Port  sites remain clean dry and intact.  IR drain with bilious leakage..   Skin: Cool and moist.   Psychiatric: Normal affect, non-agitated, not confused       LABS:  CMP Latest Ref Rng & Units 07/27/2019 07/26/2019 07/25/2019  Glucose 70 - 99 mg/dL 124(H) 83 105(H)  BUN 6 - 20 mg/dL 8 6 8   Creatinine 0.44 - 1.00 mg/dL 0.53 0.53 0.69  Sodium 135 - 145 mmol/L 135 137 137  Potassium 3.5 - 5.1 mmol/L 2.7(LL) 3.0(L) 4.4  Chloride 98 - 111 mmol/L 101 105 104  CO2 22 - 32 mmol/L 25 21(L) 26  Calcium 8.9 - 10.3 mg/dL 7.7(L) 7.5(L) 7.8(L)  Total Protein 6.5 - 8.1 g/dL - 5.6(L) -  Total Bilirubin 0.3 - 1.2 mg/dL - 1.4(H) -  Alkaline Phos 38 - 126 U/L - 59 -  AST 15 - 41 U/L - 15 -  ALT 0 - 44 U/L - 19 -   CBC Latest Ref Rng & Units 07/27/2019 07/26/2019 07/26/2019  WBC 4.0 - 10.5 K/uL PENDING 9.5 19.4(H)  Hemoglobin 12.0 - 15.0 g/dL 9.1(L) 9.5(L) 9.0(L)  Hematocrit 36.0 - 46.0 % 28.0(L) 29.3(L) 27.4(L)  Platelets 150 - 400 K/uL 366 368 326    RADS: Pending bilateral lower extremity ultrasound Assessment:   exploratory laparotomy and repair of small bowel enterotomy as well as hysterectomy with OB/GYN  Patient remains tachycardic despite reported feeling better.  Exam concerning for persistent tenderness within the lower abdominal region with drain output similar in appearance to bilious output.  Pending white count, but will have to keep a very close eye on her to ensure  that the output continues to cleared up and does not remain bilious which can be concerning for a possible small anastomotic leak.  Persistent tachycardia and low-grade fever also is concerning for possible postoperative issues such as PE secondary to her prolonged surgery.  We will proceed with bilateral lower extremity ultrasound and a chest x-ray.  She has been on some extensive fluid resuscitation with over 12 L positive I's and O's.  Will discuss with primary to see if fluid rate can potentially be decreased.

## 2019-07-28 ENCOUNTER — Inpatient Hospital Stay: Payer: Managed Care, Other (non HMO)

## 2019-07-28 LAB — CBC WITH DIFFERENTIAL/PLATELET
Abs Immature Granulocytes: 0.92 10*3/uL — ABNORMAL HIGH (ref 0.00–0.07)
Basophils Absolute: 0.1 10*3/uL (ref 0.0–0.1)
Basophils Relative: 1 %
Eosinophils Absolute: 0.1 10*3/uL (ref 0.0–0.5)
Eosinophils Relative: 1 %
HCT: 22.2 % — ABNORMAL LOW (ref 36.0–46.0)
Hemoglobin: 7.3 g/dL — ABNORMAL LOW (ref 12.0–15.0)
Immature Granulocytes: 7 %
Lymphocytes Relative: 9 %
Lymphs Abs: 1.1 10*3/uL (ref 0.7–4.0)
MCH: 29.7 pg (ref 26.0–34.0)
MCHC: 32.9 g/dL (ref 30.0–36.0)
MCV: 90.2 fL (ref 80.0–100.0)
Monocytes Absolute: 0.6 10*3/uL (ref 0.1–1.0)
Monocytes Relative: 5 %
Neutro Abs: 10.2 10*3/uL — ABNORMAL HIGH (ref 1.7–7.7)
Neutrophils Relative %: 77 %
Platelets: 334 10*3/uL (ref 150–400)
RBC: 2.46 MIL/uL — ABNORMAL LOW (ref 3.87–5.11)
RDW: 14.2 % (ref 11.5–15.5)
Smear Review: NORMAL
WBC: 12.9 10*3/uL — ABNORMAL HIGH (ref 4.0–10.5)
nRBC: 0.2 % (ref 0.0–0.2)

## 2019-07-28 LAB — COMPREHENSIVE METABOLIC PANEL
ALT: 18 U/L (ref 0–44)
AST: 18 U/L (ref 15–41)
Albumin: 2 g/dL — ABNORMAL LOW (ref 3.5–5.0)
Alkaline Phosphatase: 47 U/L (ref 38–126)
Anion gap: 8 (ref 5–15)
BUN: 6 mg/dL (ref 6–20)
CO2: 25 mmol/L (ref 22–32)
Calcium: 6.8 mg/dL — ABNORMAL LOW (ref 8.9–10.3)
Chloride: 101 mmol/L (ref 98–111)
Creatinine, Ser: 0.53 mg/dL (ref 0.44–1.00)
GFR calc Af Amer: >60 mL/min (ref >60)
GFR calc non Af Amer: >60 mL/min (ref >60)
Glucose, Bld: 111 mg/dL — ABNORMAL HIGH (ref 70–99)
Potassium: 2.8 mmol/L — ABNORMAL LOW (ref 3.5–5.1)
Sodium: 134 mmol/L — ABNORMAL LOW (ref 135–145)
Total Bilirubin: 0.9 mg/dL (ref 0.3–1.2)
Total Protein: 4.7 g/dL — ABNORMAL LOW (ref 6.5–8.1)

## 2019-07-28 LAB — HEMOGLOBIN AND HEMATOCRIT, BLOOD
HCT: 22.6 % — ABNORMAL LOW (ref 36.0–46.0)
Hemoglobin: 7.3 g/dL — ABNORMAL LOW (ref 12.0–15.0)

## 2019-07-28 LAB — PHOSPHORUS: Phosphorus: 1.6 mg/dL — ABNORMAL LOW (ref 2.5–4.6)

## 2019-07-28 LAB — MAGNESIUM: Magnesium: 1.8 mg/dL (ref 1.7–2.4)

## 2019-07-28 MED ORDER — DEXMEDETOMIDINE HCL 200 MCG/2ML IV SOLN
INTRAVENOUS | Status: DC | PRN
  Administered 2019-07-28: 8 ug via INTRAVENOUS
  Administered 2019-07-28: 20 ug via INTRAVENOUS
  Administered 2019-07-28: 12 ug via INTRAVENOUS

## 2019-07-28 MED ORDER — ALBUMIN HUMAN 5 % IV SOLN
12.5000 g | Freq: Once | INTRAVENOUS | Status: AC
  Administered 2019-07-28: 12.5 g via INTRAVENOUS
  Filled 2019-07-28: qty 250

## 2019-07-28 MED ORDER — SCOPOLAMINE 1 MG/3DAYS TD PT72
1.0000 | MEDICATED_PATCH | TRANSDERMAL | Status: DC
  Administered 2019-07-28 – 2019-08-12 (×6): 1.5 mg via TRANSDERMAL
  Filled 2019-07-28 (×7): qty 1

## 2019-07-28 MED ORDER — METHYLENE BLUE 0.5 % INJ SOLN
INTRAVENOUS | Status: AC
  Filled 2019-07-28: qty 10

## 2019-07-28 MED ORDER — LIDOCAINE HCL (PF) 2 % IJ SOLN
INTRAMUSCULAR | Status: AC
  Filled 2019-07-28: qty 10

## 2019-07-28 MED ORDER — POTASSIUM PHOSPHATES 15 MMOLE/5ML IV SOLN: 30.0000 mmol | INTRAVENOUS | Status: DC

## 2019-07-28 MED ORDER — IBUPROFEN 400 MG PO TABS: 800.0000 mg | ORAL_TABLET | Freq: Four times a day (QID) | ORAL | Status: DC

## 2019-07-28 MED ORDER — FUROSEMIDE 10 MG/ML IJ SOLN
INTRAMUSCULAR | Status: AC
  Administered 2019-07-28: 05:00:00 20 mg via INTRAVENOUS
  Filled 2019-07-28: qty 2

## 2019-07-28 MED ORDER — SUGAMMADEX SODIUM 200 MG/2ML IV SOLN
INTRAVENOUS | Status: DC | PRN
  Administered 2019-07-28: 200 mg via INTRAVENOUS

## 2019-07-28 MED ORDER — MAGNESIUM SULFATE 2 GM/50ML IV SOLN
2.0000 g | Freq: Once | INTRAVENOUS | Status: AC
  Administered 2019-07-28: 17:00:00 2 g via INTRAVENOUS
  Filled 2019-07-28: qty 50

## 2019-07-28 MED ORDER — ENOXAPARIN SODIUM 40 MG/0.4ML ~~LOC~~ SOLN
40.0000 mg | Freq: Two times a day (BID) | SUBCUTANEOUS | Status: DC
  Administered 2019-07-28 – 2019-07-30 (×4): 40 mg via SUBCUTANEOUS
  Filled 2019-07-28 (×4): qty 0.4

## 2019-07-28 MED ORDER — PHENYLEPHRINE HCL (PRESSORS) 10 MG/ML IV SOLN
INTRAVENOUS | Status: AC
  Filled 2019-07-28: qty 1

## 2019-07-28 MED ORDER — FUROSEMIDE 10 MG/ML IJ SOLN: 20.0000 mg | Freq: Once | INTRAMUSCULAR | Status: AC

## 2019-07-28 MED ORDER — POTASSIUM PHOSPHATES 15 MMOLE/5ML IV SOLN
30.0000 mmol | Freq: Once | INTRAVENOUS | Status: AC
  Administered 2019-07-28: 30 mmol via INTRAVENOUS
  Filled 2019-07-28: qty 10

## 2019-07-28 MED ORDER — IPRATROPIUM-ALBUTEROL 0.5-2.5 (3) MG/3ML IN SOLN
3.0000 mL | RESPIRATORY_TRACT | Status: DC | PRN
  Administered 2019-07-28: 3 mL via RESPIRATORY_TRACT
  Filled 2019-07-28: qty 3

## 2019-07-28 MED ORDER — CHLORHEXIDINE GLUCONATE CLOTH 2 % EX PADS
6.0000 | MEDICATED_PAD | Freq: Every day | CUTANEOUS | Status: DC
  Administered 2019-07-28 – 2019-07-30 (×3): 6 via TOPICAL

## 2019-07-28 MED ORDER — IPRATROPIUM-ALBUTEROL 0.5-2.5 (3) MG/3ML IN SOLN
RESPIRATORY_TRACT | Status: AC
  Administered 2019-07-28: 3 mL
  Filled 2019-07-28: qty 3

## 2019-07-28 MED ORDER — ROCURONIUM BROMIDE 10 MG/ML (PF) SYRINGE
PREFILLED_SYRINGE | INTRAVENOUS | Status: AC
  Filled 2019-07-28: qty 20

## 2019-07-28 MED ORDER — POTASSIUM CHLORIDE CRYS ER 10 MEQ PO TBCR
20.0000 meq | EXTENDED_RELEASE_TABLET | Freq: Once | ORAL | Status: DC
  Filled 2019-07-28: qty 2

## 2019-07-28 MED ORDER — FUROSEMIDE 10 MG/ML IJ SOLN
20.0000 mg | Freq: Two times a day (BID) | INTRAMUSCULAR | Status: DC
  Administered 2019-07-28 – 2019-08-05 (×15): 20 mg via INTRAVENOUS
  Filled 2019-07-28 (×16): qty 4

## 2019-07-28 MED ORDER — HYDROMORPHONE HCL 1 MG/ML IJ SOLN
0.2500 mg | INTRAMUSCULAR | Status: DC | PRN
  Administered 2019-07-28 (×3): 0.25 mg via INTRAVENOUS

## 2019-07-28 MED ORDER — SUCCINYLCHOLINE CHLORIDE 20 MG/ML IJ SOLN
INTRAMUSCULAR | Status: AC
  Filled 2019-07-28: qty 1

## 2019-07-28 MED ORDER — HYDROMORPHONE HCL 1 MG/ML IJ SOLN
0.2000 mg | INTRAMUSCULAR | Status: DC | PRN
  Administered 2019-07-28 (×2): 0.2 mg via INTRAVENOUS
  Administered 2019-07-28: 0.6 mg via INTRAVENOUS
  Administered 2019-07-29 (×2): 0.2 mg via INTRAVENOUS
  Filled 2019-07-28 (×5): qty 1

## 2019-07-28 MED ORDER — HYDROMORPHONE HCL 1 MG/ML IJ SOLN
INTRAMUSCULAR | Status: AC
  Filled 2019-07-28: qty 1

## 2019-07-28 MED ORDER — FIBRIN SEALANT 2 ML SINGLE DOSE KIT
PACK | CUTANEOUS | Status: DC | PRN
  Administered 2019-07-28: 2 mL via TOPICAL

## 2019-07-28 MED ORDER — HYDROMORPHONE HCL 2 MG PO TABS: 2.0000 mg | ORAL_TABLET | ORAL | Status: DC | PRN

## 2019-07-28 MED ORDER — FUROSEMIDE 10 MG/ML IJ SOLN: 20.0000 mg | Freq: Two times a day (BID) | INTRAMUSCULAR | Status: DC

## 2019-07-28 MED ORDER — PROMETHAZINE HCL 25 MG/ML IJ SOLN: 6.2500 mg | INTRAMUSCULAR | Status: DC | PRN

## 2019-07-28 MED ORDER — SODIUM CHLORIDE 0.9 % IV SOLN: INTRAVENOUS | Status: DC

## 2019-07-28 MED ORDER — ACETAMINOPHEN 500 MG PO TABS
1000.0000 mg | ORAL_TABLET | Freq: Four times a day (QID) | ORAL | Status: DC
  Filled 2019-07-28 (×5): qty 2

## 2019-07-28 MED ORDER — OXYBUTYNIN 3.9 MG/24HR TD PTTW
1.0000 | MEDICATED_PATCH | TRANSDERMAL | Status: DC
  Administered 2019-07-28: 1 via TRANSDERMAL
  Filled 2019-07-28: qty 1

## 2019-07-28 MED ORDER — ROCURONIUM BROMIDE 10 MG/ML (PF) SYRINGE
PREFILLED_SYRINGE | INTRAVENOUS | Status: AC
  Filled 2019-07-28: qty 10

## 2019-07-28 MED ORDER — CEFAZOLIN SODIUM 1 G IJ SOLR
INTRAMUSCULAR | Status: AC
  Filled 2019-07-28: qty 20

## 2019-07-28 MED ORDER — PROPOFOL 10 MG/ML IV BOLUS
INTRAVENOUS | Status: AC
  Filled 2019-07-28: qty 20

## 2019-07-28 MED ORDER — ONDANSETRON HCL 4 MG/2ML IJ SOLN
INTRAMUSCULAR | Status: AC
  Filled 2019-07-28: qty 2

## 2019-07-28 MED ORDER — KETOROLAC TROMETHAMINE 30 MG/ML IJ SOLN
30.0000 mg | Freq: Four times a day (QID) | INTRAMUSCULAR | Status: DC
  Administered 2019-07-28: 30 mg via INTRAVENOUS
  Filled 2019-07-28: qty 1

## 2019-07-28 NOTE — Anesthesia Procedure Notes (Signed)
Procedure Name: Intubation Date/Time: 07/27/2019 1:24 AM Performed by: Justus Memory, CRNA Pre-anesthesia Checklist: Patient identified, Patient being monitored, Timeout performed, Emergency Drugs available and Suction available Patient Re-evaluated:Patient Re-evaluated prior to induction Oxygen Delivery Method: Circle system utilized Preoxygenation: Pre-oxygenation with 100% oxygen Induction Type: IV induction Ventilation: Mask ventilation without difficulty Laryngoscope Size: Mac and 3 Grade View: Grade I Tube type: Oral Tube size: 7.0 mm Number of attempts: 1 Airway Equipment and Method: Stylet Placement Confirmation: ETT inserted through vocal cords under direct vision,  positive ETCO2 and breath sounds checked- equal and bilateral Secured at: 21 cm Tube secured with: Tape Dental Injury: Teeth and Oropharynx as per pre-operative assessment and Bloody posterior oropharynx  Difficulty Due To: Difficult Airway- due to large tongue and Difficult Airway- due to anterior larynx Future Recommendations: Recommend- induction with short-acting agent, and alternative techniques readily available

## 2019-07-28 NOTE — Op Note (Addendum)
Preoperative diagnosis: Acute abdomen  Postoperative diagnosis: Same with anastomotic leak  Procedure: Exploratory laparotomy, lysis of adhesions, abdominal wound washout, resection of previous anastomosis, subsequent reanastomosis  Anesthesia: GETA  Surgeon: Lysle Pearl Assistant: Ward for better exposure  Wound Classification: Contaminated  Specimen: Small bowel  Complications: None  EBL: 200 mL  Indications:  Patient is a 44 y.o. female that underwent hysterectomy and small bowel resection with anastomosis secondary to small bowel injury.  Postoperatively patient clinically continued to decline to concern of possible anastomosis leak and acute abdomen.  Patient was brought back to the OR for further exploration.  Please see H&P for further details.  Description of procedure: Please refer to Dr. Guido Sander operative note for the initial portion of the exam.  My main role for this procedure was the inspection of bowel, lysis of adhesions, resection of previous small bowel anastomosis, and reanastomosis.  Once the abdominal cavity was entered, immediate copious amounts of purulent fluid was noted mainly from the mid upper portion of the abdomen.  This was noted underneath a severely inflamed and thickened omentum that was encasing her entire small bowel.  Additional inspection of the pelvic area noted a old hematoma which was the likely source of the visualized fluid collection on the previous CT scan Finger fracturing and very meticulous lysis of adhesions and gentle traction was used to separate the omentum from the small bowel deep to it.    Once adequate dissection and retraction was achieved through the hour-long process of lysis of adhesions and removing the omentum from the inflamed and thickened small bowel loops, the area with the most purulent drainage was noted to encase the previous small bowel anastomosis.  There was a clear perforation at the anastomotic staple line.  Decision was made at  this point to resect the damaged bowel and create a new anastomosis.  An area of the bowel with pink, nondilated, somewhat less edematous wall both proximal and distal to the old anastomosis was chosen as a transection point.  Hole was made at the base of the bowel in the mesentery and a linear 65 mm GIA stapler was used to transect the bowel.  LigaSure was then used to connect the 2 areas of transection and the damaged old anastomosis along with roughly 20 cm of small bowel was removed off operative field pending pathology.  The 2 transected ends aligned well in a isoperistaltic fashion, so a isoperistaltic side-to-side anastomosis was created.  The mesentery for both ends were severely shortened and thickened, but the ends remained viable throughout the creation of the anastomosis.  An enterotomy was made in the ends of the 2 bowel segments and a 65 mm linear GIA stapler was placed into the lumens and a new anastomosis was created.  The enterotomies made to place the stapler through was then closed in a 2 layer fashion using running 3-0 Vicryl for the initial layer and then a Lembert closure was performed using the same 3-0 Vicryl.  Additional areas were reinforced with 3-0 silk sutures in a Lembert fashion.  The remaining staple line was also further reinforced using 3-0 silk interrupteds in a Lembert fashion.  At the end of the this portion of procedure no obvious staple lines were visible, the bowels mesentery was not noted to be twisted, and adequate perfusion was noted at the newly created anastomosis line indicated by pink healthy bowel with minimal additional swelling.  The case was then primarily turned over back to Dr. Leonides Schanz to complete  the rest of the portion.  I remained as an Environmental consultant for her towards the end of the procedure, until I left the room while she was still in the process of closing the fascia.Marland Kitchen

## 2019-07-28 NOTE — Progress Notes (Signed)
While trying to take oral potassium the patient vomited a large amount of green, brown bile like substance.Patient cleaned up. Dr Leonides Schanz notified about emesis and patient being unable to take oral potassium.

## 2019-07-28 NOTE — Op Note (Signed)
Karen Aguirre PROCEDURE DATE: 07/27/2019 - 07/28/2019  PATIENT:  Karen Aguirre  44 y.o. female  PRE-OPERATIVE DIAGNOSIS:  possible anastomosis leak, s/p small bowel resection 5 days ago.  POST-OPERATIVE DIAGNOSIS:  anastomosis leak, intraabdominal infection, small bowel resection   PROCEDURE:  Procedure(s): EXAM UNDER ANESTHESIA (N/A) EXPLORATORY LAPAROTOMY (N/A) SMALL BOWEL RESECTION  SURGEON:  Surgeon(s) and Role:    * Shyler Holzman, Honor Loh, MD - Primary    * Sakai, Isami, DO - Assisting  ANESTHESIA:  General via ET  I/O  Total I/O In: 2380.2 [I.V.:2000; IV Piggyback:380.2] Out: 200 [Blood:200]  FINDINGS:    1. No pus or any abnormal vagina discharge.  Cuff intact vaginally. 2. Very small amount of green tinged fluid on superior apex of ABD pad.   3. Skin and subcutaneous tissues without infection or necrotic tissue 4. Fascia intact 5. Omentum edematous inflamed, and friable, stuck to anterior abdominal wall, fixed.  Would fracture and bleed with any manipulation. 6. Small bowel loops and mesentery inflamed, in many areas coated in superficial inflammatory rind. 7. Pocket of purulent fluid in LUQ and between bowel loops on left 8. Pocket of serosanguinous fluid in pelvis, no purulence.  9. Breakdown of staple line on proximal end of anastomosis.  Distal end intact. 10. 20cm of edematous and rubrous bowel removed, including anastamosis site. Distal small bowel (to terminal ileum) was pink and healthy appearing.  Mesentery defect previously repaired was also intact.  11. Sigmoid uninvolved in inflammatory reaction.   12. Side-to-side new anastomosis patent and without tension. 13. No evidence of bladder leakage or injury  SPECIMEN: portion of small bowel  COMPLICATIONS: none apparent  DISPOSITION: vital signs stable to PACU   Indication for Surgery: 44 y.o.  Who was scheduled for a robotic-assisted laparoscopic hysterectomy on 07/22/19, and at the beginning of the surgery was  noted to have very dense adhesions of the small bowel to the anterior abdominal wall, with a full thickenes luminal small bowel injury during take-down.  The remainder of the bowel was taken down laparoscopically and most of the hysterectomy.  She was then opened midline and Dr. Celine Ahr performed an end-to-end, side-to-side primary anasamosis.  Follwing the surgery, Karen Aguirre's disposition did not dramatically improve, and CT showed two discrete fluid pockets.  IR placed a drain in one of the pockets, which returned brown fluid which evolved into green and became concerning for a anastomotic leak.  She was febrile and remained tachycardic and breathing became labored.  She also developed left shoulder pain.  She was taken back to the OR today for re-exploration and exam under anesthesia.   Risks of surgery were discussed with the patient including but not limited to: bleeding which may require transfusion or reoperation; infection which may require antibiotics; injury to bowel, bladder, ureters or other surrounding organs; need for additional procedures including blood clot, incisional problems and other postoperative/anesthesia complications. Written informed consent was obtained.   PROCEDURE IN DETAIL:  The patient had sequential compression devices applied to her lower extremities while in the preoperative area.  She was then taken to the operating room where general anesthesia was administered and was found to be adequate.  She was kept supine, and a foley catheter was placed. After a surgical timeout was performed, a vaginal exam was performed and was without any contributory findings.    There were two plans of approach discussed, one laparoscopic and one open.  When patient was relaxed and being prepped, we noted some green/brown  material with debris at the superior apex of the incision, and decided to open.   The initial skin incision was reopened and the subcutaneous and fascia were also opened by removing  the pds suture.  Upon opening this layer it was apparent that there was a bowel perforation and infection.    The LUQ had a large pocket of purulent material.  The pelvis contained a large pocket of brown serosanguinous fluid, no pus. The drain was placed in a right mid pocket of fluid.  The omentum and small bowel were matted together and adherent as a single unit.  With careful exploration, the layers were able to be separated some, and eventually uncovered the site of the perforation, which was the staple line at the proximal end of the anastomosis.  Further dissection needed to be made in order to both approach this anastomosis, as well as consider to free some more ileum to attempt another anastomosis.  This procedure was exceptionally delicate and progressed slowly.    Once the bowel and mesentery were freed enough to mobilize, the proximal end was traced back to an area that was neither dusky nor edematous.  The distal end was healthy appearing.  Thus, the cut lines for the bowel was about 20cm, and mostly proximal in direction.  The terminal ileum remained intact.  GIA stapler was used throughout with blue staple loads (2.5).  A small hole was made in the mesentery just beneath the serosa of the bowel, and the LIgasure impact was used to seal and divide the mesentery to facilitate removal.  The two neo-ends were then orientated side by side, and the GIA was used to adjoin them.  The interior was patent, and without defect.  Silk sutures were placed along all staple lines to imbricate and secure edges.    Copious warm fluid was poured into the abdomen for a washout, and once clear, w focused on closure.  The fascia was closed with a running stitch of looped PDS, with occasional mass-closure stitches of Maxon.  These were done from each apex and met in the middle.  The subcutaneous layer was reapproximated with vicryl and the skin closed with vicryl subdermal stitches.  A Honeycomb dressing was applied.   Her right sided drain was kept in place.   The patient tolerated the procedures well.  All instruments, needles, and sponge counts were correct x 2. The patient was taken to the recovery room in stable condition.   ---- Larey Days, MD Attending Obstetrician and [facility]

## 2019-07-28 NOTE — Progress Notes (Signed)
MD aware

## 2019-07-28 NOTE — Progress Notes (Signed)
MD aware of heart rate. HR has been elevated since Friday

## 2019-07-28 NOTE — Progress Notes (Signed)
Subjective:  CC: Karen Aguirre is a 44 y.o. female  Hospital stay day 6,  exploratory laparotomy and repair of small bowel enterotomy as well as hysterectomy with OB/GYN  1 Day Post-Op  Reexploration abdominal washout and resection of original anastomosis   HPI: No acute events postop.  Patient states pain is slightly better today.  ROS:  General: Denies weight loss, weight gain, fatigue, fevers, chills, and night sweats. Heart: Denies chest pain, palpitations, racing heart, irregular heartbeat, leg pain or swelling, and decreased activity tolerance. Respiratory: Denies breathing difficulty, shortness of breath, wheezing, cough, and sputum. GI: Denies change in appetite, heartburn, vomiting, constipation, diarrhea, and blood in stool. GU: Denies difficulty urinating, pain with urinating, urgency, frequency, blood in urine.   Objective:   Temp:  [97.6 F (36.4 C)-98.9 F (37.2 C)] 98.2 F (36.8 C) (03/14 1317) Pulse Rate:  [104-116] 116 (03/14 1317) Resp:  [16-20] 16 (03/14 1317) BP: (95-134)/(49-65) 127/65 (03/14 1317) SpO2:  [89 %-100 %] 89 % (03/14 1317)     Height: 5\' 2"  (157.5 cm) Weight: 133.3 kg BMI (Calculated): 53.74   Intake/Output this shift:   Intake/Output Summary (Last 24 hours) at 07/28/2019 1401 Last data filed at 07/28/2019 1200 Gross per 24 hour  Intake 4036.25 ml  Output 955 ml  Net 3081.25 ml  200 mL of bilious output from IR drain  Constitutional :  alert, cooperative, appears stated age and mild distress  Respiratory:  clear to auscultation bilaterally  Cardiovascular:  regular rate and rhythm  Gastrointestinal: Soft, decreased guarding and tenderness to palpation in left lower quadrant and suprapubic area.  Midline incision with minimal bloody discharge, nonindurated.  Port sites remain clean dry and intact.  IR drain still with scant bilious leakage..   Skin: Cool and moist.   Psychiatric: Normal affect, non-agitated, not confused       LABS:   CMP Latest Ref Rng & Units 07/28/2019 07/27/2019 07/27/2019  Glucose 70 - 99 mg/dL 111(H) - 124(H)  BUN 6 - 20 mg/dL 6 - 8  Creatinine 0.44 - 1.00 mg/dL 0.53 - 0.53  Sodium 135 - 145 mmol/L 134(L) - 135  Potassium 3.5 - 5.1 mmol/L 2.8(L) 3.7 2.7(LL)  Chloride 98 - 111 mmol/L 101 - 101  CO2 22 - 32 mmol/L 25 - 25  Calcium 8.9 - 10.3 mg/dL 6.8(L) - 7.7(L)  Total Protein 6.5 - 8.1 g/dL 4.7(L) - -  Total Bilirubin 0.3 - 1.2 mg/dL 0.9 - -  Alkaline Phos 38 - 126 U/L 47 - -  AST 15 - 41 U/L 18 - -  ALT 0 - 44 U/L 18 - -   CBC Latest Ref Rng & Units 07/28/2019 07/27/2019 07/26/2019  WBC 4.0 - 10.5 K/uL 12.9(H) 14.1(H) 9.5  Hemoglobin 12.0 - 15.0 g/dL 7.3(L) 9.1(L) 9.5(L)  Hematocrit 36.0 - 46.0 % 22.2(L) 28.0(L) 29.3(L)  Platelets 150 - 400 K/uL 334 366 368    RADS: N/a  Assessment:   exploratory laparotomy and repair of small bowel enterotomy as well as hysterectomy with OB/GYN  Reexploration abdominal washout and resection of original anastomosis.  Clinical exam better today.  However patient continues to remain tachycardic, with a drop in her hemoglobin levels.  Pending repeat lab draws and possible transfusion as needed.  We will continue close care with the primary surgeon.

## 2019-07-28 NOTE — Progress Notes (Signed)
Pharmacy Electrolyte Monitoring Consult:  Pharmacy consulted to assist in monitoring and replacing electrolytes in this 44 y.o. female admitted on 07/22/2019 with No chief complaint on file.   Labs:  Sodium (mmol/L)  Date Value  07/28/2019 134 (L)   Potassium (mmol/L)  Date Value  07/28/2019 2.8 (L)   Magnesium (mg/dL)  Date Value  07/28/2019 1.8   Phosphorus (mg/dL)  Date Value  07/28/2019 1.6 (L)   Calcium (mg/dL)  Date Value  07/28/2019 6.8 (L)   Albumin (g/dL)  Date Value  07/28/2019 2.0 (L)    Assessment: 44 yo F s/p exp. Lap and repair of small bowel enterotomy as well as hysterectomy with OB/GYN and surgery involved. Reexploration abdominal washout and resection of original anastomosis today 3/14.  Goal for Electrolytes:  K ~4.0,  Mag ~ 2.0, Phos > 2.5  Plan: 3/14  K 2.8  Mag 1.8  Phos 1.6  Scr 0.53  -Will order Potassium Phosphate 30 mmol IV x 1 dose ( has ~44 meq of K) - will order Potassium Chloride PO 20 meq x 1 -Will order Magnesium sulfate 2 gm IV x 1 -will f/u electrolyte labs in am and replace as needed   Chinita Greenland PharmD Clinical Pharmacist 07/28/2019

## 2019-07-28 NOTE — Transfer of Care (Signed)
Immediate Anesthesia Transfer of Care Note  Patient: Karen Aguirre  Procedure(s) Performed: EXAM UNDER ANESTHESIA (N/A ) EXPLORATORY LAPAROTOMY (N/A ) SMALL BOWEL RESECTION  Patient Location: PACU  Anesthesia Type:General  Level of Consciousness: sedated  Airway & Oxygen Therapy: Patient Spontanous Breathing and Patient connected to face mask oxygen  Post-op Assessment: Report given to RN and Post -op Vital signs reviewed and stable  Post vital signs: Reviewed and stable  Last Vitals:  Vitals Value Taken Time  BP 103/59 07/28/19 0513  Temp 37.1 C 07/28/19 0500  Pulse 106 07/28/19 0513  Resp    SpO2 99 % 07/28/19 0513  Vitals shown include unvalidated device data.  Last Pain:  Vitals:   07/28/19 0500  TempSrc:   PainSc: Asleep         Complications: No apparent anesthesia complications

## 2019-07-29 LAB — CBC WITH DIFFERENTIAL/PLATELET
Abs Immature Granulocytes: 1.31 10*3/uL — ABNORMAL HIGH (ref 0.00–0.07)
Basophils Absolute: 0.1 10*3/uL (ref 0.0–0.1)
Basophils Relative: 0 %
Eosinophils Absolute: 0.1 10*3/uL (ref 0.0–0.5)
Eosinophils Relative: 0 %
HCT: 22.3 % — ABNORMAL LOW (ref 36.0–46.0)
Hemoglobin: 7.4 g/dL — ABNORMAL LOW (ref 12.0–15.0)
Immature Granulocytes: 7 %
Lymphocytes Relative: 7 %
Lymphs Abs: 1.4 10*3/uL (ref 0.7–4.0)
MCH: 29.7 pg (ref 26.0–34.0)
MCHC: 33.2 g/dL (ref 30.0–36.0)
MCV: 89.6 fL (ref 80.0–100.0)
Monocytes Absolute: 1.2 10*3/uL — ABNORMAL HIGH (ref 0.1–1.0)
Monocytes Relative: 6 %
Neutro Abs: 15.8 10*3/uL — ABNORMAL HIGH (ref 1.7–7.7)
Neutrophils Relative %: 80 %
Platelets: 438 10*3/uL — ABNORMAL HIGH (ref 150–400)
RBC: 2.49 MIL/uL — ABNORMAL LOW (ref 3.87–5.11)
RDW: 14.1 % (ref 11.5–15.5)
Smear Review: NORMAL
WBC: 19.9 10*3/uL — ABNORMAL HIGH (ref 4.0–10.5)
nRBC: 0.2 % (ref 0.0–0.2)

## 2019-07-29 LAB — BASIC METABOLIC PANEL
Anion gap: 13 (ref 5–15)
BUN: <5 mg/dL — ABNORMAL LOW (ref 6–20)
CO2: 25 mmol/L (ref 22–32)
Calcium: 7 mg/dL — ABNORMAL LOW (ref 8.9–10.3)
Chloride: 97 mmol/L — ABNORMAL LOW (ref 98–111)
Creatinine, Ser: 0.48 mg/dL (ref 0.44–1.00)
GFR calc Af Amer: >60 mL/min (ref >60)
GFR calc non Af Amer: >60 mL/min (ref >60)
Glucose, Bld: 116 mg/dL — ABNORMAL HIGH (ref 70–99)
Potassium: 2.7 mmol/L — CL (ref 3.5–5.1)
Sodium: 135 mmol/L (ref 135–145)

## 2019-07-29 LAB — URINE CULTURE: Culture: NO GROWTH

## 2019-07-29 LAB — MAGNESIUM: Magnesium: 2.3 mg/dL (ref 1.7–2.4)

## 2019-07-29 LAB — POTASSIUM: Potassium: 2.9 mmol/L — ABNORMAL LOW (ref 3.5–5.1)

## 2019-07-29 LAB — PHOSPHORUS: Phosphorus: 1.7 mg/dL — ABNORMAL LOW (ref 2.5–4.6)

## 2019-07-29 MED ORDER — POTASSIUM PHOSPHATES 15 MMOLE/5ML IV SOLN
30.0000 mmol | Freq: Once | INTRAVENOUS | Status: AC
  Administered 2019-07-29: 30 mmol via INTRAVENOUS
  Filled 2019-07-29: qty 10

## 2019-07-29 MED ORDER — POTASSIUM CHLORIDE 10 MEQ/100ML IV SOLN
10.0000 meq | INTRAVENOUS | Status: AC
  Administered 2019-07-29 (×4): 10 meq via INTRAVENOUS
  Filled 2019-07-29 (×4): qty 100

## 2019-07-29 MED ORDER — LORAZEPAM 2 MG/ML IJ SOLN
1.0000 mg | Freq: Every evening | INTRAMUSCULAR | Status: DC | PRN
  Administered 2019-07-30 – 2019-08-18 (×11): 1 mg via INTRAVENOUS
  Filled 2019-07-29 (×11): qty 1

## 2019-07-29 MED ORDER — OXYCODONE HCL 5 MG PO TABS: 5.0000 mg | ORAL_TABLET | ORAL | Status: DC | PRN

## 2019-07-29 MED ORDER — GABAPENTIN 300 MG PO CAPS
300.0000 mg | ORAL_CAPSULE | Freq: Every day | ORAL | Status: DC
  Filled 2019-07-29 (×4): qty 1

## 2019-07-29 MED ORDER — POTASSIUM CHLORIDE 10 MEQ/100ML IV SOLN
10.0000 meq | INTRAVENOUS | Status: AC
  Administered 2019-07-29 – 2019-07-30 (×4): 10 meq via INTRAVENOUS
  Filled 2019-07-29 (×2): qty 100

## 2019-07-29 MED ORDER — KETOROLAC TROMETHAMINE 15 MG/ML IJ SOLN
15.0000 mg | Freq: Four times a day (QID) | INTRAMUSCULAR | Status: AC
  Administered 2019-07-30 – 2019-08-03 (×18): 15 mg via INTRAVENOUS
  Filled 2019-07-29 (×21): qty 1

## 2019-07-29 MED ORDER — OXYCODONE HCL 5 MG PO TABS: 10.0000 mg | ORAL_TABLET | ORAL | Status: DC | PRN

## 2019-07-29 NOTE — Progress Notes (Signed)
Initial Nutrition Assessment  DOCUMENTATION CODES:   Morbid obesity  INTERVENTION:   Recommend TPN if unable to advance diet by 3/16  Pt is at high refeed risk; recommend monitor K, Mg and P labs daily  NUTRITION DIAGNOSIS:   Inadequate oral intake related to acute illness as evidenced by NPO status.  GOAL:   Patient will meet greater than or equal to 90% of their needs  MONITOR:   Diet advancement, Labs, Weight trends, Skin, I & O's  REASON FOR ASSESSMENT:   NPO/Clear Liquid Diet    ASSESSMENT:   44 y/o female with uterine fibroids s/p laparoscopic converted to open total hysterectomy, left salpingectomy, cystoscopy and 25 cm small bowel resection (near distal ileum) secondary to bowel injury 3/8. Pt declined post operatively requiring exploratory laparotomy, lysis of adhesions, abdominal wound washout, resection of previous anastomosis with ~20cm of small bowel removed and subsequent reanastomosis 3/14   Pt NPO x 7 days. Pt documented to have had some nausea and emesis yesterday. Recommend TPN if unable to advance pt's diet by 3/16. Pt is at high refeed risk.   Per chart, pt with weight gain pta.   Medications reviewed and include: lovenox, lasix, synthroid, protonix, NaCl @75ml /hr, zosyn  Labs reviewed: K 2.7(L), BUN <5(L), P 1.7(L), Mg 2.3 wnl Wbc- 19.9(H), Hgb 7.4(L), Hct 22.3(L)  NUTRITION - FOCUSED PHYSICAL EXAM:    Most Recent Value  Orbital Region  No depletion  Upper Arm Region  No depletion  Thoracic and Lumbar Region  No depletion  Buccal Region  No depletion  Edema (RD Assessment)  None  Hair  Reviewed  Eyes  Reviewed  Mouth  Reviewed  Skin  Reviewed  Nails  Reviewed     Diet Order:   Diet Order            Diet NPO time specified Except for: Ice Chips, Sips with Meds, Other (See Comments)  Diet effective now             EDUCATION NEEDS:   No education needs have been identified at this time  Skin:  Skin Assessment: Reviewed RN  Assessment(incision abdomen)  Last BM:  3/13- type 7  Height:   Ht Readings from Last 1 Encounters:  07/23/19 5\' 2"  (1.575 m)    Weight:   Wt Readings from Last 1 Encounters:  07/23/19 133.3 kg    Ideal Body Weight:  50 kg  BMI:  Body mass index is 53.75 kg/m.  Estimated Nutritional Needs:   Kcal:  2400-2700kcal/day  Protein:  120-135g/day  Fluid:  1.8L/day  [name] MS, RD, LDN Contact information available in Amion

## 2019-07-29 NOTE — Progress Notes (Signed)
Paged Dr. Leonides Schanz re: K lab result 2.7

## 2019-07-29 NOTE — Progress Notes (Signed)
Obstetric and Gynecology  POD 1, POD 7  Subjective   Patient reports pain improved from yesterday.  Ambulation yesterday with nursing, walker.  Still somewhat lightheaded with ambulation.    Abdominal distention has improved with time, simethicone, and ambulation.  No flatus.  No increase in belching.    Foley in place  Denies CP, SOB, F/C, N/V/D, or leg pain.   Objective   Objective:   Vitals:   07/28/19 1317 07/28/19 1404 07/28/19 2212 07/29/19 0604  BP: 127/65  (!) 110/59 131/62  Pulse: (!) 116 (!) 112 (!) 125 (!) 112  Resp: 16  18 20   Temp: 98.2 F (36.8 C)  98 F (36.7 C) 99.5 F (37.5 C)  TempSrc: Oral  Oral Oral  SpO2: (!) 89% 94% 98% 94%  Weight:      Height:        Intake/Output Summary (Last 24 hours) at 07/29/2019 1239 Last data filed at 07/29/2019 1046 Gross per 24 hour  Intake 445.02 ml  Output 1885 ml  Net -1439.98 ml   General: NAD Cardiovascular: RRR, no murmurs Pulmonary: CTAB, normal respiratory effort Abdomen: appropriately tender, moderately distended, +BS, no guarding.  Incision: c/d/i, covered by abdominal binder.  ABD pad has two spots of serosanguinous fluid   Extremities: No erythema or cords, no calf tenderness, with normal peripheral pulses.  Labs: Results for orders placed or performed during the hospital encounter of 07/22/19 (from the past 24 hour(s))  Hemoglobin and hematocrit, blood     Status: Abnormal   Collection Time: 07/28/19  1:47 PM  Result Value Ref Range   Hemoglobin 7.3 (L) 12.0 - 15.0 g/dL   HCT 22.6 (L) 36.0 - [id number] %  Basic metabolic panel     Status: Abnormal   Collection Time: 07/29/19  5:44 AM  Result Value Ref Range   Sodium 135 135 - 145 mmol/L   Potassium 2.7 (LL) 3.5 - 5.1 mmol/L   Chloride 97 (L) 98 - 111 mmol/L   CO2 25 22 - 32 mmol/L   Glucose, Bld 116 (H) 70 - 99 mg/dL   BUN <5 (L) 6 - 20 mg/dL   Creatinine, Ser 0.48 0.44 - 1.00 mg/dL   Calcium 7.0 (L) 8.9 - 10.3 mg/dL   GFR calc non Af Amer >60  >60 mL/min   GFR calc Af Amer >60 >60 mL/min   Anion gap 13 5 - 15  CBC with Differential/Platelet     Status: Abnormal   Collection Time: 07/29/19  5:44 AM  Result Value Ref Range   WBC 19.9 (H) 4.0 - 10.5 K/uL   RBC 2.49 (L) 3.87 - 5.11 MIL/uL   Hemoglobin 7.4 (L) 12.0 - 15.0 g/dL   HCT 22.3 (L) 36.0 - 46.0 %   MCV 89.6 80.0 - 100.0 fL   MCH 29.7 26.0 - 34.0 pg   MCHC 33.2 30.0 - 36.0 g/dL   RDW 14.1 11.5 - 15.5 %   Platelets 438 (H) 150 - 400 K/uL   nRBC 0.2 0.0 - 0.2 %   Neutrophils Relative % 80 %   Neutro Abs 15.8 (H) 1.7 - 7.7 K/uL   Lymphocytes Relative 7 %   Lymphs Abs 1.4 0.7 - 4.0 K/uL   Monocytes Relative 6 %   Monocytes Absolute 1.2 (H) 0.1 - 1.0 K/uL   Eosinophils Relative 0 %   Eosinophils Absolute 0.1 0.0 - 0.5 K/uL   Basophils Relative 0 %   Basophils Absolute 0.1 0.0 - 0.1 K/uL  WBC Morphology MILD LEFT SHIFT (1-5% METAS, OCC MYELO, OCC BANDS)    RBC Morphology MORPHOLOGY UNREMARKABLE    Smear Review Normal platelet morphology    Immature Granulocytes 7 %   Abs Immature Granulocytes 1.31 (H) 0.00 - 0.07 K/uL   Smudge Cells PRESENT    Polychromasia PRESENT   Magnesium     Status: None   Collection Time: 07/29/19  5:44 AM  Result Value Ref Range   Magnesium 2.3 1.7 - 2.4 mg/dL  Phosphorus     Status: Abnormal   Collection Time: 07/29/19  5:44 AM  Result Value Ref Range   Phosphorus 1.7 (L) 2.5 - 4.6 mg/dL    Cultures: Results for orders placed or performed during the hospital encounter of 07/22/19  Urine Culture     Status: None   Collection Time: 07/23/19  4:00 PM   Specimen: Urine, Catheterized  Result Value Ref Range Status   Specimen Description   Final    URINE, CATHETERIZED Performed at [facility], [address] [place], [place] [address]    Special Requests   Final    NONE Performed at [facility], [address] [place], [place] [address]    Culture   Final    NO GROWTH Performed at [facility],  [place] [address], [place], [place] [address]    Report Status 07/24/2019 FINAL  Final  Aerobic/Anaerobic Culture (surgical/deep wound)     Status: None (Preliminary result)   Collection Time: 07/26/19  4:42 PM   Specimen: PATH Other; Tissue  Result Value Ref Range Status   Specimen Description   Final    FLUID ABDOMEN RLQ Performed at [facility], [address] [place], [place] [address]    Special Requests   Final    NONE Performed at [facility], [address] [place], [place] [address]    Gram Stain   Final    FEW WBC PRESENT, PREDOMINANTLY PMN NO ORGANISMS SEEN Performed at [place] [address], [place], [place] [address]    Culture   Final    NO GROWTH 2 DAYS NO ANAEROBES ISOLATED; CULTURE IN PROGRESS FOR 5 DAYS   Report Status PENDING  Incomplete  Urine Culture     Status: None   Collection Time: 07/28/19 12:03 AM   Specimen: PATH Other; Urine  Result Value Ref Range Status   Specimen Description   Final    URINE, RANDOM Performed at [facility], [address] [place], [place] [address]    Special Requests   Final    NONE Performed at [facility], [address] [place], [place] [address]    Culture   Final    NO GROWTH Performed at [facility], [place] [address], [place],  [address]    Report Status 07/29/2019 FINAL  Final    Imaging: DG Chest 1 View  Result Date: 07/28/2019 CLINICAL DATA:  Tachypnea, recent hysterectomy EXAM: CHEST  1 VIEW COMPARISON:  07/27/2019 chest radiograph. FINDINGS: Low lung volumes. Stable cardiomediastinal silhouette with normal heart size. No pneumothorax. No pleural effusion. Mild right basilar atelectasis, unchanged. No pulmonary edema. No acute consolidative airspace disease. IMPRESSION: Stable low lung volumes with mild right basilar atelectasis. Electronically Signed   By: Ilona Sorrel M.D.   On: 07/28/2019 16:45   DG Chest 1 View  Result Date:  07/27/2019 CLINICAL DATA:  Tachypnea.  Recent abdominal surgery. EXAM: CHEST  1 VIEW  COMPARISON:  11/03/2010 FINDINGS: Lung volumes are low. There is opacity at the right lung base consistent with atelectasis. Remainder of the lungs is clear. No convincing pleural effusion and no pneumothorax. Cardiac silhouette is normal in size. No mediastinal or hilar masses. Skeletal structures are grossly intact. IMPRESSION: 1. No acute cardiopulmonary disease. 2. Mild right lung base atelectasis. Electronically Signed   By: Lajean Manes M.D.   On: 07/27/2019 16:15   DG Abd 1 View  Result Date: 07/22/2019 CLINICAL DATA:  Evaluate for possible retained sponge EXAM: ABDOMEN - 1 VIEW COMPARISON:  None. FINDINGS: Scattered large and small bowel gas is noted. No abnormal mass or abnormal calcifications are seen. Postsurgical changes are noted. No radiopaque foreign body is identified to suggest retained sponge. IMPRESSION: No evidence of retained foreign body. These results will be called to the ordering clinician or representative by the Radiologist Assistant, and communication documented in the PACS or zVision Dashboard. Electronically Signed   By: Inez Catalina M.D.   On: 07/22/2019 22:49   [place] Abscess Drain  Result Date: 07/26/2019 INDICATION: History of hysterectomy complicated by small bowel injury requiring resection and primary anastomosis. CT scan of the abdomen and pelvis performed earlier today secondary to elevated white blood cell count and fever demonstrated indeterminate fluid collections within the abdomen with dominant collection within the right lower abdominal quadrant As such, request made for ultrasound-guided paracentesis versus drainage catheter placement for and diagnostic and potentially therapeutic purposes. EXAM: ULTRASOUND GUIDED ABSCESS DRAINAGE COMPARISON:  CT abdomen and pelvis - earlier same day MEDICATIONS: The patient is currently admitted to the hospital and receiving intravenous antibiotics.  The antibiotics were administered within an appropriate time frame prior to the initiation of the procedure. ANESTHESIA/SEDATION: None CONTRAST:  None COMPLICATIONS: None immediate. PROCEDURE: Informed written consent was obtained from the patient after a discussion of the risks, benefits and alternatives to treatment. The patient was placed supine on her hospital bed and sonographic evaluation was performed of the abdomen demonstrating ill-defined fluid scattered throughout the abdomen with dominant collection within right lower abdominal quadrant measuring approximately 9.6 x 8.2 x 3.9 cm, correlating with the dominant collection seen on preceding abdominal CT image 66, series 2. The procedure was planned. A timeout was performed prior to the initiation of the procedure. The skin overlying the right lower abdomen was prepped and draped in the usual sterile fashion. The overlying soft tissues were anesthetized with 1% lidocaine with epinephrine. Appropriate trajectory was planned with the use of a 22 gauge spinal needle. An 18 gauge trocar needle was advanced into the abscess/fluid collection and a short Amplatz super stiff wire was coiled within the collection. Multiple ultrasound images were saved for procedural documentation purposes Next, the track was dilated ultimately allowing placement of a 10 [place] all-purpose drainage catheter. Next, approximately 150 cc of brown serous ascitic fluid was aspirated. A representative sample was capped and sent to the laboratory for analysis. The tube was connected to a drainage bag and sutured in place. A dressing was placed. The patient tolerated the procedure well without immediate post procedural complication. IMPRESSION: Successful ultrasound guided placement of a 10 French all purpose drain catheter into the dominant collection within the right lower abdomen with aspiration of 150 cc of brown, serous ascitic fluid. Samples were sent to the laboratory as requested by  the ordering clinical team. Electronically Signed   By: Sandi Mariscal M.D.   On: 07/26/2019 17:30   CT ABDOMEN PELVIS W CONTRAST  Result  Date: 07/26/2019 CLINICAL DATA:  Abdominal pain and tenderness. Leukocytosis. Recent small bowel resection and hysterectomy. EXAM: CT ABDOMEN AND PELVIS WITH CONTRAST TECHNIQUE: Multidetector CT imaging of the abdomen and pelvis was performed using the standard protocol following bolus administration of intravenous contrast. CONTRAST:  166mL OMNIPAQUE IOHEXOL 300 MG/ML  SOLN COMPARISON:  06/21/2019 FINDINGS: Lower Chest: New bibasilar atelectasis. Hepatobiliary: No hepatic masses identified. Prior cholecystectomy. No evidence of biliary obstruction. Pancreas:  No mass or inflammatory changes. Spleen: Within normal limits in size and appearance. Adrenals/Urinary Tract: Stable tiny bilateral renal cysts. No masses identified. No evidence of ureteral calculi or hydronephrosis. Stomach/Bowel: A small amount of extraperitoneal air in the lower pelvis, and intraperitoneal air, are consistent with recent surgery. No evidence of bowel obstruction. Mildly dilated small bowel loops are seen containing air-fluid levels and increased gaseous distention of colon is also seen, suspicious for adynamic ileus. Diffuse mesenteric inflammatory changes or edema are new since previous study. Multiple small loculated fluid intraperitoneal fluid collections are seen in the abdomen and pelvis, 1 in the right lower quadrant measuring 9.8 x 5.1 cm on image 66/2. Vascular/Lymphatic: No pathologically enlarged lymph nodes. No abdominal aortic aneurysm. Reproductive: Prior hysterectomy noted. Rim enhancing fluid collection seen in the hysterectomy bed which measures 8.0 x 7.4 cm on image 67/7. Other:  None. Musculoskeletal:  No suspicious bone lesions identified. IMPRESSION: 1. Small amount of intraperitoneal and extraperitoneal air, consistent with recent surgery. 2. Multiple loculated intraperitoneal fluid  collections in the abdomen and pelvis, largest in the right lower quadrant measuring 9.8 cm and pelvic cul-de-sac measuring 8.0 cm, suspicious for abscesses. 3. Mild diffuse dilatation of small bowel and colon, suspicious for adynamic ileus. 4. New bibasilar atelectasis. Electronically Signed   By: Marlaine Hind M.D.   On: 07/26/2019 13:38   US Venous Img Lower Bilateral (DVT)  Result Date: 07/27/2019 CLINICAL DATA:  Tachycardia.  History of recent surgery. EXAM: BILATERAL LOWER EXTREMITY VENOUS DOPPLER ULTRASOUND TECHNIQUE: Gray-scale sonography with compression, as well as color and duplex ultrasound, were performed to evaluate the deep venous system(s) from the level of the common femoral vein through the popliteal and proximal calf veins. COMPARISON:  None. FINDINGS: VENOUS Normal compressibility of the common femoral, superficial femoral, and popliteal veins, as well as the visualized calf veins. Visualized portions of profunda femoral vein and great saphenous vein unremarkable. No filling defects to suggest DVT on grayscale or color Doppler imaging. Doppler waveforms show normal direction of venous flow, normal respiratory phasicity and response to augmentation. Limited views of the contralateral common femoral vein are unremarkable. OTHER Edema noted in the legs bilaterally. Limitations: none IMPRESSION: No femoropopliteal DVT nor evidence of DVT within the visualized calf veins. If clinical symptoms are inconsistent or if there are persistent or worsening symptoms, further imaging (possibly involving the iliac veins) may be warranted. Electronically Signed   By: Lajean Manes M.D.   On: 07/27/2019 16:16     Assessment   44 y.o.. Hospital Day: 8 Postoperative day 3 from a laparoscopic converted to open total hysterectomy, left salpingectomy, cystoscopy, and small bowel resection  Plan   1. FEN: on IV fluids, NPO  2. Pain control:  replace narcotics with morphine to decrease side effects.  3.  Leukocytosis: continue to trend.  Continue zosyn  4. Postop: keep foley in place, IV fluids, and NPO for now.  Surgery will guide advances.   5. Acute blood loss anemia:  s/p 1uPRBC, will trend Hb after 2nd surgery  6.  Bowel function:  will likely be sluggish. NG tube attempted to be placed after surgery but curled in her mouth rather than down her esophagus.    7. Mental health:  Continue lexapro daily, she may benefit from visit with chaplain or other community support person   8. DVT prophylaxis:  SCDs and Lovenox BID   Continue inpatient admission  Will ask surgery to take over as primary, as her acute and long-term issues are now strictly bowel.  For safety reasons, it serves as no benefit to have me as the middle-man.    ----- Larey Days, MD, [place] Attending Obstetrician and Gynecologist [facility], Department of [facility] Page:  [phone]    42 minutes total time spent on face to face, reviewing labs other notes, etc., and writing this note.

## 2019-07-29 NOTE — Progress Notes (Signed)
Foley has output of 839mL over the last 24 hours. Urine is straw yellow with some sediment in it. Dr. Lysle Pearl said to leave foley in due to the sediment and continue to monitor.

## 2019-07-29 NOTE — Progress Notes (Signed)
Pharmacy Electrolyte Monitoring Consult:  Pharmacy consulted to assist in monitoring and replacing electrolytes in this 44 y.o. female admitted on 07/22/2019 with No chief complaint on file.   Labs:  Sodium (mmol/L)  Date Value  07/29/2019 135   Potassium (mmol/L)  Date Value  07/29/2019 2.7 (LL)   Magnesium (mg/dL)  Date Value  07/29/2019 2.3   Phosphorus (mg/dL)  Date Value  07/29/2019 1.7 (L)   Calcium (mg/dL)  Date Value  07/29/2019 7.0 (L)   Albumin (g/dL)  Date Value  07/28/2019 2.0 (L)    Assessment: 44 yo F s/p exp. Lap and repair of small bowel enterotomy as well as hysterectomy with OB/GYN and surgery involved. Reexploration abdominal washout and resection of original anastomosis today 3/14.  Goal for Electrolytes:  K ~4.0,  Mag ~ 2.0, Phos > 2.5  Plan: 3/14  K 2.7  Mag 2.3  Phos 1.7  Scr 0.53  -Will order Potassium Phosphate 30 mmol IV x 1 dose ( has ~44 meq of K)  - will order IV KCl 69meq x 4   - Will recheck K @ 1800 per protocol  -will f/u electrolyte labs in am and replace as needed   Lu Duffel, PharmD, BCPS Clinical Pharmacist 07/29/2019 8:15 AM

## 2019-07-29 NOTE — Progress Notes (Signed)
Pharmacy Electrolyte Monitoring Consult:  Pharmacy consulted to assist in monitoring and replacing electrolytes in this 44 y.o. female admitted on 07/22/2019 with No chief complaint on file.   Labs:  Sodium (mmol/L)  Date Value  07/29/2019 135   Potassium (mmol/L)  Date Value  07/29/2019 2.9 (L)   Magnesium (mg/dL)  Date Value  07/29/2019 2.3   Phosphorus (mg/dL)  Date Value  07/29/2019 1.7 (L)   Calcium (mg/dL)  Date Value  07/29/2019 7.0 (L)   Albumin (g/dL)  Date Value  07/28/2019 2.0 (L)    Assessment: 44 yo F s/p exp. Lap and repair of small bowel enterotomy as well as hysterectomy with OB/GYN and surgery involved. Reexploration abdominal washout and resection of original anastomosis today 3/14.  Goal for Electrolytes:  K ~4.0,  Mag ~ 2.0, Phos > 2.5  Plan: 3/14  K 2.7  Mag 2.3  Phos 1.7  Scr 0.53  -Will order Potassium Phosphate 30 mmol IV x 1 dose ( has ~44 meq of K)  - will order IV KCl 29meq x 4   - Will recheck K @ 1800 per protocol  3/15@2049 : K 2.9  - will order IV KCl 30meq x 4   -will f/u electrolyte labs in am and replace as needed   Pearla Dubonnet, PharmD Clinical Pharmacist 07/29/2019 9:50 PM

## 2019-07-30 ENCOUNTER — Inpatient Hospital Stay: Payer: Self-pay

## 2019-07-30 ENCOUNTER — Inpatient Hospital Stay: Payer: Managed Care, Other (non HMO)

## 2019-07-30 LAB — CBC WITH DIFFERENTIAL/PLATELET
Abs Immature Granulocytes: 1.41 10*3/uL — ABNORMAL HIGH (ref 0.00–0.07)
Basophils Absolute: 0.1 10*3/uL (ref 0.0–0.1)
Basophils Relative: 0 %
Eosinophils Absolute: 0.3 10*3/uL (ref 0.0–0.5)
Eosinophils Relative: 2 %
HCT: 21.4 % — ABNORMAL LOW (ref 36.0–46.0)
Hemoglobin: 6.9 g/dL — ABNORMAL LOW (ref 12.0–15.0)
Immature Granulocytes: 7 %
Lymphocytes Relative: 9 %
Lymphs Abs: 1.9 10*3/uL (ref 0.7–4.0)
MCH: 29.6 pg (ref 26.0–34.0)
MCHC: 32.2 g/dL (ref 30.0–36.0)
MCV: 91.8 fL (ref 80.0–100.0)
Monocytes Absolute: 1 10*3/uL (ref 0.1–1.0)
Monocytes Relative: 5 %
Neutro Abs: 15.7 10*3/uL — ABNORMAL HIGH (ref 1.7–7.7)
Neutrophils Relative %: 77 %
Platelets: 452 10*3/uL — ABNORMAL HIGH (ref 150–400)
RBC: 2.33 MIL/uL — ABNORMAL LOW (ref 3.87–5.11)
RDW: 14.1 % (ref 11.5–15.5)
Smear Review: NORMAL
WBC: 20.4 10*3/uL — ABNORMAL HIGH (ref 4.0–10.5)
nRBC: 0.1 % (ref 0.0–0.2)

## 2019-07-30 LAB — PHOSPHORUS
Phosphorus: 1.8 mg/dL — ABNORMAL LOW (ref 2.5–4.6)
Phosphorus: 2.5 mg/dL (ref 2.5–4.6)

## 2019-07-30 LAB — POTASSIUM: Potassium: 3.1 mmol/L — ABNORMAL LOW (ref 3.5–5.1)

## 2019-07-30 LAB — BASIC METABOLIC PANEL
Anion gap: 9 (ref 5–15)
BUN: <5 mg/dL — ABNORMAL LOW (ref 6–20)
CO2: 27 mmol/L (ref 22–32)
Calcium: 7 mg/dL — ABNORMAL LOW (ref 8.9–10.3)
Chloride: 101 mmol/L (ref 98–111)
Creatinine, Ser: 0.44 mg/dL (ref 0.44–1.00)
GFR calc Af Amer: >60 mL/min (ref >60)
GFR calc non Af Amer: >60 mL/min (ref >60)
Glucose, Bld: 88 mg/dL (ref 70–99)
Potassium: 3.1 mmol/L — ABNORMAL LOW (ref 3.5–5.1)
Sodium: 137 mmol/L (ref 135–145)

## 2019-07-30 LAB — SURGICAL PATHOLOGY

## 2019-07-30 LAB — MAGNESIUM: Magnesium: 2.1 mg/dL (ref 1.7–2.4)

## 2019-07-30 MED ORDER — TRACE MINERALS CU-MN-SE-ZN 300-55-60-3000 MCG/ML IV SOLN
INTRAVENOUS | Status: AC
  Filled 2019-07-30: qty 960

## 2019-07-30 MED ORDER — SODIUM CHLORIDE 0.9% FLUSH
10.0000 mL | INTRAVENOUS | Status: DC | PRN
  Administered 2019-07-31 – 2019-08-07 (×2): 10 mL

## 2019-07-30 MED ORDER — INSULIN ASPART 100 UNIT/ML ~~LOC~~ SOLN
0.0000 [IU] | Freq: Four times a day (QID) | SUBCUTANEOUS | Status: DC
  Administered 2019-07-31 – 2019-08-03 (×6): 1 [IU] via SUBCUTANEOUS
  Administered 2019-08-04: 06:00:00 2 [IU] via SUBCUTANEOUS
  Administered 2019-08-04 – 2019-08-05 (×5): 1 [IU] via SUBCUTANEOUS
  Administered 2019-08-05 – 2019-08-06 (×3): 2 [IU] via SUBCUTANEOUS
  Administered 2019-08-06: 1 [IU] via SUBCUTANEOUS
  Administered 2019-08-06: 2 [IU] via SUBCUTANEOUS
  Administered 2019-08-07 – 2019-08-09 (×6): 1 [IU] via SUBCUTANEOUS
  Administered 2019-08-10: 2 [IU] via SUBCUTANEOUS
  Administered 2019-08-11 – 2019-08-18 (×13): 1 [IU] via SUBCUTANEOUS
  Filled 2019-07-30 (×38): qty 1

## 2019-07-30 MED ORDER — MORPHINE SULFATE (PF) 2 MG/ML IV SOLN
1.0000 mg | INTRAVENOUS | Status: DC | PRN
  Administered 2019-08-05: 1 mg via INTRAVENOUS
  Filled 2019-07-30: qty 1

## 2019-07-30 MED ORDER — POTASSIUM PHOSPHATES 15 MMOLE/5ML IV SOLN
45.0000 mmol | Freq: Once | INTRAVENOUS | Status: AC
  Administered 2019-07-30: 45 mmol via INTRAVENOUS
  Filled 2019-07-30: qty 15

## 2019-07-30 MED ORDER — FAT EMULSION PLANT BASED 20 % IV EMUL
360.0000 mL | INTRAVENOUS | Status: AC
  Administered 2019-07-30: 360 mL via INTRAVENOUS
  Filled 2019-07-30: qty 250

## 2019-07-30 MED ORDER — CHLORHEXIDINE GLUCONATE CLOTH 2 % EX PADS
6.0000 | MEDICATED_PAD | Freq: Every day | CUTANEOUS | Status: DC
  Administered 2019-07-31: 08:00:00 6 via TOPICAL

## 2019-07-30 MED ORDER — HYDROCODONE-ACETAMINOPHEN 5-325 MG PO TABS: 1.0000 | ORAL_TABLET | Freq: Four times a day (QID) | ORAL | Status: DC | PRN

## 2019-07-30 MED ORDER — ACETAMINOPHEN 325 MG PO TABS
650.0000 mg | ORAL_TABLET | Freq: Four times a day (QID) | ORAL | Status: DC | PRN
  Administered 2019-08-03: 650 mg via ORAL
  Filled 2019-07-30: qty 2

## 2019-07-30 MED ORDER — POTASSIUM CHLORIDE 10 MEQ/100ML IV SOLN
10.0000 meq | INTRAVENOUS | Status: AC
  Administered 2019-07-30 – 2019-07-31 (×4): 10 meq via INTRAVENOUS
  Filled 2019-07-30 (×3): qty 100

## 2019-07-30 MED ORDER — CHLORHEXIDINE GLUCONATE CLOTH 2 % EX PADS
6.0000 | MEDICATED_PAD | Freq: Every day | CUTANEOUS | Status: DC
  Administered 2019-07-30 – 2019-08-04 (×5): 6 via TOPICAL

## 2019-07-30 NOTE — Progress Notes (Signed)
Nutrition Follow Up Note   DOCUMENTATION CODES:   Morbid obesity  INTERVENTION:    Initiate Clinimix 5/20 with electrolytes at 81ml/hr (Goal rate 9ml/hr once labs stable)  20% lipids @30ml /hr x 12 hrs/day    Regimen at goal rate will provide 2472kcal/day, 100g/day protein, 233ml volume    Add MVI and trace elements per shortage    Add IV thiamine 100mg  daily x 3 days   Pt at high refeeding risk; recommend check P, K, and Mg daily for 3 days after TPN iniatition.    Daily weights   Discontinue IVF per MD  NUTRITION DIAGNOSIS:   Inadequate oral intake related to acute illness as evidenced by NPO status.  GOAL:   Patient will meet greater than or equal to 90% of their needs -progressing with TPN initiation   MONITOR:   Diet advancement, Labs, Weight trends, Skin, I & O's, TPN  ASSESSMENT:   44 y/o female with uterine fibroids s/p laparoscopic converted to open total hysterectomy, left salpingectomy, cystoscopy and 25 cm small bowel resection (near distal ileum) secondary to bowel injury 3/8. Pt declined post operatively requiring exploratory laparotomy, lysis of adhesions, abdominal wound washout, resection of previous anastomosis with ~20cm of small bowel removed and subsequent reanastomosis 3/14   Pt remains NPO today. Plan is for PICC line and TPN. Pt is at high refeed risk.   Medications reviewed and include: lovenox, lasix, synthroid, protonix, NaCl @75ml /hr, zosyn, K Phos  Labs reviewed: K 3.1(L), BUN <5(L), P 1.8(L), Mg 2.1 wnl Wbc- 20.4(H), Hgb 6.9(L), Hct 21.4(L)  Diet Order:   Diet Order            Diet NPO time specified Except for: Ice Chips, Sips with Meds, Other (See Comments)  Diet effective now             EDUCATION NEEDS:   No education needs have been identified at this time  Skin:  Skin Assessment: Reviewed RN Assessment(incision abdomen)  Last BM:  3/13- type 7  Height:   Ht Readings from Last 1 Encounters:  07/23/19 5\' 2"  (1.575  m)    Weight:   Wt Readings from Last 1 Encounters:  07/23/19 133.3 kg    Ideal Body Weight:  50 kg  BMI:  Body mass index is 53.75 kg/m.  Estimated Nutritional Needs:   Kcal:  2400-2700kcal/day  Protein:  120-135g/day  Fluid:  1.8L/day  [name] MS, RD, LDN Contact information available in Amion

## 2019-07-30 NOTE — Progress Notes (Signed)
Pharmacy Electrolyte Monitoring Consult:  Pharmacy consulted to assist in monitoring and replacing electrolytes in this 44 y.o. female admitted on 07/22/2019 with No chief complaint on file.   Labs:  Sodium (mmol/L)  Date Value  07/30/2019 137   Potassium (mmol/L)  Date Value  07/30/2019 3.1 (L)   Magnesium (mg/dL)  Date Value  07/30/2019 2.1   Phosphorus (mg/dL)  Date Value  07/30/2019 2.5   Calcium (mg/dL)  Date Value  07/30/2019 7.0 (L)   Albumin (g/dL)  Date Value  07/28/2019 2.0 (L)    Assessment: 44 yo F s/p exp. Lap and repair of small bowel enterotomy as well as hysterectomy with OB/GYN and surgery involved. Reexploration abdominal washout and resection of original anastomosis today 3/14.  Goal for Electrolytes:  K ~4.0,  Mag ~ 2.0, Phos > 2.5  Plan:   K 3.1  Mag 2.1  Phos 2.5    Scr 0.44 Patient on Furosemide 20 mg IV bid  Phos at goal - no replenishment warranted  Will replete KCl 36meq IV x 6  -will f/u electrolyte labs in am and replace as needed   Lu Duffel, PharmD Clinical Pharmacist 07/30/2019 7:00 PM

## 2019-07-30 NOTE — Progress Notes (Signed)
Peripherally Inserted Central Catheter/Midline Placement  The IV Nurse has discussed with the patient and/or persons authorized to consent for the patient, the purpose of this procedure and the potential benefits and risks involved with this procedure.  The benefits include less needle sticks, lab draws from the catheter, and the patient may be discharged home with the catheter. Risks include, but not limited to, infection, bleeding, blood clot (thrombus formation), and puncture of an artery; nerve damage and irregular heartbeat and possibility to perform a PICC exchange if needed/ordered by physician.  Alternatives to this procedure were also discussed.  Bard Power PICC patient education guide, fact sheet on infection prevention and patient information card has been provided to patient /or left at bedside.    PICC/Midline Placement Documentation  PICC Double Lumen [id number] PICC Right Basilic 40 cm 0 cm (Active)  Indication for Insertion or Continuance of Line Administration of hyperosmolar/irritating solutions (i.e. TPN, Vancomycin, etc.) 07/30/19 1634  Exposed Catheter (cm) 0 cm 07/30/19 1634  Site Assessment Clean;Dry;Intact 07/30/19 1634  Lumen #1 Status Flushed;Blood return noted;Saline locked 07/30/19 1634  Lumen #2 Status Flushed;Blood return noted;Saline locked 07/30/19 1634  Dressing Type Transparent 07/30/19 1634  Dressing Status Clean;Dry;Intact;Antimicrobial disc in place 07/30/19 1634  Dressing Change Due 08/06/19 07/30/19 1634       Scotty Court 07/30/2019, 4:36 PM

## 2019-07-30 NOTE — Progress Notes (Signed)
Pt would not roll completely, complained she was tired. When changing sheets, bottom sheet stained. Pt would not roll again to change.

## 2019-07-30 NOTE — Progress Notes (Addendum)
Subjective:  CC: Karen Aguirre is a 44 y.o. female  Hospital stay day 8,  exploratory laparotomy and repair of small bowel enterotomy as well as hysterectomy with OB/GYN  3 Days Post-Op  Reexploration abdominal washout and resection of original anastomosis   HPI: No acute events postop.  Patient able to get some sleep and the pain continues to improve.  Still having bouts of nausea and emesis.  No reported flatus or BM.  ROS:  General: Denies weight loss, weight gain, fatigue, fevers, chills, and night sweats. Heart: Denies chest pain, palpitations, racing heart, irregular heartbeat, leg pain or swelling, and decreased activity tolerance. Respiratory: Denies breathing difficulty, shortness of breath, wheezing, cough, and sputum. GI: Denies change in appetite, heartburn, constipation, diarrhea, and blood in stool. GU: Denies difficulty urinating, pain with urinating, urgency, frequency, blood in urine.   Objective:   Temp:  [99 F (37.2 C)-99.5 F (37.5 C)] 99 F (37.2 C) (03/16 1200) Pulse Rate:  [95-114] 95 (03/16 1200) Resp:  [20] 20 (03/16 1200) BP: (123-137)/(66-87) 137/66 (03/16 1200) SpO2:  [96 %-99 %] 99 % (03/16 1200)     Height: 5\' 2"  (157.5 cm) Weight: 133.3 kg BMI (Calculated): 53.74   Intake/Output this shift:   Intake/Output Summary (Last 24 hours) at 07/30/2019 1521 Last data filed at 07/30/2019 1203 Gross per 24 hour  Intake 1935.29 ml  Output 2410 ml  Net -474.71 ml  200 mL of bilious output from IR drain  Constitutional :  alert, cooperative, appears stated age and mild distress  Respiratory:  clear to auscultation bilaterally  Cardiovascular:  regular rate and rhythm  Gastrointestinal: Soft, decreased guarding and tenderness to palpation in left lower quadrant and suprapubic area.  Midline incision with mild purulent discharge from 2cm opening in midline incision now near umbilicus, nonindurated, no erythema, and some bloody discharge on dressing.  Port  sites remain clean dry and intact.  IR drain now serous.   Skin: Cool and moist.   Psychiatric: Normal affect, non-agitated, not confused       LABS:  CMP Latest Ref Rng & Units 07/30/2019 07/29/2019 07/29/2019  Glucose 70 - 99 mg/dL 88 - 116(H)  BUN 6 - 20 mg/dL <5(L) - <5(L)  Creatinine 0.44 - 1.00 mg/dL 0.44 - 0.48  Sodium 135 - 145 mmol/L 137 - 135  Potassium 3.5 - 5.1 mmol/L 3.1(L) 2.9(L) 2.7(LL)  Chloride 98 - 111 mmol/L 101 - 97(L)  CO2 22 - 32 mmol/L 27 - 25  Calcium 8.9 - 10.3 mg/dL 7.0(L) - 7.0(L)  Total Protein 6.5 - 8.1 g/dL - - -  Total Bilirubin 0.3 - 1.2 mg/dL - - -  Alkaline Phos 38 - 126 U/L - - -  AST 15 - 41 U/L - - -  ALT 0 - 44 U/L - - -   CBC Latest Ref Rng & Units 07/30/2019 07/29/2019 07/28/2019  WBC 4.0 - 10.5 K/uL 20.4(H) 19.9(H) -  Hemoglobin 12.0 - 15.0 g/dL 6.9(L) 7.4(L) 7.3(L)  Hematocrit 36.0 - 46.0 % 21.4(L) 22.3(L) 22.6(L)  Platelets 150 - 400 K/uL 452(H) 438(H) -    RADS: N/a  Assessment:   exploratory laparotomy and repair of small bowel enterotomy as well as hysterectomy with OB/GYN  Reexploration abdominal washout and resection of original anastomosis.  Slightly more somnolent, but easily arousable and appropriate, clinical exam continues to improve today.  HR WNL on latest check.  Leukocytosis persistent with no obvious source, drop in hemoglobin as well.  Will repeat  in am.  Hold lovenox until then. Will obtain blood cultures since they were never drawn just in case wbc is from bacteremia.  U/o adequate but sediments still noted with ouput.  Keep foley until sediments clear.  Cultures NGTD.    Pending PICC placement for TPN, expect prolonged ileus.  Electrolyte per pharmacy

## 2019-07-30 NOTE — Anesthesia Postprocedure Evaluation (Signed)
Anesthesia Post Note  Patient: Karen Aguirre  Procedure(s) Performed: EXAM UNDER ANESTHESIA (N/A ) EXPLORATORY LAPAROTOMY (N/A ) SMALL BOWEL RESECTION  Patient location during evaluation: PACU Anesthesia Type: General Level of consciousness: awake and alert Pain management: pain level controlled Vital Signs Assessment: post-procedure vital signs reviewed and stable Respiratory status: spontaneous breathing, nonlabored ventilation and respiratory function stable Cardiovascular status: blood pressure returned to baseline and stable Postop Assessment: no apparent nausea or vomiting Anesthetic complications: no     Last Vitals:  Vitals:   07/30/19 0443 07/30/19 1200  BP: 135/87 137/66  Pulse: (!) 102 95  Resp: 20 20  Temp: 37.3 C 37.2 C  SpO2: 96% 99%    Last Pain:  Vitals:   07/30/19 1400  TempSrc:   PainSc: [place]

## 2019-07-30 NOTE — Progress Notes (Signed)
Pharmacy Electrolyte Monitoring Consult:  Pharmacy consulted to assist in monitoring and replacing electrolytes in this 44 y.o. female admitted on 07/22/2019 with No chief complaint on file.   Labs:  Sodium (mmol/L)  Date Value  07/30/2019 137   Potassium (mmol/L)  Date Value  07/30/2019 3.1 (L)   Magnesium (mg/dL)  Date Value  07/30/2019 2.1   Phosphorus (mg/dL)  Date Value  07/30/2019 1.8 (L)   Calcium (mg/dL)  Date Value  07/30/2019 7.0 (L)   Albumin (g/dL)  Date Value  07/28/2019 2.0 (L)    Assessment: 44 yo F s/p exp. Lap and repair of small bowel enterotomy as well as hysterectomy with OB/GYN and surgery involved. Reexploration abdominal washout and resection of original anastomosis today 3/14.  Goal for Electrolytes:  K ~4.0,  Mag ~ 2.0, Phos > 2.5  Plan:   K 3.1  Mag 2.1  Phos 1.8  Scr 0.44 Patient on Furosemide 20 mg IV bid  -Will order Potassium Phosphate 45 mmol IV x 1 dose ( has ~66 meq of K). Phos has been slow to respond to supplementation.  -Will check Potassium and Phos level at 1800 to evaluate response to supplementation  -will f/u electrolyte labs in am and replace as needed   Noralee Space, PharmD Clinical Pharmacist 07/30/2019 7:31 AM

## 2019-07-30 NOTE — Progress Notes (Signed)
PHARMACY - TOTAL PARENTERAL NUTRITION CONSULT NOTE   Indication:NPO x 7 days.   s/p laparoscopic converted to open total hysterectomy, left salpingectomy, cystoscopy and 25 cm small bowel resection (near distal ileum) secondary to bowel injury 3/8. Pt declined post operatively requiring another exploratory laparotomy, lysis of adhesions, abdominal wound washout, resection of previous anastomosis with ~20cm of small bowel removed and subsequent reanastomosis 3/14   Patient Measurements: Height: 5\' 2"  (157.5 cm) Weight: 293 lb 14 oz (133.3 kg) IBW/kg (Calculated) : 50.1 TPN AdjBW (KG): 70.9 Body mass index is 53.75 kg/m. Usual Weight:    Assessment:   Glucose / Insulin: start SSI q6h Electrolytes: K 3.1  Mag 2.1  Phos 1.8  (see electrolyte consult today 3/16) Renal: Scr 0.44 LFTs / TGs:  Prealbumin / albumin:  Intake / Output; MIVF:  GI Imaging: Surgeries / Procedures:   Central access: to get PICC 3/16? TPN start date: 07/30/19  If gets PICC  Nutritional Goals (per RD recommendation on  ): Kcal:  2400-2700kcal/day  Goal TPN rate is 83 mL/hr (Regimen at goal rate will provide 2472kcal/day, 100g/day protein, 2397ml volume ) Protein:  120-135g/day  Fluid:   Current Nutrition:  NPO  Plan:  Initiate Clinimix 5/20 with electrolytes at 1ml/hr (Goal rate 36ml/hr once labs stable) at 1800.  -Add MVI to 1st bag then on Mondays and Thursdays, trace elements daily -add Thiamine x 3 days (3/16-3/18)  20% lipids @30ml /hr x 12 hrs/day   Initiate Sensitive q6h SSI and adjust as needed  Discontinue MIVF when TPN starts at 1800  Pt at high refeeding risk; recommend check P, K, and Mg daily for 3 days after TPN iniatition.  Monitor TPN labs for 1st 3 days and then on Mon/Thurs, per rotocol  Sky Borboa A 07/30/2019,3:31 PM

## 2019-07-31 ENCOUNTER — Encounter: Payer: Self-pay | Admitting: Obstetrics & Gynecology

## 2019-07-31 ENCOUNTER — Inpatient Hospital Stay: Payer: Managed Care, Other (non HMO)

## 2019-07-31 LAB — CBC WITH DIFFERENTIAL/PLATELET
Abs Immature Granulocytes: 1.92 10*3/uL — ABNORMAL HIGH (ref 0.00–0.07)
Basophils Absolute: 0.1 10*3/uL (ref 0.0–0.1)
Basophils Relative: 0 %
Eosinophils Absolute: 0.4 10*3/uL (ref 0.0–0.5)
Eosinophils Relative: 2 %
HCT: 21.4 % — ABNORMAL LOW (ref 36.0–46.0)
Hemoglobin: 7 g/dL — ABNORMAL LOW (ref 12.0–15.0)
Immature Granulocytes: 8 %
Lymphocytes Relative: 8 %
Lymphs Abs: 1.9 10*3/uL (ref 0.7–4.0)
MCH: 30 pg (ref 26.0–34.0)
MCHC: 32.7 g/dL (ref 30.0–36.0)
MCV: 91.8 fL (ref 80.0–100.0)
Monocytes Absolute: 0.7 10*3/uL (ref 0.1–1.0)
Monocytes Relative: 3 %
Neutro Abs: 18.7 10*3/uL — ABNORMAL HIGH (ref 1.7–7.7)
Neutrophils Relative %: 79 %
Platelets: 530 10*3/uL — ABNORMAL HIGH (ref 150–400)
RBC: 2.33 MIL/uL — ABNORMAL LOW (ref 3.87–5.11)
RDW: 14.2 % (ref 11.5–15.5)
Smear Review: NORMAL
WBC: 23.7 10*3/uL — ABNORMAL HIGH (ref 4.0–10.5)
nRBC: 0.4 % — ABNORMAL HIGH (ref 0.0–0.2)

## 2019-07-31 LAB — BASIC METABOLIC PANEL
Anion gap: 8 (ref 5–15)
BUN: <5 mg/dL — ABNORMAL LOW (ref 6–20)
CO2: 26 mmol/L (ref 22–32)
Calcium: 7.1 mg/dL — ABNORMAL LOW (ref 8.9–10.3)
Chloride: 101 mmol/L (ref 98–111)
Creatinine, Ser: 0.4 mg/dL — ABNORMAL LOW (ref 0.44–1.00)
GFR calc Af Amer: >60 mL/min (ref >60)
GFR calc non Af Amer: >60 mL/min (ref >60)
Glucose, Bld: 138 mg/dL — ABNORMAL HIGH (ref 70–99)
Potassium: 2.9 mmol/L — ABNORMAL LOW (ref 3.5–5.1)
Sodium: 135 mmol/L (ref 135–145)

## 2019-07-31 LAB — C DIFFICILE QUICK SCREEN W PCR REFLEX
C Diff antigen: NEGATIVE
C Diff interpretation: NOT DETECTED
C Diff toxin: NEGATIVE

## 2019-07-31 LAB — GLUCOSE, CAPILLARY
Glucose-Capillary: 113 mg/dL — ABNORMAL HIGH (ref 70–99)
Glucose-Capillary: 121 mg/dL — ABNORMAL HIGH (ref 70–99)
Glucose-Capillary: 123 mg/dL — ABNORMAL HIGH (ref 70–99)
Glucose-Capillary: 139 mg/dL — ABNORMAL HIGH (ref 70–99)
Glucose-Capillary: 99 mg/dL (ref 70–99)

## 2019-07-31 LAB — PREALBUMIN: Prealbumin: 5.6 mg/dL — ABNORMAL LOW (ref 18–38)

## 2019-07-31 LAB — PHOSPHORUS: Phosphorus: 1.8 mg/dL — ABNORMAL LOW (ref 2.5–4.6)

## 2019-07-31 LAB — TRIGLYCERIDES: Triglycerides: 198 mg/dL — ABNORMAL HIGH (ref <150)

## 2019-07-31 LAB — MAGNESIUM: Magnesium: 2.1 mg/dL (ref 1.7–2.4)

## 2019-07-31 MED ORDER — TRACE MINERALS CU-MN-SE-ZN 300-55-60-3000 MCG/ML IV SOLN
INTRAVENOUS | Status: AC
  Filled 2019-07-31: qty 960

## 2019-07-31 MED ORDER — POTASSIUM PHOSPHATES 15 MMOLE/5ML IV SOLN
45.0000 mmol | Freq: Once | INTRAVENOUS | Status: DC
  Filled 2019-07-31: qty 15

## 2019-07-31 MED ORDER — FAT EMULSION PLANT BASED 20 % IV EMUL
360.0000 mL | INTRAVENOUS | Status: AC
  Administered 2019-07-31: 360 mL via INTRAVENOUS
  Filled 2019-07-31: qty 360

## 2019-07-31 MED ORDER — SODIUM CHLORIDE 0.9 % IV SOLN
Freq: Once | INTRAVENOUS | Status: AC
  Filled 2019-07-31: qty 15

## 2019-07-31 MED ORDER — IOHEXOL 300 MG/ML  SOLN
125.0000 mL | Freq: Once | INTRAMUSCULAR | Status: AC | PRN
  Administered 2019-07-31: 10:00:00 125 mL via INTRAVENOUS

## 2019-07-31 MED ORDER — IOHEXOL 9 MG/ML PO SOLN: 500.0000 mL | ORAL | Status: AC

## 2019-07-31 NOTE — Progress Notes (Signed)
Transferred attending duties/ status to Dr. Lysle Pearl.   Discussed with patient/family.

## 2019-07-31 NOTE — Consult Note (Signed)
Infectious Disease     Reason for Consult: Leukocytosis   Referring Physician: Dr Lysle Pearl Date of Admission:  07/22/2019   Active Problems:   Fibroid uterus   Pelvic pain   S/P laparotomy   HPI: Karen Aguirre is a 44 y.o. female with a complicated history initially admitted 3/8 for lap Hysterectomy. Complicated by perforated ileum requiring SB resection and anastomosis. Then had repeat anastomotic leak requiring ex lap, LOA on 3/14. She has been zosyn since 3/12.  Wbc has been increasing despite abx but no fevers. Has been having diarrhea for last 24 hours but C diff neg. Reynolds pending, ucx neg 3/13. CT showed RLQ fluid collection improvement with drainage cath in place. Has a midline incision postsurgical with some ss drainage but no pain and purulence. Has PICC line with no pain and is on TPN.  Foley removed to day.    Past Medical History:  Diagnosis Date  . Anxiety   . Hypothyroidism   . Thyroid disease    Past Surgical History:  Procedure Laterality Date  . BOWEL RESECTION N/A 07/22/2019   Procedure: SMALL BOWEL RESECTION;  Surgeon: [place], Honor Loh, MD;  Location: [facility];  Service: Gynecology;  Laterality: N/A;  . BOWEL RESECTION  07/27/2019   Procedure: SMALL BOWEL RESECTION;  Surgeon: [place], Honor Loh, MD;  Location: [facility];  Service: Gynecology;;  . CHOLECYSTECTOMY    . ECTOPIC PREGNANCY SURGERY    . HYSTERECTOMY ABDOMINAL WITH SALPINGECTOMY Bilateral 07/22/2019   Procedure: HYSTERECTOMY ABDOMINAL WITH lbilateral SALPINGECTOMY, removal of left anterior cul de sac nodule, cystoscopy;  Surgeon: [place], Honor Loh, MD;  Location: [facility];  Service: Gynecology;  Laterality: Bilateral;  . LAPAROTOMY N/A 07/22/2019   Procedure: LAPAROTOMY;  Surgeon: [place], Honor Loh, MD;  Location: [facility];  Service: Gynecology;  Laterality: N/A;  . LAPAROTOMY N/A 07/27/2019   Procedure: EXPLORATORY LAPAROTOMY;  Surgeon: [place], Honor Loh, MD;  Location: [facility];  Service: Gynecology;  Laterality: N/A;  .  ROBOTIC ASSISTED TOTAL HYSTERECTOMY WITH BILATERAL SALPINGO OOPHERECTOMY Bilateral 07/22/2019   Procedure: XI ROBOTIC ASSISTED TOTAL HYSTERECTOMY WITH BILATERAL SALPINGECTOMY _ATTEMPTED;  Surgeon: [place], Honor Loh, MD;  Location: [facility];  Service: Gynecology;  Laterality: Bilateral;   Social History   Tobacco Use  . Smoking status: Former Research scientist (life sciences)  . Smokeless tobacco: Never Used  . Tobacco comment: 20 years ago  Substance Use Topics  . Alcohol use: Yes    Comment: ocassional  . Drug use: No   History reviewed. No pertinent family history.  Allergies:  Allergies  Allergen Reactions  . Cobalt Hives    Current antibiotics: Antibiotics Given (last 72 hours)    Date/Time Action Medication Dose Rate   07/29/19 0022 New Bag/Given   piperacillin-tazobactam (ZOSYN) IVPB 3.375 g 3.375 g 12.5 mL/hr   07/29/19 0543 New Bag/Given   piperacillin-tazobactam (ZOSYN) IVPB 3.375 g 3.375 g 12.5 mL/hr   07/29/19 1505 New Bag/Given   piperacillin-tazobactam (ZOSYN) IVPB 3.375 g 3.375 g 12.5 mL/hr   07/29/19 2353 New Bag/Given   piperacillin-tazobactam (ZOSYN) IVPB 3.375 g 3.375 g 12.5 mL/hr   07/30/19 0835 New Bag/Given   piperacillin-tazobactam (ZOSYN) IVPB 3.375 g 3.375 g 12.5 mL/hr   07/30/19 1748 New Bag/Given   piperacillin-tazobactam (ZOSYN) IVPB 3.375 g 3.375 g 12.5 mL/hr   07/31/19 0016 New Bag/Given   piperacillin-tazobactam (ZOSYN) IVPB 3.375 g 3.375 g 12.5 mL/hr   07/31/19 0826 New Bag/Given   piperacillin-tazobactam (ZOSYN) IVPB 3.375 g 3.375 g 12.5 mL/hr  07/31/19 1628 New Bag/Given   piperacillin-tazobactam (ZOSYN) IVPB 3.375 g 3.375 g 12.5 mL/hr      MEDICATIONS: . Chlorhexidine Gluconate Cloth  6 each Topical Daily  . Chlorhexidine Gluconate Cloth  6 each Topical Daily  . escitalopram  20 mg Oral Daily  . furosemide  20 mg Intravenous BID  . gabapentin  300 mg Oral QHS  . insulin aspart  0-9 Units Subcutaneous Q6H  . ketorolac  15 mg Intravenous Q6H  . levothyroxine   125 mcg Oral QAC breakfast  . pantoprazole (PROTONIX) IV  40 mg Intravenous QHS  . scopolamine  1 patch Transdermal Q72H  . sodium chloride flush  10-40 mL Intracatheter Q12H  . sodium chloride flush  5 mL Intracatheter Q8H    Review of Systems - 11 systems reviewed and negative per HPI   OBJECTIVE: Temp:  [98.4 F (36.9 C)-99 F (37.2 C)] 98.4 F (36.9 C) (03/17 1154) Pulse Rate:  [91-99] 95 (03/17 1154) Resp:  [20] 20 (03/17 1154) BP: (128-155)/(71-78) 132/73 (03/17 1154) SpO2:  [96 %-97 %] 97 % (03/17 1154) Physical Exam  Constitutional:  oriented to person, place, and time. appears well-developed and well-nourished. No distress.  HENT: [place]/AT, PERRLA, no scleral icterus Mouth/Throat: Oropharynx is clear and moist. No oropharyngeal exudate.  Cardiovascular: Normal rate, regular rhythm and normal heart sounds. Exam reveals no gallop and no friction rub.  No murmur heard.  Pulmonary/Chest: Effort normal and breath sounds normal. No respiratory distress.  has no wheezes.  Neck = supple, no nuchal rigidity Abdominal: Soft. Midline incision with mild dehisence, mild ss drainage, min redness and no purulence.  Perc drain in place with SS drainage. Lymphadenopathy: no cervical adenopathy. No axillary adenopathy Neurological: alert and oriented to person, place, and time.  Skin: Skin is warm and dry. No rash noted. No erythema.  R arm picc line in place. Psychiatric: a normal mood and affect.  behavior is normal.    LABS: Results for orders placed or performed during the hospital encounter of 07/22/19 (from the past 48 hour(s))  Potassium     Status: Abnormal   Collection Time: 07/29/19  8:49 PM  Result Value Ref Range   Potassium 2.9 (L) 3.5 - 5.1 mmol/L    Comment: Performed at [facility], [address] [place], [place] [address]  CBC with Differential/Platelet     Status: Abnormal   Collection Time: 07/30/19  5:41 AM  Result Value Ref Range   WBC 20.4 (H) 4.0 -  10.5 K/uL   RBC 2.33 (L) 3.87 - 5.11 MIL/uL   Hemoglobin 6.9 (L) 12.0 - 15.0 g/dL   HCT 21.4 (L) 36.0 - 46.0 %   MCV 91.8 80.0 - 100.0 fL   MCH 29.6 26.0 - 34.0 pg   MCHC 32.2 30.0 - 36.0 g/dL   RDW 14.1 11.5 - 15.5 %   Platelets 452 (H) 150 - 400 K/uL   nRBC 0.1 0.0 - 0.2 %   Neutrophils Relative % 77 %   Neutro Abs 15.7 (H) 1.7 - 7.7 K/uL   Lymphocytes Relative 9 %   Lymphs Abs 1.9 0.7 - 4.0 K/uL   Monocytes Relative 5 %   Monocytes Absolute 1.0 0.1 - 1.0 K/uL   Eosinophils Relative 2 %   Eosinophils Absolute 0.3 0.0 - 0.5 K/uL   Basophils Relative 0 %   Basophils Absolute 0.1 0.0 - 0.1 K/uL   WBC Morphology MILD LEFT SHIFT (1-5% METAS, OCC MYELO, OCC BANDS)  RBC Morphology MORPHOLOGY UNREMARKABLE    Smear Review Normal platelet morphology    Immature Granulocytes 7 %   Abs Immature Granulocytes 1.41 (H) 0.00 - 0.07 K/uL    Comment: Performed at [facility], [address] [place], [place] [address]  Basic metabolic panel     Status: Abnormal   Collection Time: 07/30/19  5:41 AM  Result Value Ref Range   Sodium 137 135 - 145 mmol/L   Potassium 3.1 (L) 3.5 - 5.1 mmol/L   Chloride 101 98 - 111 mmol/L   CO2 27 22 - 32 mmol/L   Glucose, Bld 88 70 - 99 mg/dL    Comment: Glucose reference range applies only to samples taken after fasting for at least 8 hours.   BUN <5 (L) 6 - 20 mg/dL   Creatinine, Ser 0.44 0.44 - 1.00 mg/dL   Calcium 7.0 (L) 8.9 - 10.3 mg/dL   GFR calc non Af Amer >60 >60 mL/min   GFR calc Af Amer >60 >60 mL/min   Anion gap 9 5 - 15    Comment: Performed at [facility], [address], [place], [place] [address]  Magnesium     Status: None   Collection Time: 07/30/19  5:41 AM  Result Value Ref Range   Magnesium 2.1 1.7 - 2.4 mg/dL    Comment: Performed at [facility], [address] [place], [place] [address]  Phosphorus     Status: Abnormal   Collection Time: 07/30/19  5:41 AM  Result Value Ref Range   Phosphorus  1.8 (L) 2.5 - 4.6 mg/dL    Comment: Performed at [facility], [address], [place], [place] [address]  Potassium     Status: Abnormal   Collection Time: 07/30/19  6:01 PM  Result Value Ref Range   Potassium 3.1 (L) 3.5 - 5.1 mmol/L    Comment: Performed at [facility], [address], [place], [place] [address]  Phosphorus     Status: None   Collection Time: 07/30/19  6:01 PM  Result Value Ref Range   Phosphorus 2.5 2.5 - 4.6 mg/dL    Comment: Performed at [facility], [address] [place], [place] [address]  Culture, blood (routine x 2)     Status: None (Preliminary result)   Collection Time: 07/30/19  6:02 PM   Specimen: BLOOD  Result Value Ref Range   Specimen Description BLOOD ALIN    Special Requests      BOTTLES DRAWN AEROBIC AND ANAEROBIC Blood Culture results may not be optimal due to an excessive volume of blood received in culture bottles   Culture      NO GROWTH < 24 HOURS Performed at [facility], [address] [place], [place] [address]    Report Status PENDING   Culture, blood (routine x 2)     Status: None (Preliminary result)   Collection Time: 07/30/19  7:02 PM   Specimen: BLOOD  Result Value Ref Range   Specimen Description BLOOD PORTA CATH    Special Requests      BOTTLES DRAWN AEROBIC AND ANAEROBIC Blood Culture results may not be optimal due to an excessive volume of blood received in culture bottles   Culture      NO GROWTH < 24 HOURS Performed at [facility], [address], [place],  [address]    Report Status PENDING   Glucose, capillary     Status: Abnormal   Collection  Time: 07/31/19 12:20 AM  Result Value Ref Range   Glucose-Capillary 121 (H) 70 - 99 mg/dL    Comment: Glucose reference range applies only to samples taken after fasting for at least 8 hours.   Comment 1 Notify RN   Glucose, capillary     Status: Abnormal   Collection Time: 07/31/19  5:49 AM  Result Value Ref  Range   Glucose-Capillary 139 (H) 70 - 99 mg/dL    Comment: Glucose reference range applies only to samples taken after fasting for at least 8 hours.  CBC with Differential/Platelet     Status: Abnormal   Collection Time: 07/31/19  6:14 AM  Result Value Ref Range   WBC 23.7 (H) 4.0 - 10.5 K/uL   RBC 2.33 (L) 3.87 - 5.11 MIL/uL   Hemoglobin 7.0 (L) 12.0 - 15.0 g/dL   HCT 21.4 (L) 36.0 - 46.0 %   MCV 91.8 80.0 - 100.0 fL   MCH 30.0 26.0 - 34.0 pg   MCHC 32.7 30.0 - 36.0 g/dL   RDW 14.2 11.5 - 15.5 %   Platelets 530 (H) 150 - 400 K/uL   nRBC 0.4 (H) 0.0 - 0.2 %   Neutrophils Relative % 79 %   Neutro Abs 18.7 (H) 1.7 - 7.7 K/uL   Lymphocytes Relative 8 %   Lymphs Abs 1.9 0.7 - 4.0 K/uL   Monocytes Relative 3 %   Monocytes Absolute 0.7 0.1 - 1.0 K/uL   Eosinophils Relative 2 %   Eosinophils Absolute 0.4 0.0 - 0.5 K/uL   Basophils Relative 0 %   Basophils Absolute 0.1 0.0 - 0.1 K/uL   WBC Morphology MILD LEFT SHIFT (1-5% METAS, OCC MYELO, OCC BANDS)    RBC Morphology MORPHOLOGY UNREMARKABLE    Smear Review Normal platelet morphology    Immature Granulocytes 8 %   Abs Immature Granulocytes 1.92 (H) 0.00 - 0.07 K/uL    Comment: Performed at [facility], [address] [place], [place] [address]  Basic metabolic panel     Status: Abnormal   Collection Time: 07/31/19  6:14 AM  Result Value Ref Range   Sodium 135 135 - 145 mmol/L   Potassium 2.9 (L) 3.5 - 5.1 mmol/L   Chloride 101 98 - 111 mmol/L   CO2 26 22 - 32 mmol/L   Glucose, Bld 138 (H) 70 - 99 mg/dL    Comment: Glucose reference range applies only to samples taken after fasting for at least 8 hours.   BUN <5 (L) 6 - 20 mg/dL   Creatinine, Ser 0.40 (L) 0.44 - 1.00 mg/dL   Calcium 7.1 (L) 8.9 - 10.3 mg/dL   GFR calc non Af Amer >60 >60 mL/min   GFR calc Af Amer >60 >60 mL/min   Anion gap 8 5 - 15    Comment: Performed at [facility], [address], [place], [place] [address]  Magnesium     Status:  None   Collection Time: 07/31/19  6:14 AM  Result Value Ref Range   Magnesium 2.1 1.7 - 2.4 mg/dL    Comment: Performed at [facility], [address] [place], [place] [address]  Phosphorus     Status: Abnormal   Collection Time: 07/31/19  6:14 AM  Result Value Ref Range   Phosphorus 1.8 (L) 2.5 - 4.6 mg/dL    Comment: Performed at [facility], [address], [place], [place] [address]  Prealbumin     Status: Abnormal  Collection Time: 07/31/19  6:14 AM  Result Value Ref Range   Prealbumin 5.6 (L) 18 - 38 mg/dL    Comment: Performed at [facility] [address] [place], [place] [address]  Triglycerides     Status: Abnormal   Collection Time: 07/31/19  6:14 AM  Result Value Ref Range   Triglycerides 198 (H) <150 mg/dL    Comment: Performed at [facility], [place], [place] [address]  Glucose, capillary     Status: Abnormal   Collection Time: 07/31/19 11:53 AM  Result Value Ref Range   Glucose-Capillary 123 (H) 70 - 99 mg/dL    Comment: Glucose reference range applies only to samples taken after fasting for at least 8 hours.   Comment 1 Notify RN   C difficile quick scan w PCR reflex     Status: None   Collection Time: 07/31/19  4:38 PM   Specimen: STOOL  Result Value Ref Range   C Diff antigen NEGATIVE NEGATIVE   C Diff toxin NEGATIVE NEGATIVE   C Diff interpretation No C. difficile detected.     Comment: Performed at [facility], [address] [place], [place] [address]  Glucose, capillary     Status: None   Collection Time: 07/31/19  5:49 PM  Result Value Ref Range   Glucose-Capillary 99 70 - 99 mg/dL    Comment: Glucose reference range applies only to samples taken after fasting for at least 8 hours.   Comment 1 Notify RN    No components found for: ESR, C REACTIVE PROTEIN MICRO: Recent Results (from the past 720 hour(s))  SARS CORONAVIRUS 2 (TAT 6-24 HRS) Nasopharyngeal Nasopharyngeal Swab      Status: None   Collection Time: 07/18/19 11:58 AM   Specimen: Nasopharyngeal Swab  Result Value Ref Range Status   SARS Coronavirus 2 NEGATIVE NEGATIVE Final    Comment: (NOTE) SARS-CoV-2 target nucleic acids are NOT DETECTED. The SARS-CoV-2 RNA is generally detectable in upper and lower respiratory specimens during the acute phase of infection. Negative results do not preclude SARS-CoV-2 infection, do not rule out co-infections with other pathogens, and should not be used as the sole basis for treatment or other patient management decisions. Negative results must be combined with clinical observations, patient history, and epidemiological information. The expected result is Negative. Fact Sheet for Patients: SugarRoll.be Fact Sheet for Healthcare Providers: https://www.woods-mathews.com/ This test is not yet approved or cleared by the [place] FDA and  has been authorized for detection and/or diagnosis of SARS-CoV-2 by FDA under an Emergency Use Authorization (EUA). This EUA will remain  in effect (meaning this test can be used) for the duration of the COVID-19 declaration under Section 56 4(b)(1) of the Act, 21 U.S.C. section 360bbb-3(b)(1), unless the authorization is terminated or revoked sooner. Performed at [facility], [place] [address] [place], [place] [address]   Urine Culture     Status: None   Collection Time: 07/23/19  4:00 PM   Specimen: Urine, Catheterized  Result Value Ref Range Status   Specimen Description   Final    URINE, CATHETERIZED Performed at [facility], [address], [place], [place] [address]    Special Requests   Final    NONE Performed at [facility], [address] [place],  [address]    Culture   Final    NO GROWTH Performed at [facility], [place] [address], [place], [place]  [address]    Report Status 07/24/2019 FINAL  Final  Aerobic/Anaerobic Culture  (surgical/deep wound)     Status: None (Preliminary result)   Collection Time: 07/26/19  4:42 PM   Specimen: PATH Other; Tissue  Result Value Ref Range Status   Specimen Description   Final    FLUID ABDOMEN RLQ Performed at [facility], [address] [place], [place] [address]    Special Requests   Final    NONE Performed at [facility], [address] [place], [place] [address]    Gram Stain   Final    FEW WBC PRESENT, PREDOMINANTLY PMN NO ORGANISMS SEEN    Culture   Final    NO GROWTH 4 DAYS NO ANAEROBES ISOLATED; CULTURE IN PROGRESS FOR 5 DAYS Performed at [facility] [address], [place], [place] [address]    Report Status PENDING  Incomplete  Urine Culture     Status: None   Collection Time: 07/28/19 12:03 AM   Specimen: PATH Other; Urine  Result Value Ref Range Status   Specimen Description   Final    URINE, RANDOM Performed at [facility], [address], [place], [place] [address]    Special Requests   Final    NONE Performed at [facility], [address] [place], [place] [address]    Culture   Final    NO GROWTH Performed at [facility], [place] [address], [place], [place] [address]    Report Status 07/29/2019 FINAL  Final  Culture, blood (routine x 2)     Status: None (Preliminary result)   Collection Time: 07/30/19  6:02 PM   Specimen: BLOOD  Result Value Ref Range Status   Specimen Description BLOOD ALIN  Final   Special Requests   Final    BOTTLES DRAWN AEROBIC AND ANAEROBIC Blood Culture results may not be optimal due to an excessive volume of blood received in culture bottles   Culture   Final    NO GROWTH < 24 HOURS Performed at [facility], [address] [place], [place] [address]    Report Status PENDING  Incomplete  Culture, blood (routine x 2)     Status: None (Preliminary result)   Collection Time: 07/30/19  7:02 PM   Specimen: BLOOD  Result Value Ref Range Status    Specimen Description BLOOD PORTA CATH  Final   Special Requests   Final    BOTTLES DRAWN AEROBIC AND ANAEROBIC Blood Culture results may not be optimal due to an excessive volume of blood received in culture bottles   Culture   Final    NO GROWTH < 24 HOURS Performed at [facility], [address] [place], [place] [address]    Report Status PENDING  Incomplete  C difficile quick scan w PCR reflex     Status: None   Collection Time: 07/31/19  4:38 PM   Specimen: STOOL  Result Value Ref Range Status   C Diff antigen NEGATIVE NEGATIVE Final   C Diff toxin NEGATIVE NEGATIVE Final   C Diff interpretation No C. difficile detected.  Final    Comment: Performed at [facility], [address] [place], [place] [address]    IMAGING: [place] Chest 1 View  Result Date: 07/28/2019 CLINICAL DATA:  Tachypnea, recent hysterectomy EXAM: CHEST  1 VIEW COMPARISON:  07/27/2019 chest radiograph. FINDINGS: Low lung volumes. Stable cardiomediastinal silhouette with normal heart size. No pneumothorax. No pleural  effusion. Mild right basilar atelectasis, unchanged. No pulmonary edema. No acute consolidative airspace disease. IMPRESSION: Stable low lung volumes with mild right basilar atelectasis. Electronically Signed   By: Ilona Sorrel M.D.   On: 07/28/2019 16:45   DG Chest 1 View  Result Date: 07/27/2019 CLINICAL DATA:  Tachypnea.  Recent abdominal surgery. EXAM: CHEST  1 VIEW COMPARISON:  11/03/2010 FINDINGS: Lung volumes are low. There is opacity at the right lung base consistent with atelectasis. Remainder of the lungs is clear. No convincing pleural effusion and no pneumothorax. Cardiac silhouette is normal in size. No mediastinal or hilar masses. Skeletal structures are grossly intact. IMPRESSION: 1. No acute cardiopulmonary disease. 2. Mild right lung base atelectasis. Electronically Signed   By: Lajean Manes M.D.   On: 07/27/2019 16:15   DG Abd 1 View  Result Date: 07/22/2019 CLINICAL  DATA:  Evaluate for possible retained sponge EXAM: ABDOMEN - 1 VIEW COMPARISON:  None. FINDINGS: Scattered large and small bowel gas is noted. No abnormal mass or abnormal calcifications are seen. Postsurgical changes are noted. No radiopaque foreign body is identified to suggest retained sponge. IMPRESSION: No evidence of retained foreign body. These results will be called to the ordering clinician or representative by the Radiologist Assistant, and communication documented in the PACS or zVision Dashboard. Electronically Signed   By: Inez Catalina M.D.   On: 07/22/2019 22:49   [place] Abscess Drain  Result Date: 07/26/2019 INDICATION: History of hysterectomy complicated by small bowel injury requiring resection and primary anastomosis. CT scan of the abdomen and pelvis performed earlier today secondary to elevated white blood cell count and fever demonstrated indeterminate fluid collections within the abdomen with dominant collection within the right lower abdominal quadrant As such, request made for ultrasound-guided paracentesis versus drainage catheter placement for and diagnostic and potentially therapeutic purposes. EXAM: ULTRASOUND GUIDED ABSCESS DRAINAGE COMPARISON:  CT abdomen and pelvis - earlier same day MEDICATIONS: The patient is currently admitted to the hospital and receiving intravenous antibiotics. The antibiotics were administered within an appropriate time frame prior to the initiation of the procedure. ANESTHESIA/SEDATION: None CONTRAST:  None COMPLICATIONS: None immediate. PROCEDURE: Informed written consent was obtained from the patient after a discussion of the risks, benefits and alternatives to treatment. The patient was placed supine on her hospital bed and sonographic evaluation was performed of the abdomen demonstrating ill-defined fluid scattered throughout the abdomen with dominant collection within right lower abdominal quadrant measuring approximately 9.6 x 8.2 x 3.9 cm, correlating  with the dominant collection seen on preceding abdominal CT image 66, series 2. The procedure was planned. A timeout was performed prior to the initiation of the procedure. The skin overlying the right lower abdomen was prepped and draped in the usual sterile fashion. The overlying soft tissues were anesthetized with 1% lidocaine with epinephrine. Appropriate trajectory was planned with the use of a 22 gauge spinal needle. An 18 gauge trocar needle was advanced into the abscess/fluid collection and a short Amplatz super stiff wire was coiled within the collection. Multiple ultrasound images were saved for procedural documentation purposes Next, the track was dilated ultimately allowing placement of a 10 [place] all-purpose drainage catheter. Next, approximately 150 cc of brown serous ascitic fluid was aspirated. A representative sample was capped and sent to the laboratory for analysis. The tube was connected to a drainage bag and sutured in place. A dressing was placed. The patient tolerated the procedure well without immediate post procedural complication. IMPRESSION: Successful ultrasound guided placement of a  10 [place] all purpose drain catheter into the dominant collection within the right lower abdomen with aspiration of 150 cc of brown, serous ascitic fluid. Samples were sent to the laboratory as requested by the ordering clinical team. Electronically Signed   By: Sandi Mariscal M.D.   On: 07/26/2019 17:30   CT ABDOMEN PELVIS W CONTRAST  Result Date: 07/31/2019 CLINICAL DATA:  45 yo F s/p exp. Lap and repair of small bowel enterotomy as well as hysterectomy with OB/GYN and surgery involved. Reexploration abdominal washout and resection of original anastomosis today 3/14. EXAM: CT ABDOMEN AND PELVIS WITH CONTRAST TECHNIQUE: Multidetector CT imaging of the abdomen and pelvis was performed using the standard protocol following bolus administration of intravenous contrast. CONTRAST:  15m OMNIPAQUE IOHEXOL 300  MG/ML  SOLN COMPARISON:  07/26/2019 FINDINGS: Lower chest: Dependent atelectasis, greater on the right, similar to the prior CT. No acute findings. Hepatobiliary: No focal liver abnormality is seen. Status post cholecystectomy. No biliary dilatation. Pancreas: Unremarkable. No pancreatic ductal dilatation or surrounding inflammatory changes. Spleen: Normal in size without focal abnormality. Adrenals/Urinary Tract: No adrenal masses. Stable low-density renal masses consistent with cysts on the left. Mild prominence of the right intrarenal collecting system. No renal or ureteral stones. No left renal collecting system dilation. Normal left ureter. Symmetric renal enhancement and excretion. Bladder minimally distended. Foley catheter in place. No bladder mass. Stomach/Bowel: Normal stomach. Mesenteric edema and inflammation most evident in the central to lower abdomen. Right lower quadrant percutaneously placed drainage catheter significantly decreases the size of the previously seen right lower quadrant collection. Residual collection measures 6.4 x 2.9 cm transversely. Small anterior less well-defined fluid collection is seen, to the right of midline, near the level of the umbilicus, 6.3 x 1.9 cm transversely. Fluid is noted within leaves of the small bowel mesentery most evident mid to lower abdomen, without additional defined collection. Bowel anastomosis staples are noted upper anterior pelvis extending to the right lower quadrant. No evidence of bowel obstruction. Several loops of central to lower abdomen small bowel demonstrate mild wall thickening. Colon is mostly decompressed. No colonic wall thickening. Vascular/Lymphatic: No vascular abnormality. Several prominent mesenteric lymph nodes most evident right lower quadrant, all subcentimeter and similar to the prior CT. Reproductive: Status post hysterectomy. No adnexal masses. Other: Anterior abdominal wall air adjacent to the midline incision. No hernia. No  abdominal wall collection to suggest abscess. Small amount of ascites. Tiny bubbles of free intraperitoneal air are noted deep to the midline incision. Musculoskeletal: No acute or significant abnormality. IMPRESSION: 1. No bowel obstruction. Mild small bowel wall thickening in the lower abdomen is likely reactive/edema from the recent surgery. 2. Significant improvement the right lower quadrant collection following placement of a percutaneous drainage catheter. 3. No new abdominal collections. Small amount of ascites and diffuse mesenteric edema most evident mid to lower abdomen. 4. Few tiny bubbles of anterior midline free air, presumed postsurgical. Electronically Signed   By: DLajean ManesM.D.   On: 07/31/2019 10:48   CT ABDOMEN PELVIS W CONTRAST  Result Date: 07/26/2019 CLINICAL DATA:  Abdominal pain and tenderness. Leukocytosis. Recent small bowel resection and hysterectomy. EXAM: CT ABDOMEN AND PELVIS WITH CONTRAST TECHNIQUE: Multidetector CT imaging of the abdomen and pelvis was performed using the standard protocol following bolus administration of intravenous contrast. CONTRAST:  127mOMNIPAQUE IOHEXOL 300 MG/ML  SOLN COMPARISON:  06/21/2019 FINDINGS: Lower Chest: New bibasilar atelectasis. Hepatobiliary: No hepatic masses identified. Prior cholecystectomy. No evidence of biliary obstruction. Pancreas:  No mass or inflammatory changes. Spleen: Within normal limits in size and appearance. Adrenals/Urinary Tract: Stable tiny bilateral renal cysts. No masses identified. No evidence of ureteral calculi or hydronephrosis. Stomach/Bowel: A small amount of extraperitoneal air in the lower pelvis, and intraperitoneal air, are consistent with recent surgery. No evidence of bowel obstruction. Mildly dilated small bowel loops are seen containing air-fluid levels and increased gaseous distention of colon is also seen, suspicious for adynamic ileus. Diffuse mesenteric inflammatory changes or edema are new since  previous study. Multiple small loculated fluid intraperitoneal fluid collections are seen in the abdomen and pelvis, 1 in the right lower quadrant measuring 9.8 x 5.1 cm on image 66/2. Vascular/Lymphatic: No pathologically enlarged lymph nodes. No abdominal aortic aneurysm. Reproductive: Prior hysterectomy noted. Rim enhancing fluid collection seen in the hysterectomy bed which measures 8.0 x 7.4 cm on image 67/7. Other:  None. Musculoskeletal:  No suspicious bone lesions identified. IMPRESSION: 1. Small amount of intraperitoneal and extraperitoneal air, consistent with recent surgery. 2. Multiple loculated intraperitoneal fluid collections in the abdomen and pelvis, largest in the right lower quadrant measuring 9.8 cm and pelvic cul-de-sac measuring 8.0 cm, suspicious for abscesses. 3. Mild diffuse dilatation of small bowel and colon, suspicious for adynamic ileus. 4. New bibasilar atelectasis. Electronically Signed   By: Marlaine Hind M.D.   On: 07/26/2019 13:38   US Abdomen Limited  Result Date: 07/30/2019 CLINICAL DATA:  Drainage from abdominal wall incision site for hysterectomy. Evaluate for abscess. EXAM: LIMITED ULTRASOUND OF ABDOMINAL SOFT TISSUES TECHNIQUE: Ultrasound examination of the anterior abdominal wall soft tissues was performed in the area of clinical concern at the previous incision site. COMPARISON:  None. FINDINGS: A small fluid collection is seen within the subcutaneous tissues of the midline abdominal wall just above the umbilicus which measures 0.5 x 0.5 x 1.1 cm. A 2nd small complex fluid collection is seen just below the level of the umbilicus which measures 1.3 x 0.5 x 1.1 cm. IMPRESSION: Two small approximately 1 cm complex fluid collections are identified in the midline abdominal wall subcutaneous tissues. These may represent postop seromas or abscesses. Electronically Signed   By: Marlaine Hind M.D.   On: 07/30/2019 11:41   US Venous Img Lower Bilateral (DVT)  Result Date:  07/27/2019 CLINICAL DATA:  Tachycardia.  History of recent surgery. EXAM: BILATERAL LOWER EXTREMITY VENOUS DOPPLER ULTRASOUND TECHNIQUE: Gray-scale sonography with compression, as well as color and duplex ultrasound, were performed to evaluate the deep venous system(s) from the level of the common femoral vein through the popliteal and proximal calf veins. COMPARISON:  None. FINDINGS: VENOUS Normal compressibility of the common femoral, superficial femoral, and popliteal veins, as well as the visualized calf veins. Visualized portions of profunda femoral vein and great saphenous vein unremarkable. No filling defects to suggest DVT on grayscale or color Doppler imaging. Doppler waveforms show normal direction of venous flow, normal respiratory phasicity and response to augmentation. Limited views of the contralateral common femoral vein are unremarkable. OTHER Edema noted in the legs bilaterally. Limitations: none IMPRESSION: No femoropopliteal DVT nor evidence of DVT within the visualized calf veins. If clinical symptoms are inconsistent or if there are persistent or worsening symptoms, further imaging (possibly involving the iliac veins) may be warranted. Electronically Signed   By: Lajean Manes M.D.   On: 07/27/2019 16:16   [place] EKG SITE RITE  Result Date: 07/30/2019 If Site Rite image not attached, placement could not be confirmed due to current cardiac rhythm.  Assessment:   JALEEN GRUPP is a 44 y.o. female with complicated surgical history since 3/8 TAH complicated by SB perf and repair, with anastomoses leak and repeat surgery 3/14. Has persistent leukocytosis despite zosyn. No fevers. Possible sources include line infection, wound infection, intraabdominal source, TPN associated fungal infection, UTI.   Recommendations BCX pending Cont zosyn If wbc persists or develops fevers wound add micafungin given intrabd abscess. Send culture from the drain to look for resistant organisms not covered by  Zosyn - VRE, ESBL.  Thank you very much for allowing me to participate in the care of this patient. Please call with questions.   Cheral Marker. Ola Spurr, MD

## 2019-07-31 NOTE — Progress Notes (Signed)
PHARMACY - TOTAL PARENTERAL NUTRITION CONSULT NOTE   Indication:  s/p laparoscopic converted to open total hysterectomy, left salpingectomy, cystoscopy and 25 cm small bowel resection (near distal ileum) secondary to bowel injury 3/8. Pt declined post operatively requiring another exploratory laparotomy, lysis of adhesions, abdominal wound washout, resection of previous anastomosis with ~20cm of small bowel removed and subsequent reanastomosis 3/14   Patient Measurements: Height: 5\' 2"  (157.5 cm) Weight: 253 lb 4.9 oz (114.9 kg) IBW/kg (Calculated) : 50.1 TPN AdjBW (KG): 70.9 Body mass index is 46.33 kg/m.  Assessment: 44 y.o. female  Hospital stay day 9,  exploratory laparotomy and repair of small bowel enterotomy as well as hysterectomy with OB/GYN 4 Days Post-Op re-exploration abdominal washout and resection of original anastomosis  Glucose / Insulin: start SSI q6h Electrolytes:   Repeat 45 mmol IV potassium phosphate (contains 66 mEq potassium) Renal: Scr <1, stable LFTs / TGs:  Prealbumin / albumin:  Intake / Output; MIVF:  GI Imaging: Surgeries / Procedures:   Central access:  07/30/19 TPN start date: 07/30/19  Nutritional Goals (per RD recommendation  Kcal:  2400-2700kcal/day Goal TPN rate is 83 mL/hr (Regimen at goal rate will provide 2472kcal/day, 100g/day protein, 2346ml volume ) Protein:  120-135g/day  Fluid: MIVF 0.9% saline stopped  Current Nutrition:  NPO  Plan:   Continue Clinimix 5/20 with electrolytes at 20ml/hr (Goal rate 20ml/hr once labs stable) at 1800.   Add MVI on Mondays and Thursdays, trace elements daily  add Thiamine x 3 days (3/16-3/18)  Continue 20% lipids @30ml /hr x 12 hrs/day   continue Sensitive q6h SSI and adjust as needed   Pt at high refeeding risk; check P, K, and Mg daily for 3 days after TPN iniatition.   Monitor TPN labs for 1st 3 days and then on Mon/Thurs, per protocol  Dallie Piles 07/31/2019,9:25 AM

## 2019-07-31 NOTE — Progress Notes (Signed)
Subjective:  CC: Karen Aguirre is a 44 y.o. female  Hospital stay day 9,  exploratory laparotomy and repair of small bowel enterotomy as well as hysterectomy with OB/GYN  4 Days Post-Op  Reexploration abdominal washout and resection of original anastomosis   HPI: No acute events postop.  Patient had some sleeping issues overnight again.  No nightmares but just difficult to sleep..  Still having bouts of nausea and emesis.  Now with multiple reports of watery diarrhea.  ROS:  General: Denies weight loss, weight gain, fatigue, fevers, chills, and night sweats. Heart: Denies chest pain, palpitations, racing heart, irregular heartbeat, leg pain or swelling, and decreased activity tolerance. Respiratory: Denies breathing difficulty, shortness of breath, wheezing, cough, and sputum. GI: Denies change in appetite, heartburn, constipation, diarrhea, and blood in stool. GU: Denies difficulty urinating, pain with urinating, urgency, frequency, blood in urine.   Objective:   Temp:  [98.4 F (36.9 C)-99 F (37.2 C)] 98.4 F (36.9 C) (03/17 1154) Pulse Rate:  [91-99] 95 (03/17 1154) Resp:  [20] 20 (03/17 1154) BP: (128-155)/(71-78) 132/73 (03/17 1154) SpO2:  [96 %-97 %] 97 % (03/17 1154)     Height: 5\' 2"  (157.5 cm) Weight: 114.9 kg BMI (Calculated): 46.32   Intake/Output this shift:   Intake/Output Summary (Last 24 hours) at 07/31/2019 2003 Last data filed at 07/31/2019 1900 Gross per 24 hour  Intake 1141.59 ml  Output 3082 ml  Net -1940.41 ml  200 mL of bilious output from IR drain  Constitutional :  alert, cooperative, appears stated age and mild distress  Respiratory:  clear to auscultation bilaterally  Cardiovascular:  regular rate and rhythm  Gastrointestinal: Soft, continued decreased guarding and tenderness all 4 quadrants.  Midline incision with mild purulent discharge from 2cm opening in midline incision now near umbilicus, nonindurated, no erythema, and some bloody discharge  on dressing.  Port sites remain clean dry and intact.  IR drain continues to be serous.   Skin: Cool and moist.   Psychiatric: Normal affect, non-agitated, not confused       LABS:  CMP Latest Ref Rng & Units 07/31/2019 07/30/2019 07/30/2019  Glucose 70 - 99 mg/dL 138(H) - 88  BUN 6 - 20 mg/dL <5(L) - <5(L)  Creatinine 0.44 - 1.00 mg/dL 0.40(L) - 0.44  Sodium 135 - 145 mmol/L 135 - 137  Potassium 3.5 - 5.1 mmol/L 2.9(L) 3.1(L) 3.1(L)  Chloride 98 - 111 mmol/L 101 - 101  CO2 22 - 32 mmol/L 26 - 27  Calcium 8.9 - 10.3 mg/dL 7.1(L) - 7.0(L)  Total Protein 6.5 - 8.1 g/dL - - -  Total Bilirubin 0.3 - 1.2 mg/dL - - -  Alkaline Phos 38 - 126 U/L - - -  AST 15 - 41 U/L - - -  ALT 0 - 44 U/L - - -   CBC Latest Ref Rng & Units 07/31/2019 07/30/2019 07/29/2019  WBC 4.0 - 10.5 K/uL 23.7(H) 20.4(H) 19.9(H)  Hemoglobin 12.0 - 15.0 g/dL 7.0(L) 6.9(L) 7.4(L)  Hematocrit 36.0 - 46.0 % 21.4(L) 21.4(L) 22.3(L)  Platelets 150 - 400 K/uL 530(H) 452(H) 438(H)    RADS: CLINICAL DATA:  44 yo F s/p exp. Lap and repair of small bowel enterotomy as well as hysterectomy with OB/GYN and surgery involved. Reexploration abdominal washout and resection of original anastomosis today 3/14.  EXAM: CT ABDOMEN AND PELVIS WITH CONTRAST  TECHNIQUE: Multidetector CT imaging of the abdomen and pelvis was performed using the standard protocol following bolus administration of  intravenous contrast.  CONTRAST:  174mL OMNIPAQUE IOHEXOL 300 MG/ML  SOLN  COMPARISON:  07/26/2019  FINDINGS: Lower chest: Dependent atelectasis, greater on the right, similar to the prior CT. No acute findings.  Hepatobiliary: No focal liver abnormality is seen. Status post cholecystectomy. No biliary dilatation.  Pancreas: Unremarkable. No pancreatic ductal dilatation or surrounding inflammatory changes.  Spleen: Normal in size without focal abnormality.  Adrenals/Urinary Tract: No adrenal masses. Stable low-density  renal masses consistent with cysts on the left. Mild prominence of the right intrarenal collecting system. No renal or ureteral stones. No left renal collecting system dilation. Normal left ureter. Symmetric renal enhancement and excretion. Bladder minimally distended. Foley catheter in place. No bladder mass.  Stomach/Bowel: Normal stomach.  Mesenteric edema and inflammation most evident in the central to lower abdomen. Right lower quadrant percutaneously placed drainage catheter significantly decreases the size of the previously seen right lower quadrant collection. Residual collection measures 6.4 x 2.9 cm transversely. Small anterior less well-defined fluid collection is seen, to the right of midline, near the level of the umbilicus, 6.3 x 1.9 cm transversely. Fluid is noted within leaves of the small bowel mesentery most evident mid to lower abdomen, without additional defined collection. Bowel anastomosis staples are noted upper anterior pelvis extending to the right lower quadrant.  No evidence of bowel obstruction. Several loops of central to lower abdomen small bowel demonstrate mild wall thickening. Colon is mostly decompressed. No colonic wall thickening.  Vascular/Lymphatic: No vascular abnormality.  Several prominent mesenteric lymph nodes most evident right lower quadrant, all subcentimeter and similar to the prior CT.  Reproductive: Status post hysterectomy. No adnexal masses.  Other: Anterior abdominal wall air adjacent to the midline incision. No hernia. No abdominal wall collection to suggest abscess. Small amount of ascites. Tiny bubbles of free intraperitoneal air are noted deep to the midline incision.  Musculoskeletal: No acute or significant abnormality.  IMPRESSION: 1. No bowel obstruction. Mild small bowel wall thickening in the lower abdomen is likely reactive/edema from the recent surgery. 2. Significant improvement the right lower quadrant  collection following placement of a percutaneous drainage catheter. 3. No new abdominal collections. Small amount of ascites and diffuse mesenteric edema most evident mid to lower abdomen. 4. Few tiny bubbles of anterior midline free air, presumed postsurgical.  Assessment:   exploratory laparotomy and repair of small bowel enterotomy as well as hysterectomy with OB/GYN  Reexploration abdominal washout and resection of original anastomosis.  Slightly more alert today, clinical exam best it has so far.  Vitals stable.   Despite this, leukocytosis increasing.  C. difficile negative, improving fluid collections overall but still present.  ID consulted for further evaluation.  Pending recs.  May need to consider additional IR drainage.  Blood cultures negative to date  hemoglobin stable.  Resume Lovenox in a.m.   U/o adequate, now clear.  Will discontinue Foley.  Cultures NGTD.    PICC in place.  Continue TPN for now.  Ideally would like to see drop in leukocytosis and persistent nausea vomiting prior to potentially starting to feed

## 2019-08-01 LAB — CBC WITH DIFFERENTIAL/PLATELET
Abs Immature Granulocytes: 1.97 10*3/uL — ABNORMAL HIGH (ref 0.00–0.07)
Basophils Absolute: 0.1 10*3/uL (ref 0.0–0.1)
Basophils Relative: 0 %
Eosinophils Absolute: 0.4 10*3/uL (ref 0.0–0.5)
Eosinophils Relative: 2 %
HCT: 22 % — ABNORMAL LOW (ref 36.0–46.0)
Hemoglobin: 7.1 g/dL — ABNORMAL LOW (ref 12.0–15.0)
Immature Granulocytes: 10 %
Lymphocytes Relative: 13 %
Lymphs Abs: 2.6 10*3/uL (ref 0.7–4.0)
MCH: 29.8 pg (ref 26.0–34.0)
MCHC: 32.3 g/dL (ref 30.0–36.0)
MCV: 92.4 fL (ref 80.0–100.0)
Monocytes Absolute: 0.9 10*3/uL (ref 0.1–1.0)
Monocytes Relative: 5 %
Neutro Abs: 13.5 10*3/uL — ABNORMAL HIGH (ref 1.7–7.7)
Neutrophils Relative %: 70 %
Platelets: 606 10*3/uL — ABNORMAL HIGH (ref 150–400)
RBC: 2.38 MIL/uL — ABNORMAL LOW (ref 3.87–5.11)
RDW: 14.3 % (ref 11.5–15.5)
Smear Review: UNDETERMINED
WBC: 19.5 10*3/uL — ABNORMAL HIGH (ref 4.0–10.5)
nRBC: 1.1 % — ABNORMAL HIGH (ref 0.0–0.2)

## 2019-08-01 LAB — COMPREHENSIVE METABOLIC PANEL
ALT: 33 U/L (ref 0–44)
AST: 34 U/L (ref 15–41)
Albumin: 2.2 g/dL — ABNORMAL LOW (ref 3.5–5.0)
Alkaline Phosphatase: 64 U/L (ref 38–126)
Anion gap: 4 — ABNORMAL LOW (ref 5–15)
BUN: <5 mg/dL — ABNORMAL LOW (ref 6–20)
CO2: 30 mmol/L (ref 22–32)
Calcium: 7.6 mg/dL — ABNORMAL LOW (ref 8.9–10.3)
Chloride: 106 mmol/L (ref 98–111)
Creatinine, Ser: 0.4 mg/dL — ABNORMAL LOW (ref 0.44–1.00)
GFR calc Af Amer: >60 mL/min (ref >60)
GFR calc non Af Amer: >60 mL/min (ref >60)
Glucose, Bld: 118 mg/dL — ABNORMAL HIGH (ref 70–99)
Potassium: 3 mmol/L — ABNORMAL LOW (ref 3.5–5.1)
Sodium: 140 mmol/L (ref 135–145)
Total Bilirubin: 0.6 mg/dL (ref 0.3–1.2)
Total Protein: 6 g/dL — ABNORMAL LOW (ref 6.5–8.1)

## 2019-08-01 LAB — AEROBIC/ANAEROBIC CULTURE W GRAM STAIN (SURGICAL/DEEP WOUND): Culture: NO GROWTH

## 2019-08-01 LAB — GLUCOSE, CAPILLARY
Glucose-Capillary: 107 mg/dL — ABNORMAL HIGH (ref 70–99)
Glucose-Capillary: 108 mg/dL — ABNORMAL HIGH (ref 70–99)
Glucose-Capillary: 109 mg/dL — ABNORMAL HIGH (ref 70–99)
Glucose-Capillary: 120 mg/dL — ABNORMAL HIGH (ref 70–99)
Glucose-Capillary: 121 mg/dL — ABNORMAL HIGH (ref 70–99)

## 2019-08-01 LAB — PHOSPHORUS: Phosphorus: 2.9 mg/dL (ref 2.5–4.6)

## 2019-08-01 LAB — MAGNESIUM: Magnesium: 2 mg/dL (ref 1.7–2.4)

## 2019-08-01 MED ORDER — ENOXAPARIN SODIUM 40 MG/0.4ML ~~LOC~~ SOLN
40.0000 mg | SUBCUTANEOUS | Status: DC
  Administered 2019-08-01: 23:00:00 40 mg via SUBCUTANEOUS
  Filled 2019-08-01: qty 0.4

## 2019-08-01 MED ORDER — SODIUM CHLORIDE 0.9 % IV SOLN
Freq: Once | INTRAVENOUS | Status: AC
  Filled 2019-08-01: qty 20

## 2019-08-01 MED ORDER — PROMETHAZINE HCL 25 MG/ML IJ SOLN
25.0000 mg | Freq: Four times a day (QID) | INTRAMUSCULAR | Status: DC | PRN
  Administered 2019-08-01 – 2019-08-11 (×10): 25 mg via INTRAVENOUS
  Filled 2019-08-01 (×10): qty 1

## 2019-08-01 MED ORDER — POTASSIUM CHLORIDE 10 MEQ/100ML IV SOLN: 10.0000 meq | INTRAVENOUS | Status: DC

## 2019-08-01 MED ORDER — POTASSIUM CHLORIDE CRYS ER 20 MEQ PO TBCR
40.0000 meq | EXTENDED_RELEASE_TABLET | Freq: Three times a day (TID) | ORAL | Status: DC
  Administered 2019-08-01: 09:00:00 40 meq via ORAL
  Filled 2019-08-01: qty 2

## 2019-08-01 MED ORDER — FAT EMULSION PLANT BASED 20 % IV EMUL
360.0000 mL | INTRAVENOUS | Status: AC
  Administered 2019-08-01: 360 mL via INTRAVENOUS
  Filled 2019-08-01: qty 360

## 2019-08-01 MED ORDER — TRACE MINERALS CU-MN-SE-ZN 300-55-60-3000 MCG/ML IV SOLN
INTRAVENOUS | Status: AC
  Filled 2019-08-01: qty 1992

## 2019-08-01 NOTE — Progress Notes (Signed)
PHARMACY - TOTAL PARENTERAL NUTRITION CONSULT NOTE   Indication:  s/p laparoscopic converted to open total hysterectomy, left salpingectomy, cystoscopy and 25 cm small bowel resection (near distal ileum) secondary to bowel injury 3/8. Pt declined post operatively requiring another exploratory laparotomy, lysis of adhesions, abdominal wound washout, resection of previous anastomosis with ~20cm of small bowel removed and subsequent reanastomosis 3/14   Patient Measurements: Height: 5\' 2"  (157.5 cm) Weight: 278 lb 10.6 oz (126.4 kg) IBW/kg (Calculated) : 50.1 TPN AdjBW (KG): 70.9 Body mass index is 50.97 kg/m.  Assessment: 44 y.o. female  Hospital stay day 10,  exploratory laparotomy and repair of small bowel enterotomy as well as hysterectomy with OB/GYN 5 Days Post-Op re-exploration abdominal washout and resection of original anastomosis  Glucose / Insulin: continue SSI q6h Electrolytes:   Replete with 50 mEq IV KCl  Re-check electrolytes with 3/19 am labs Renal: Scr <1, stable LFTs / TGs:  Prealbumin / albumin:  Intake / Output; MIVF:  GI Imaging: Surgeries / Procedures:   Central access:  07/30/19 TPN start date: 07/30/19  Nutritional Goals (per RD recommendation  Kcal:  2400-2700kcal/day Goal TPN rate is 83 mL/hr (Regimen at goal rate will provide 2472kcal/day, 100g/day protein, 2342ml volume ) Protein:  120-135g/day  Fluid: MIVF 0.9% saline stopped  Current Nutrition:  NPO  Plan:   Advance Clinimix 5/20 with electrolytes to goal rate of 83 ml/hr  Add MVI on Mondays and Thursdays, trace elements daily  add Thiamine 100 mg daily x 3 days (3/16-3/18): this is the final day  Continue 20% lipids @30ml /hr x 12 hrs/day   continue Sensitive q6h SSI and adjust as needed   Pt at high refeeding risk; check P, K, and Mg daily for 3 days after TPN iniatition.   Monitor TPN labs for 1st 3 days and then on Mon/Thurs, per protocol  Dallie Piles 08/01/2019,7:08 AM

## 2019-08-01 NOTE — Progress Notes (Signed)
[place] INFECTIOUS DISEASE PROGRESS NOTE Date of Admission:  07/22/2019     ID: Karen Aguirre is a 44 y.o. female with  leukocytosis Active Problems:   Fibroid uterus   Pelvic pain   S/P laparotomy   Subjective: No fevers.  Up and walking.  Remains on TPN.  Blood cultures negative.  Gram stain of fluid from abdominal drain has a few white blood cells present and no organisms.  No growth. C. difficile was negative. ROS  Eleven systems are reviewed and negative except per hpi  Medications:  Antibiotics Given (last 72 hours)    Date/Time Action Medication Dose Rate   07/29/19 1505 New Bag/Given   piperacillin-tazobactam (ZOSYN) IVPB 3.375 g 3.375 g 12.5 mL/hr   07/29/19 2353 New Bag/Given   piperacillin-tazobactam (ZOSYN) IVPB 3.375 g 3.375 g 12.5 mL/hr   07/30/19 0835 New Bag/Given   piperacillin-tazobactam (ZOSYN) IVPB 3.375 g 3.375 g 12.5 mL/hr   07/30/19 1748 New Bag/Given   piperacillin-tazobactam (ZOSYN) IVPB 3.375 g 3.375 g 12.5 mL/hr   07/31/19 0016 New Bag/Given   piperacillin-tazobactam (ZOSYN) IVPB 3.375 g 3.375 g 12.5 mL/hr   07/31/19 0826 New Bag/Given   piperacillin-tazobactam (ZOSYN) IVPB 3.375 g 3.375 g 12.5 mL/hr   07/31/19 1628 New Bag/Given   piperacillin-tazobactam (ZOSYN) IVPB 3.375 g 3.375 g 12.5 mL/hr   08/01/19 0014 New Bag/Given   piperacillin-tazobactam (ZOSYN) IVPB 3.375 g 3.375 g 12.5 mL/hr   08/01/19 [id number] New Bag/Given   piperacillin-tazobactam (ZOSYN) IVPB 3.375 g 3.375 g 12.5 mL/hr     . Chlorhexidine Gluconate Cloth  6 each Topical Daily  . enoxaparin (LOVENOX) injection  40 mg Subcutaneous Q24H  . escitalopram  20 mg Oral Daily  . furosemide  20 mg Intravenous BID  . gabapentin  300 mg Oral QHS  . insulin aspart  0-9 Units Subcutaneous Q6H  . ketorolac  15 mg Intravenous Q6H  . levothyroxine  125 mcg Oral QAC breakfast  . pantoprazole (PROTONIX) IV  40 mg Intravenous QHS  . scopolamine  1 patch Transdermal Q72H  . sodium chloride  flush  10-40 mL Intracatheter Q12H  . sodium chloride flush  5 mL Intracatheter Q8H    Objective: Vital signs in last 24 hours: Temp:  [98.2 F (36.8 C)-98.4 F (36.9 C)] 98.4 F (36.9 C) (03/18 0419) Pulse Rate:  [98-102] 102 (03/18 0419) Resp:  [18] 18 (03/18 0419) BP: (140-145)/(61-70) 145/61 (03/18 0419) SpO2:  [97 %-99 %] 97 % (03/18 0419) Weight:  [126.4 kg] 126.4 kg (03/18 0500) Constitutional:  oriented to person, place, and time. appears well-developed and well-nourished. No distress.  HENT: [place]/AT, PERRLA, no scleral icterus Mouth/Throat: Oropharynx is clear and moist. No oropharyngeal exudate.  Cardiovascular: Normal rate, regular rhythm and normal heart sounds. Exam reveals no gallop and no friction rub.  No murmur heard.  Pulmonary/Chest: Effort normal and breath sounds normal. No respiratory distress.  has no wheezes.  Neck = supple, no nuchal rigidity Abdominal: Soft. Midline incision with mild dehisence, mild ss drainage, min redness and no purulence.  Perc drain in place with SS drainage. Lymphadenopathy: no cervical adenopathy. No axillary adenopathy Neurological: alert and oriented to person, place, and time.  Skin: Skin is warm and dry. No rash noted. No erythema.  R arm picc line in place. Psychiatric: a normal mood and affect.  behavior is normal.   Lab Results Recent Labs    07/31/19 0614 08/01/19 0535  WBC 23.7* 19.5*  HGB 7.0* 7.1*  HCT  21.4* 22.0*  NA 135 140  K 2.9* 3.0*  CL 101 106  CO2 26 30  BUN <5* <5*  CREATININE 0.40* 0.40*    Microbiology: Results for orders placed or performed during the hospital encounter of 07/22/19  Urine Culture     Status: None   Collection Time: 07/23/19  4:00 PM   Specimen: Urine, Catheterized  Result Value Ref Range Status   Specimen Description   Final    URINE, CATHETERIZED Performed at [facility], [address] [place], [place] [address]    Special Requests   Final    NONE Performed at  [facility], [address], [place], [place] [address]    Culture   Final    NO GROWTH Performed at [facility], [place] [address], [place], [place] [address]    Report Status 07/24/2019 FINAL  Final  Aerobic/Anaerobic Culture (surgical/deep wound)     Status: None   Collection Time: 07/26/19  4:42 PM   Specimen: PATH Other; Tissue  Result Value Ref Range Status   Specimen Description   Final    FLUID ABDOMEN RLQ Performed at [facility], [address], [place], [place] [address]    Special Requests   Final    NONE Performed at [facility], [address] [place], [place] [address]    Gram Stain   Final    FEW WBC PRESENT, PREDOMINANTLY PMN NO ORGANISMS SEEN    Culture   Final    No growth aerobically or anaerobically. Performed at [facility], [place] [address] [place], [place] [address]    Report Status 08/01/2019 FINAL  Final  Urine Culture     Status: None   Collection Time: 07/28/19 12:03 AM   Specimen: PATH Other; Urine  Result Value Ref Range Status   Specimen Description   Final    URINE, RANDOM Performed at [facility], [address], [place], [place] [address]    Special Requests   Final    NONE Performed at [facility], [address], [place], [place] [address]    Culture   Final    NO GROWTH Performed at [facility], [place] [address] [place],  [address]    Report Status 07/29/2019 FINAL  Final  Culture, blood (routine x 2)     Status: None (Preliminary result)   Collection Time: 07/30/19  6:02 PM   Specimen: BLOOD  Result Value Ref Range Status   Specimen Description BLOOD ALIN  Final   Special Requests   Final    BOTTLES DRAWN AEROBIC AND ANAEROBIC Blood Culture results may not be optimal due to an excessive volume of blood received in culture bottles   Culture   Final    NO GROWTH 2 DAYS Performed at [facility], [address], [place],  [address]     Report Status PENDING  Incomplete  Culture, blood (routine x 2)     Status: None (Preliminary result)   Collection Time: 07/30/19  7:02 PM   Specimen: BLOOD  Result Value Ref Range Status   Specimen Description BLOOD PORTA CATH  Final   Special Requests   Final    BOTTLES DRAWN AEROBIC AND ANAEROBIC Blood Culture results may not be optimal due to an excessive volume of blood received in culture bottles   Culture   Final    NO GROWTH 2 DAYS Performed at [facility], [address] [place], [place] [address]    Report Status PENDING  Incomplete  C difficile quick scan w PCR reflex     Status: None   Collection Time: 07/31/19  4:38 PM   Specimen: STOOL  Result Value Ref Range Status   C Diff antigen NEGATIVE NEGATIVE Final   C Diff toxin NEGATIVE NEGATIVE Final   C Diff interpretation No C. difficile detected.  Final    Comment: Performed at [facility], [address] [place], [place] [address]  Aerobic Culture (superficial specimen)     Status: None (Preliminary result)   Collection Time: 07/31/19 10:18 PM   Specimen: JP Drain; Abdominal Fluid  Result Value Ref Range Status   Specimen Description   Final    JP DRAINAGE Performed at [facility], [address], [place], [place] [address]    Special Requests   Final    Normal Performed at [facility], [address] [place], [place] [address]    Gram Stain   Final    FEW WBC PRESENT,BOTH PMN AND MONONUCLEAR NO ORGANISMS SEEN Performed at [facility], [place] [address] [place], [place] [address]    Culture PENDING  Incomplete   Report Status PENDING  Incomplete    Studies/Results: CT ABDOMEN PELVIS W CONTRAST  Result Date: 07/31/2019 CLINICAL DATA:  44 yo F s/p exp. Lap and repair of small bowel enterotomy as well as hysterectomy with OB/GYN and surgery involved. Reexploration abdominal washout and resection of original anastomosis today 3/14. EXAM: CT ABDOMEN AND PELVIS  WITH CONTRAST TECHNIQUE: Multidetector CT imaging of the abdomen and pelvis was performed using the standard protocol following bolus administration of intravenous contrast. CONTRAST:  111mL OMNIPAQUE IOHEXOL 300 MG/ML  SOLN COMPARISON:  07/26/2019 FINDINGS: Lower chest: Dependent atelectasis, greater on the right, similar to the prior CT. No acute findings. Hepatobiliary: No focal liver abnormality is seen. Status post cholecystectomy. No biliary dilatation. Pancreas: Unremarkable. No pancreatic ductal dilatation or surrounding inflammatory changes. Spleen: Normal in size without focal abnormality. Adrenals/Urinary Tract: No adrenal masses. Stable low-density renal masses consistent with cysts on the left. Mild prominence of the right intrarenal collecting system. No renal or ureteral stones. No left renal collecting system dilation. Normal left ureter. Symmetric renal enhancement and excretion. Bladder minimally distended. Foley catheter in place. No bladder mass. Stomach/Bowel: Normal stomach. Mesenteric edema and inflammation most evident in the central to lower abdomen. Right lower quadrant percutaneously placed drainage catheter significantly decreases the size of the previously seen right lower quadrant collection. Residual collection measures 6.4 x 2.9 cm transversely. Small anterior less well-defined fluid collection is seen, to the right of midline, near the level of the umbilicus, 6.3 x 1.9 cm transversely. Fluid is noted within leaves of the small bowel mesentery most evident mid to lower abdomen, without additional defined collection. Bowel anastomosis staples are noted upper anterior pelvis extending to the right lower quadrant. No evidence of bowel obstruction. Several loops of central to lower abdomen small bowel demonstrate mild wall thickening. Colon is mostly decompressed. No colonic wall thickening. Vascular/Lymphatic: No vascular abnormality. Several prominent mesenteric lymph nodes most evident  right lower quadrant, all subcentimeter and similar to the prior CT. Reproductive: Status post hysterectomy. No adnexal masses. Other: Anterior abdominal wall air adjacent to the midline incision. No hernia. No abdominal wall collection to suggest abscess. Small amount of ascites. Tiny bubbles of free intraperitoneal air are noted deep to the midline incision. Musculoskeletal: No acute or significant  abnormality. IMPRESSION: 1. No bowel obstruction. Mild small bowel wall thickening in the lower abdomen is likely reactive/edema from the recent surgery. 2. Significant improvement the right lower quadrant collection following placement of a percutaneous drainage catheter. 3. No new abdominal collections. Small amount of ascites and diffuse mesenteric edema most evident mid to lower abdomen. 4. Few tiny bubbles of anterior midline free air, presumed postsurgical. Electronically Signed   By: Lajean Manes M.D.   On: 07/31/2019 10:48    Assessment/Plan: Karen Aguirre is a 44 y.o. female with complicated surgical history since 3/8 TAH complicated by SB perf and repair, with anastomoses leak and repeat surgery 3/14. Has persistent leukocytosis despite zosyn. No fevers. Possible sources include line infection, wound infection, intraabdominal source, TPN associated fungal infection, UTI.  3/18 feels better, wbc down some. Up and walking Recommendations BCX pending Cont zosyn If wbc persists or develops fevers wound add micafungin given intrabd abscess. We have sent cx from  drain to look for resistant organisms not covered by Zosyn - VRE, ESBL. Thank you very much for the consult. Will follow with you.  Leonel Ramsay   08/01/2019, 2:21 PM

## 2019-08-01 NOTE — Progress Notes (Signed)
Subjective:  CC: Karen Aguirre is a 44 y.o. female  Hospital stay day 10,  exploratory laparotomy and repair of small bowel enterotomy as well as hysterectomy with OB/GYN  5 Days Post-Op  Reexploration abdominal washout and resection of original anastomosis   HPI: No acute events postop.  Continues to have watery stool, pain improving  ROS:  General: Denies weight loss, weight gain, fatigue, fevers, chills, and night sweats. Heart: Denies chest pain, palpitations, racing heart, irregular heartbeat, leg pain or swelling, and decreased activity tolerance. Respiratory: Denies breathing difficulty, shortness of breath, wheezing, cough, and sputum. GI: Denies change in appetite, heartburn, constipation, diarrhea, and blood in stool. GU: Denies difficulty urinating, pain with urinating, urgency, frequency, blood in urine.   Objective:   Temp:  [98.2 F (36.8 C)-98.4 F (36.9 C)] 98.4 F (36.9 C) (03/18 0419) Pulse Rate:  [95-102] 102 (03/18 0419) Resp:  [18-20] 18 (03/18 0419) BP: (132-145)/(61-73) 145/61 (03/18 0419) SpO2:  [97 %-99 %] 97 % (03/18 0419) Weight:  [126.4 kg] 126.4 kg (03/18 0500)     Height: 5\' 2"  (157.5 cm) Weight: 126.4 kg BMI (Calculated): 50.95   Intake/Output this shift:   Intake/Output Summary (Last 24 hours) at 08/01/2019 0900 Last data filed at 08/01/2019 0600 Gross per 24 hour  Intake 1953.97 ml  Output 650 ml  Net 1303.97 ml  200 mL of bilious output from IR drain  Constitutional :  alert, cooperative, appears stated age and mild distress  Respiratory:  clear to auscultation bilaterally  Cardiovascular:  regular rate and rhythm  Gastrointestinal: Soft, minor TTP around incision, no TTP elsewhere. nonindurated, no erythema, and increasing serosanguinous discharge from midline.  Port sites remain clean dry and intact.  IR drain continues to be serous.   Skin: Cool and moist.   Psychiatric: Normal affect, non-agitated, not confused       LABS:  CMP  Latest Ref Rng & Units 08/01/2019 07/31/2019 07/30/2019  Glucose 70 - 99 mg/dL 118(H) 138(H) -  BUN 6 - 20 mg/dL <5(L) <5(L) -  Creatinine 0.44 - 1.00 mg/dL 0.40(L) 0.40(L) -  Sodium 135 - 145 mmol/L 140 135 -  Potassium 3.5 - 5.1 mmol/L 3.0(L) 2.9(L) 3.1(L)  Chloride 98 - 111 mmol/L 106 101 -  CO2 22 - 32 mmol/L 30 26 -  Calcium 8.9 - 10.3 mg/dL 7.6(L) 7.1(L) -  Total Protein 6.5 - 8.1 g/dL 6.0(L) - -  Total Bilirubin 0.3 - 1.2 mg/dL 0.6 - -  Alkaline Phos 38 - 126 U/L 64 - -  AST 15 - 41 U/L 34 - -  ALT 0 - 44 U/L 33 - -   CBC Latest Ref Rng & Units 08/01/2019 07/31/2019 07/30/2019  WBC 4.0 - 10.5 K/uL 19.5(H) 23.7(H) 20.4(H)  Hemoglobin 12.0 - 15.0 g/dL 7.1(L) 7.0(L) 6.9(L)  Hematocrit 36.0 - 46.0 % 22.0(L) 21.4(L) 21.4(L)  Platelets 150 - 400 K/uL 606(H) 530(H) 452(H)    RADS: CLINICAL DATA:  44 yo F s/p exp. Lap and repair of small bowel enterotomy as well as hysterectomy with OB/GYN and surgery involved. Reexploration abdominal washout and resection of original anastomosis today 3/14.  EXAM: CT ABDOMEN AND PELVIS WITH CONTRAST  TECHNIQUE: Multidetector CT imaging of the abdomen and pelvis was performed using the standard protocol following bolus administration of intravenous contrast.  CONTRAST:  142mL OMNIPAQUE IOHEXOL 300 MG/ML  SOLN  COMPARISON:  07/26/2019  FINDINGS: Lower chest: Dependent atelectasis, greater on the right, similar to the prior CT. No acute  findings.  Hepatobiliary: No focal liver abnormality is seen. Status post cholecystectomy. No biliary dilatation.  Pancreas: Unremarkable. No pancreatic ductal dilatation or surrounding inflammatory changes.  Spleen: Normal in size without focal abnormality.  Adrenals/Urinary Tract: No adrenal masses. Stable low-density renal masses consistent with cysts on the left. Mild prominence of the right intrarenal collecting system. No renal or ureteral stones. No left renal collecting system dilation.  Normal left ureter. Symmetric renal enhancement and excretion. Bladder minimally distended. Foley catheter in place. No bladder mass.  Stomach/Bowel: Normal stomach.  Mesenteric edema and inflammation most evident in the central to lower abdomen. Right lower quadrant percutaneously placed drainage catheter significantly decreases the size of the previously seen right lower quadrant collection. Residual collection measures 6.4 x 2.9 cm transversely. Small anterior less well-defined fluid collection is seen, to the right of midline, near the level of the umbilicus, 6.3 x 1.9 cm transversely. Fluid is noted within leaves of the small bowel mesentery most evident mid to lower abdomen, without additional defined collection. Bowel anastomosis staples are noted upper anterior pelvis extending to the right lower quadrant.  No evidence of bowel obstruction. Several loops of central to lower abdomen small bowel demonstrate mild wall thickening. Colon is mostly decompressed. No colonic wall thickening.  Vascular/Lymphatic: No vascular abnormality.  Several prominent mesenteric lymph nodes most evident right lower quadrant, all subcentimeter and similar to the prior CT.  Reproductive: Status post hysterectomy. No adnexal masses.  Other: Anterior abdominal wall air adjacent to the midline incision. No hernia. No abdominal wall collection to suggest abscess. Small amount of ascites. Tiny bubbles of free intraperitoneal air are noted deep to the midline incision.  Musculoskeletal: No acute or significant abnormality.  IMPRESSION: 1. No bowel obstruction. Mild small bowel wall thickening in the lower abdomen is likely reactive/edema from the recent surgery. 2. Significant improvement the right lower quadrant collection following placement of a percutaneous drainage catheter. 3. No new abdominal collections. Small amount of ascites and diffuse mesenteric edema most evident mid to  lower abdomen. 4. Few tiny bubbles of anterior midline free air, presumed postsurgical.  Assessment:   exploratory laparotomy and repair of small bowel enterotomy as well as hysterectomy with OB/GYN  Reexploration abdominal washout and resection of original anastomosis.  Leukocytosis finally decreasing.  C. difficile negative, Blood, urine, fluid cultures negative to date  hemoglobin stable.  Resume Lovenox in a.m.   Adequate urine output. But will continue to diuresis  PICC in place.  Continue TPN for now.  Ideally would like to see drop in leukocytosis one more day and decreasing nausea vomiting prior to potentially starting to feed.  Hopefully tomorrow?

## 2019-08-02 ENCOUNTER — Inpatient Hospital Stay: Payer: Managed Care, Other (non HMO)

## 2019-08-02 LAB — BASIC METABOLIC PANEL
Anion gap: 10 (ref 5–15)
BUN: 6 mg/dL (ref 6–20)
CO2: 23 mmol/L (ref 22–32)
Calcium: 7.8 mg/dL — ABNORMAL LOW (ref 8.9–10.3)
Chloride: 105 mmol/L (ref 98–111)
Creatinine, Ser: 0.44 mg/dL (ref 0.44–1.00)
GFR calc Af Amer: >60 mL/min (ref >60)
GFR calc non Af Amer: >60 mL/min (ref >60)
Glucose, Bld: 156 mg/dL — ABNORMAL HIGH (ref 70–99)
Potassium: 3.4 mmol/L — ABNORMAL LOW (ref 3.5–5.1)
Sodium: 138 mmol/L (ref 135–145)

## 2019-08-02 LAB — CBC WITH DIFFERENTIAL/PLATELET
Abs Immature Granulocytes: 1.9 10*3/uL — ABNORMAL HIGH (ref 0.00–0.07)
Basophils Absolute: 0.1 10*3/uL (ref 0.0–0.1)
Basophils Relative: 0 %
Eosinophils Absolute: 0.4 10*3/uL (ref 0.0–0.5)
Eosinophils Relative: 2 %
HCT: 22.7 % — ABNORMAL LOW (ref 36.0–46.0)
Hemoglobin: 7.2 g/dL — ABNORMAL LOW (ref 12.0–15.0)
Immature Granulocytes: 8 %
Lymphocytes Relative: 10 %
Lymphs Abs: 2.4 10*3/uL (ref 0.7–4.0)
MCH: 29.5 pg (ref 26.0–34.0)
MCHC: 31.7 g/dL (ref 30.0–36.0)
MCV: 93 fL (ref 80.0–100.0)
Monocytes Absolute: 1.3 10*3/uL — ABNORMAL HIGH (ref 0.1–1.0)
Monocytes Relative: 6 %
Neutro Abs: 16.8 10*3/uL — ABNORMAL HIGH (ref 1.7–7.7)
Neutrophils Relative %: 74 %
Platelets: 681 10*3/uL — ABNORMAL HIGH (ref 150–400)
RBC: 2.44 MIL/uL — ABNORMAL LOW (ref 3.87–5.11)
RDW: 14.4 % (ref 11.5–15.5)
Smear Review: NORMAL
WBC: 22.9 10*3/uL — ABNORMAL HIGH (ref 4.0–10.5)
nRBC: 0.8 % — ABNORMAL HIGH (ref 0.0–0.2)

## 2019-08-02 LAB — GLUCOSE, CAPILLARY
Glucose-Capillary: 150 mg/dL — ABNORMAL HIGH (ref 70–99)
Glucose-Capillary: 142 mg/dL — ABNORMAL HIGH (ref 70–99)
Glucose-Capillary: 122 mg/dL — ABNORMAL HIGH (ref 70–99)
Glucose-Capillary: 120 mg/dL — ABNORMAL HIGH (ref 70–99)

## 2019-08-02 LAB — PHOSPHORUS: Phosphorus: 2.6 mg/dL (ref 2.5–4.6)

## 2019-08-02 LAB — MAGNESIUM: Magnesium: 2.1 mg/dL (ref 1.7–2.4)

## 2019-08-02 MED ORDER — SODIUM CHLORIDE 0.9 % IV SOLN
100.0000 mg | INTRAVENOUS | Status: AC
  Administered 2019-08-03 – 2019-08-15 (×13): 100 mg via INTRAVENOUS
  Filled 2019-08-02 (×13): qty 100

## 2019-08-02 MED ORDER — LEVOTHYROXINE SODIUM 100 MCG/5ML IV SOLN
62.5000 ug | Freq: Every day | INTRAVENOUS | Status: DC
  Administered 2019-08-02 – 2019-08-11 (×10): 62.5 ug via INTRAVENOUS
  Filled 2019-08-02 (×11): qty 5

## 2019-08-02 MED ORDER — FENTANYL CITRATE (PF) 100 MCG/2ML IJ SOLN
INTRAMUSCULAR | Status: AC | PRN
  Administered 2019-08-02: 25 ug via INTRAVENOUS

## 2019-08-02 MED ORDER — METOCLOPRAMIDE HCL 5 MG/ML IJ SOLN
5.0000 mg | Freq: Once | INTRAMUSCULAR | Status: AC
  Administered 2019-08-02: 13:00:00 5 mg via INTRAVENOUS
  Filled 2019-08-02: qty 2

## 2019-08-02 MED ORDER — FENTANYL CITRATE (PF) 100 MCG/2ML IJ SOLN
INTRAMUSCULAR | Status: AC
  Filled 2019-08-02: qty 2

## 2019-08-02 MED ORDER — POTASSIUM CHLORIDE 10 MEQ/100ML IV SOLN: 10.0000 meq | INTRAVENOUS | Status: AC

## 2019-08-02 MED ORDER — FAT EMULSION PLANT BASED 20 % IV EMUL
360.0000 mL | INTRAVENOUS | Status: AC
  Administered 2019-08-02: 19:00:00 360 mL via INTRAVENOUS
  Filled 2019-08-02: qty 360

## 2019-08-02 MED ORDER — POTASSIUM CHLORIDE 10 MEQ/100ML IV SOLN
10.0000 meq | INTRAVENOUS | Status: DC
  Filled 2019-08-02: qty 100

## 2019-08-02 MED ORDER — TRACE MINERALS CU-MN-SE-ZN 300-55-60-3000 MCG/ML IV SOLN
INTRAVENOUS | Status: AC
  Filled 2019-08-02 (×2): qty 1992

## 2019-08-02 MED ORDER — SODIUM CHLORIDE 0.9 % IV SOLN
200.0000 mg | Freq: Once | INTRAVENOUS | Status: AC
  Administered 2019-08-02: 200 mg via INTRAVENOUS
  Filled 2019-08-02: qty 200

## 2019-08-02 NOTE — Sedation Documentation (Signed)
report called to [facility]. Pt received 25 mcg Fentanyl IV. VS stable

## 2019-08-02 NOTE — Procedures (Signed)
Interventional Radiology Procedure:   Indications: Small amount of post operative abdominal fluid throughout abdomen.  Request for fluid sampling.  Procedure: US guided abdominal fluid aspiration  Findings: Small amount of fluid in right anterior abdomen, similar to recent CT.  US guided aspiration was technically difficult due to small amount of fluid.  Only 3 ml of red serous could be aspirated.  Fluid appears to be complex and may request blood products and some hematoma.   Complications: None     EBL: less than 10 ml  Plan: Return to inpatient floor.    Bernon Arviso R. Anselm Pancoast, MD  Pager: [phone]

## 2019-08-02 NOTE — Progress Notes (Signed)
Administered patient medications, changed bed linens. Bath was offered and declined.

## 2019-08-02 NOTE — Progress Notes (Signed)
Suezanne Jacquet, RN attempted to reach out to patient this morning for rounding but patient was occupied and unable to take the call. Leadership attempted to reach out again and this nurse was told that she did not want to talk to anyone at that time. A third attempt was made this afternoon and she was again told to call back later. Contact information was offered to the patient and refused.

## 2019-08-02 NOTE — Progress Notes (Signed)
[place] INFECTIOUS DISEASE PROGRESS NOTE Date of Admission:  07/22/2019     ID: RASHAE FERDERER is a 44 y.o. female with  leukocytosis Active Problems:   Fibroid uterus   Pelvic pain   S/P laparotomy   Subjective: WBC up but no fevers. Had a bad night with nausea  ROS  Eleven systems are reviewed and negative except per hpi  Medications:  Antibiotics Given (last 72 hours)    Date/Time Action Medication Dose Rate   07/30/19 0835 New Bag/Given   piperacillin-tazobactam (ZOSYN) IVPB 3.375 g 3.375 g 12.5 mL/hr   07/30/19 1748 New Bag/Given   piperacillin-tazobactam (ZOSYN) IVPB 3.375 g 3.375 g 12.5 mL/hr   07/31/19 0016 New Bag/Given   piperacillin-tazobactam (ZOSYN) IVPB 3.375 g 3.375 g 12.5 mL/hr   07/31/19 0826 New Bag/Given   piperacillin-tazobactam (ZOSYN) IVPB 3.375 g 3.375 g 12.5 mL/hr   07/31/19 1628 New Bag/Given   piperacillin-tazobactam (ZOSYN) IVPB 3.375 g 3.375 g 12.5 mL/hr   08/01/19 0014 New Bag/Given   piperacillin-tazobactam (ZOSYN) IVPB 3.375 g 3.375 g 12.5 mL/hr   08/01/19 [id number] New Bag/Given   piperacillin-tazobactam (ZOSYN) IVPB 3.375 g 3.375 g 12.5 mL/hr   08/01/19 1643 New Bag/Given   piperacillin-tazobactam (ZOSYN) IVPB 3.375 g 3.375 g 12.5 mL/hr   08/02/19 0055 New Bag/Given   piperacillin-tazobactam (ZOSYN) IVPB 3.375 g 3.375 g 12.5 mL/hr     . Chlorhexidine Gluconate Cloth  6 each Topical Daily  . enoxaparin (LOVENOX) injection  40 mg Subcutaneous Q24H  . escitalopram  20 mg Oral Daily  . furosemide  20 mg Intravenous BID  . gabapentin  300 mg Oral QHS  . insulin aspart  0-9 Units Subcutaneous Q6H  . ketorolac  15 mg Intravenous Q6H  . levothyroxine  125 mcg Oral QAC breakfast  . pantoprazole (PROTONIX) IV  40 mg Intravenous QHS  . scopolamine  1 patch Transdermal Q72H  . sodium chloride flush  10-40 mL Intracatheter Q12H  . sodium chloride flush  5 mL Intracatheter Q8H    Objective: Vital signs in last 24 hours: Temp:  [98.6 F (37  C)-98.7 F (37.1 C)] 98.7 F (37.1 C) (03/19 0315) Pulse Rate:  [102-104] 104 (03/19 0315) Resp:  [18-20] 20 (03/19 0315) BP: (142-143)/(61-68) 142/61 (03/19 0315) SpO2:  [96 %-98 %] 96 % (03/19 0315) Weight:  [130.7 kg] 130.7 kg (03/19 0315) Constitutional:  oriented to person, place, and time. appears well-developed and well-nourished. No distress.  HENT: [place]/AT, PERRLA, no scleral icterus Mouth/Throat: Oropharynx is clear and moist. No oropharyngeal exudate.  Cardiovascular: Normal rate, regular rhythm and normal heart sounds. Exam reveals no gallop and no friction rub.  No murmur heard.  Pulmonary/Chest: Effort normal and breath sounds normal. No respiratory distress.  has no wheezes.  Neck = supple, no nuchal rigidity Abdominal: Soft. Midline incision with mild dehisence, mild ss drainage, min redness and no purulence.  Perc drain in place with SS drainage. Lymphadenopathy: no cervical adenopathy. No axillary adenopathy Neurological: alert and oriented to person, place, and time.  Skin: Skin is warm and dry. No rash noted. No erythema.  R arm picc line in place. Psychiatric: a normal mood and affect.  behavior is normal.   Lab Results Recent Labs    08/01/19 0535 08/02/19 0547  WBC 19.5* 22.9*  HGB 7.1* 7.2*  HCT 22.0* 22.7*  NA 140 138  K 3.0* 3.4*  CL 106 105  CO2 30 23  BUN <5* 6  CREATININE 0.40* 0.44  Microbiology: Results for orders placed or performed during the hospital encounter of 07/22/19  Urine Culture     Status: None   Collection Time: 07/23/19  4:00 PM   Specimen: Urine, Catheterized  Result Value Ref Range Status   Specimen Description   Final    URINE, CATHETERIZED Performed at [facility], [address] [place], [place] [address]    Special Requests   Final    NONE Performed at [facility], [address] [place], [place] [address]    Culture   Final    NO GROWTH Performed at [facility], [place] [address], [place], [place] [address]    Report Status 07/24/2019 FINAL  Final  Aerobic/Anaerobic Culture (surgical/deep wound)     Status: None   Collection Time: 07/26/19  4:42 PM   Specimen: PATH Other; Tissue  Result Value Ref Range Status   Specimen Description   Final    FLUID ABDOMEN RLQ Performed at [facility], [address] [place], [place] [address]    Special Requests   Final    NONE Performed at [facility], [address] [place], [place] [address]    Gram Stain   Final    FEW WBC PRESENT, PREDOMINANTLY PMN NO ORGANISMS SEEN    Culture   Final    No growth aerobically or anaerobically. Performed at [facility], [place] [address] [place], [place] [address]    Report Status 08/01/2019 FINAL  Final  Urine Culture     Status: None   Collection Time: 07/28/19 12:03 AM   Specimen: PATH Other; Urine  Result Value Ref Range Status   Specimen Description   Final    URINE, RANDOM Performed at [facility], [address], [place], [place] [address]    Special Requests   Final    NONE Performed at [facility], [address], [place], [place] [address]    Culture   Final    NO GROWTH Performed at [facility], [place] [address], [place], [place] [address]    Report Status 07/29/2019 FINAL  Final  Culture, blood (routine x 2)     Status: None (Preliminary result)   Collection Time: 07/30/19  6:02 PM   Specimen: BLOOD  Result Value Ref Range Status   Specimen Description BLOOD ALIN  Final   Special Requests   Final    BOTTLES DRAWN AEROBIC AND ANAEROBIC Blood Culture results may not be optimal due to an excessive volume of blood received in culture bottles   Culture   Final    NO GROWTH 3 DAYS Performed at [facility], [address], [place], [place] [address]    Report Status PENDING  Incomplete  Culture, blood (routine x 2)     Status: None (Preliminary result)   Collection Time: 07/30/19  7:02 PM    Specimen: BLOOD  Result Value Ref Range Status   Specimen Description BLOOD PORTA CATH  Final   Special Requests   Final    BOTTLES DRAWN AEROBIC AND ANAEROBIC Blood Culture results may not be optimal due to an excessive volume of blood received in culture bottles   Culture   Final    NO GROWTH 3 DAYS Performed at [facility], [address], [place], [place] [address]    Report Status PENDING  Incomplete  C difficile quick scan w PCR reflex     Status: None  Collection Time: 07/31/19  4:38 PM   Specimen: STOOL  Result Value Ref Range Status   C Diff antigen NEGATIVE NEGATIVE Final   C Diff toxin NEGATIVE NEGATIVE Final   C Diff interpretation No C. difficile detected.  Final    Comment: Performed at [facility], [address] [place], [place] [address]  Aerobic Culture (superficial specimen)     Status: None (Preliminary result)   Collection Time: 07/31/19 10:18 PM   Specimen: JP Drain; Abdominal Fluid  Result Value Ref Range Status   Specimen Description   Final    JP DRAINAGE Performed at [facility], [address] [place], [place] [address]    Special Requests   Final    Normal Performed at [facility], [place] [place], [place] [address]    Gram Stain   Final    FEW WBC PRESENT,BOTH PMN AND MONONUCLEAR NO ORGANISMS SEEN Performed at [facility], [place] [address] [place], [place] [address]    Culture PENDING  Incomplete   Report Status PENDING  Incomplete    Studies/Results: CT ABDOMEN PELVIS W CONTRAST  Result Date: 07/31/2019 CLINICAL DATA:  44 yo F s/p exp. Lap and repair of small bowel enterotomy as well as hysterectomy with OB/GYN and surgery involved. Reexploration abdominal washout and resection of original anastomosis today 3/14. EXAM: CT ABDOMEN AND PELVIS WITH CONTRAST TECHNIQUE: Multidetector CT imaging of the abdomen and pelvis was performed using the standard protocol following bolus  administration of intravenous contrast. CONTRAST:  133mL OMNIPAQUE IOHEXOL 300 MG/ML  SOLN COMPARISON:  07/26/2019 FINDINGS: Lower chest: Dependent atelectasis, greater on the right, similar to the prior CT. No acute findings. Hepatobiliary: No focal liver abnormality is seen. Status post cholecystectomy. No biliary dilatation. Pancreas: Unremarkable. No pancreatic ductal dilatation or surrounding inflammatory changes. Spleen: Normal in size without focal abnormality. Adrenals/Urinary Tract: No adrenal masses. Stable low-density renal masses consistent with cysts on the left. Mild prominence of the right intrarenal collecting system. No renal or ureteral stones. No left renal collecting system dilation. Normal left ureter. Symmetric renal enhancement and excretion. Bladder minimally distended. Foley catheter in place. No bladder mass. Stomach/Bowel: Normal stomach. Mesenteric edema and inflammation most evident in the central to lower abdomen. Right lower quadrant percutaneously placed drainage catheter significantly decreases the size of the previously seen right lower quadrant collection. Residual collection measures 6.4 x 2.9 cm transversely. Small anterior less well-defined fluid collection is seen, to the right of midline, near the level of the umbilicus, 6.3 x 1.9 cm transversely. Fluid is noted within leaves of the small bowel mesentery most evident mid to lower abdomen, without additional defined collection. Bowel anastomosis staples are noted upper anterior pelvis extending to the right lower quadrant. No evidence of bowel obstruction. Several loops of central to lower abdomen small bowel demonstrate mild wall thickening. Colon is mostly decompressed. No colonic wall thickening. Vascular/Lymphatic: No vascular abnormality. Several prominent mesenteric lymph nodes most evident right lower quadrant, all subcentimeter and similar to the prior CT. Reproductive: Status post hysterectomy. No adnexal masses.  Other: Anterior abdominal wall air adjacent to the midline incision. No hernia. No abdominal wall collection to suggest abscess. Small amount of ascites. Tiny bubbles of free intraperitoneal air are noted deep to the midline incision. Musculoskeletal: No acute or significant abnormality. IMPRESSION: 1. No bowel obstruction. Mild small bowel wall thickening in the lower abdomen is likely reactive/edema from the recent surgery. 2. Significant improvement the right lower quadrant  collection following placement of a percutaneous drainage catheter. 3. No new abdominal collections. Small amount of ascites and diffuse mesenteric edema most evident mid to lower abdomen. 4. Few tiny bubbles of anterior midline free air, presumed postsurgical. Electronically Signed   By: Lajean Manes M.D.   On: 07/31/2019 10:48    Assessment/Plan: TATASHA MI is a 44 y.o. female with complicated surgical history since 3/8 TAH complicated by SB perf and repair, with anastomoses leak and repeat surgery 3/14. Has persistent leukocytosis despite zosyn. No fevers. Possible sources include line infection, wound infection, intraabdominal source, TPN associated fungal infection, UTI.  3/18 feels better, wbc down some. Up and walking 3/19 - wbc up some, nausea Recommendations BCX NGTD  Cont zosyn Will start antifungal given intrabd abscess. We have sent cx from  drain to look for resistant organisms not covered by Zosyn - VRE, ESBL. Thank you very much for the consult. Will follow with you.  Leonel Ramsay   08/02/2019, 8:24 AM

## 2019-08-02 NOTE — Progress Notes (Signed)
PHARMACY - TOTAL PARENTERAL NUTRITION CONSULT NOTE   Indication:  s/p laparoscopic converted to open total hysterectomy, left salpingectomy, cystoscopy and 25 cm small bowel resection (near distal ileum) secondary to bowel injury 3/8. Pt declined post operatively requiring another exploratory laparotomy, lysis of adhesions, abdominal wound washout, resection of previous anastomosis with ~20cm of small bowel removed and subsequent reanastomosis 3/14   Patient Measurements: Height: 5\' 2"  (157.5 cm) Weight: 288 lb 1.6 oz (130.7 kg) IBW/kg (Calculated) : 50.1 TPN AdjBW (KG): 70.9 Body mass index is 52.69 kg/m.  Assessment: 44 y.o. female  Hospital stay day 11,  exploratory laparotomy and repair of small bowel enterotomy as well as hysterectomy with OB/GYN 5 Days Post-Op re-exploration abdominal washout and resection of original anastomosis  Glucose / Insulin: BG 108-150, continue SSI q6h Electrolytes:   K 3.4 Renal: Scr <1, stable LFTs / TGs:  Prealbumin / albumin:  Intake / Output; MIVF:  GI Imaging: Surgeries / Procedures:   Central access:  07/30/19 TPN start date: 07/30/19  Nutritional Goals per RD recommendation 3/16 Kcal:  2400-2700kcal/day Protein:  120-135g/day Goal TPN rate is 83 mL/hr (Regimen at goal rate will provide 2472kcal/day, 100g/day protein, 2352 ml volume )   Fluid: MIVF 0.9% saline stopped  Current Nutrition:  NPO  Plan:   Continue Clinimix 5/20 with electrolytes at goal rate of 83 ml/hr  Add MVI on Mondays and Thursdays, trace elements daily  Thiamine 100 mg daily x 3 days (3/16-3/18) completed  Continue 20% lipids @30ml /hr x 12 hrs/day   K 3.4 - Will order KCl 10 mEq IV x2 runs today  Continue sensitive SSI q6h and adjust as needed   Pt at high refeeding risk; check P, K, and Mg daily for 3 days after TPN iniatition.   Monitor TPN labs for 1st 3 days and then on Mon/Thurs, per protocol  Will recheck BMP in AM as K has been low  Rayna Sexton L 08/02/2019,10:12 AM

## 2019-08-02 NOTE — Progress Notes (Signed)
Subjective:  CC: Karen Aguirre is a 44 y.o. female  Hospital stay day 11,  exploratory laparotomy and repair of small bowel enterotomy as well as hysterectomy with OB/GYN  6 Days Post-Op  Reexploration abdominal washout and resection of original anastomosis   HPI:   No pain, episode of emesis again, but controlled with reglan now.  IR drainage done.  Pt continues to have soft BMs, slowed down in frequency.  ROS:  General: Denies weight loss, weight gain, fatigue, fevers, chills, and night sweats. Heart: Denies chest pain, palpitations, racing heart, irregular heartbeat, leg pain or swelling, and decreased activity tolerance. Respiratory: Denies breathing difficulty, shortness of breath, wheezing, cough, and sputum. GI: Denies change in appetite, heartburn, constipation, diarrhea, and blood in stool. GU: Denies difficulty urinating, pain with urinating, urgency, frequency, blood in urine.   Objective:   Temp:  [98.6 F (37 C)-98.8 F (37.1 C)] 98.8 F (37.1 C) (03/19 1741) Pulse Rate:  [95-104] 97 (03/19 1741) Resp:  [15-33] 15 (03/19 1741) BP: (132-154)/(61-86) 154/74 (03/19 1741) SpO2:  [96 %-100 %] 99 % (03/19 1741) Weight:  [130.7 kg] 130.7 kg (03/19 0315)     Height: 5\' 2"  (157.5 cm) Weight: 130.7 kg BMI (Calculated): 52.68   Intake/Output this shift:   Intake/Output Summary (Last 24 hours) at 08/02/2019 1826 Last data filed at 08/02/2019 1600 Gross per 24 hour  Intake 2140.84 ml  Output 1750 ml  Net 390.84 ml  30 mL of serous output from IR drain  Constitutional :  alert, cooperative, appears stated age and mild distress  Respiratory:  clear to auscultation bilaterally  Cardiovascular:  regular rate and rhythm  Gastrointestinal: Soft,  no TTP around incision anymore nonindurated, no erythema, and serosanguinous discharge from midline.  Port sites remain clean dry and intact.  IR drain continues to be serous.   Skin: Cool and moist.   Psychiatric: Normal affect,  non-agitated, not confused       LABS:  CMP Latest Ref Rng & Units 08/02/2019 08/01/2019 07/31/2019  Glucose 70 - 99 mg/dL 156(H) 118(H) 138(H)  BUN 6 - 20 mg/dL 6 <5(L) <5(L)  Creatinine 0.44 - 1.00 mg/dL 0.44 0.40(L) 0.40(L)  Sodium 135 - 145 mmol/L 138 140 135  Potassium 3.5 - 5.1 mmol/L 3.4(L) 3.0(L) 2.9(L)  Chloride 98 - 111 mmol/L 105 106 101  CO2 22 - 32 mmol/L 23 30 26   Calcium 8.9 - 10.3 mg/dL 7.8(L) 7.6(L) 7.1(L)  Total Protein 6.5 - 8.1 g/dL - 6.0(L) -  Total Bilirubin 0.3 - 1.2 mg/dL - 0.6 -  Alkaline Phos 38 - 126 U/L - 64 -  AST 15 - 41 U/L - 34 -  ALT 0 - 44 U/L - 33 -   CBC Latest Ref Rng & Units 08/02/2019 08/01/2019 07/31/2019  WBC 4.0 - 10.5 K/uL 22.9(H) 19.5(H) 23.7(H)  Hemoglobin 12.0 - 15.0 g/dL 7.2(L) 7.1(L) 7.0(L)  Hematocrit 36.0 - 46.0 % 22.7(L) 22.0(L) 21.4(L)  Platelets 150 - 400 K/uL 681(H) 606(H) 530(H)    RADS:  Assessment:   exploratory laparotomy and repair of small bowel enterotomy as well as hysterectomy with OB/GYN  Reexploration abdominal washout and resection of original anastomosis.  Leukocytosis increased today so asked IR to attempt drainage, only got scant amount of old hematoma per report.  C. difficile negative, Blood, urine, fluid cultures negative to date.  Antifungal added per ID recs.  hemoglobin stable.   Adequate urine output. But will continue to diuresis for 13L positive  PICC  in place.  Continue TPN for now. Pt at this point requesting to eat.  Clinical exam benign, slight tachy noted, but now completely pain free, serous discharge.  Unsure where nausea coming from because she continues to have BMs.  Will try a diet and see how she tolerates at this point.  She understands her clinical status can worsen significantly with PO trial, but is willing to take risk.

## 2019-08-03 LAB — CBC WITH DIFFERENTIAL/PLATELET
Abs Immature Granulocytes: 1.09 10*3/uL — ABNORMAL HIGH (ref 0.00–0.07)
Basophils Absolute: 0 10*3/uL (ref 0.0–0.1)
Basophils Relative: 0 %
Eosinophils Absolute: 0.5 10*3/uL (ref 0.0–0.5)
Eosinophils Relative: 3 %
HCT: 22.2 % — ABNORMAL LOW (ref 36.0–46.0)
Hemoglobin: 6.9 g/dL — ABNORMAL LOW (ref 12.0–15.0)
Immature Granulocytes: 7 %
Lymphocytes Relative: 15 %
Lymphs Abs: 2.3 10*3/uL (ref 0.7–4.0)
MCH: 29.4 pg (ref 26.0–34.0)
MCHC: 31.1 g/dL (ref 30.0–36.0)
MCV: 94.5 fL (ref 80.0–100.0)
Monocytes Absolute: 0.9 10*3/uL (ref 0.1–1.0)
Monocytes Relative: 6 %
Neutro Abs: 10.8 10*3/uL — ABNORMAL HIGH (ref 1.7–7.7)
Neutrophils Relative %: 69 %
Platelets: 801 10*3/uL — ABNORMAL HIGH (ref 150–400)
RBC: 2.35 MIL/uL — ABNORMAL LOW (ref 3.87–5.11)
RDW: 14.6 % (ref 11.5–15.5)
Smear Review: NORMAL
WBC: 15.6 10*3/uL — ABNORMAL HIGH (ref 4.0–10.5)
nRBC: 0.5 % — ABNORMAL HIGH (ref 0.0–0.2)

## 2019-08-03 LAB — BASIC METABOLIC PANEL
Anion gap: 8 (ref 5–15)
BUN: 9 mg/dL (ref 6–20)
CO2: 24 mmol/L (ref 22–32)
Calcium: 7.5 mg/dL — ABNORMAL LOW (ref 8.9–10.3)
Chloride: 104 mmol/L (ref 98–111)
Creatinine, Ser: 0.49 mg/dL (ref 0.44–1.00)
GFR calc Af Amer: >60 mL/min (ref >60)
GFR calc non Af Amer: >60 mL/min (ref >60)
Glucose, Bld: 137 mg/dL — ABNORMAL HIGH (ref 70–99)
Potassium: 3.5 mmol/L (ref 3.5–5.1)
Sodium: 136 mmol/L (ref 135–145)

## 2019-08-03 LAB — MAGNESIUM: Magnesium: 2.1 mg/dL (ref 1.7–2.4)

## 2019-08-03 LAB — GLUCOSE, CAPILLARY
Glucose-Capillary: 132 mg/dL — ABNORMAL HIGH (ref 70–99)
Glucose-Capillary: 127 mg/dL — ABNORMAL HIGH (ref 70–99)
Glucose-Capillary: 131 mg/dL — ABNORMAL HIGH (ref 70–99)

## 2019-08-03 LAB — PHOSPHORUS: Phosphorus: 2.8 mg/dL (ref 2.5–4.6)

## 2019-08-03 MED ORDER — RISAQUAD PO CAPS
1.0000 | ORAL_CAPSULE | Freq: Every day | ORAL | Status: DC
  Administered 2019-08-06 – 2019-08-15 (×4): 1 via ORAL
  Filled 2019-08-03 (×14): qty 1

## 2019-08-03 MED ORDER — LOPERAMIDE HCL 2 MG PO CAPS
2.0000 mg | ORAL_CAPSULE | ORAL | Status: DC | PRN
  Administered 2019-08-03 – 2019-08-04 (×2): 2 mg via ORAL
  Filled 2019-08-03 (×2): qty 1

## 2019-08-03 MED ORDER — METOCLOPRAMIDE HCL 5 MG/ML IJ SOLN
5.0000 mg | Freq: Three times a day (TID) | INTRAMUSCULAR | Status: DC | PRN
  Administered 2019-08-03 – 2019-08-12 (×8): 5 mg via INTRAVENOUS
  Filled 2019-08-03 (×8): qty 2

## 2019-08-03 MED ORDER — POTASSIUM CHLORIDE 10 MEQ/100ML IV SOLN
10.0000 meq | INTRAVENOUS | Status: AC
  Administered 2019-08-03 (×2): 10 meq via INTRAVENOUS
  Filled 2019-08-03 (×2): qty 100

## 2019-08-03 MED ORDER — FAT EMULSION PLANT BASED 20 % IV EMUL
360.0000 mL | INTRAVENOUS | Status: AC
  Administered 2019-08-03: 360 mL via INTRAVENOUS
  Filled 2019-08-03: qty 360

## 2019-08-03 MED ORDER — TRACE MINERALS CU-MN-SE-ZN 300-55-60-3000 MCG/ML IV SOLN
INTRAVENOUS | Status: AC
  Filled 2019-08-03: qty 1992

## 2019-08-03 NOTE — Progress Notes (Signed)
She continues to have nausea, vomiting and diarrhea. Per MD will make NPO with ice chips for comfort.

## 2019-08-03 NOTE — Progress Notes (Addendum)
Patient was able to tolerate breakfast, but continues to have bouts of diarrhea. Notified MD.

## 2019-08-03 NOTE — Progress Notes (Signed)
This is a patient who was initially managed by Dr. Leonides Schanz who took her to the operating room for hysterectomy.  Intraoperatively, she had a bowel injury that resulted in exploratory laparotomy, bowel resection, and primary anastomosis.  She failed to do well after that procedure and ultimately returned to the operating room with Dr. Lysle Pearl, who had to resect the now leaking anastomosis and create a new one.  Care was assumed by Dr. Lysle Pearl.  The patient has had a persistent leukocytosis and watery stool.  C. difficile testing was negative.  She has been on TPN, but expressed a desire to eat.  She had a soft diet this morning, but states that she feels some continued nausea.  She is also getting Reglan and Phenergan.  Her leukocytosis has improved to 15.6 this morning.   Today's Vitals   08/03/19 0453 08/03/19 0620 08/03/19 1150 08/03/19 1919  BP: (!) 145/65   (!) 158/82  Pulse: 100   92  Resp: 20   20  Temp: 99 F (37.2 C)   99.5 F (37.5 C)  TempSrc: Oral     SpO2: 98%   98%  Weight:  108.2 kg    Height:      PainSc:   Asleep    Body mass index is 43.63 kg/m.  Focused abdominal exam: She has a midline wound that is dressed.  The dressing was just changed this morning.  There is serous drainage present.  There is a radiology placed drain with serosanguineous fluid.  Her abdomen is mildly tender to palpation.  Impression and plan: This is a 44 year old woman who underwent an attempted robotic hysterectomy with Dr. Larey Days on July 22, 2019.  That operation was complicated by a bowel injury that required exploratory laparotomy with partial small bowel resection.  Unfortunately, that anastomosis leaked and she was taken back to the operating room by Dr. Lysle Pearl, who resected the anastomosis and performed a fresh 1.  She has failed to make a lot of progress since that time.  Her leukocytosis is somewhat improved today.  After seeming to have tolerated a soft diet for breakfast, I was contacted by the  patient's nurse this evening, as she had had several episodes of nausea and vomiting.  I made her n.p.o. except for ice chips.  We will continue TPN.  Continue multi modal efforts to minimize nausea and vomiting, including Zofran, Reglan, and Phenergan.

## 2019-08-03 NOTE — Progress Notes (Addendum)
PHARMACY - TOTAL PARENTERAL NUTRITION CONSULT NOTE   Indication:  s/p laparoscopic converted to open total hysterectomy, left salpingectomy, cystoscopy and 25 cm small bowel resection (near distal ileum) secondary to bowel injury 3/8. Pt declined post operatively requiring another exploratory laparotomy, lysis of adhesions, abdominal wound washout, resection of previous anastomosis with ~20cm of small bowel removed and subsequent reanastomosis 3/14   Patient Measurements: Height: 5\' 2"  (157.5 cm) Weight: 238 lb 8.6 oz (108.2 kg) IBW/kg (Calculated) : 50.1 TPN AdjBW (KG): 70.9 Body mass index is 43.63 kg/m.  Assessment: 44 y.o. female  Hospital stay day 11,  exploratory laparotomy and repair of small bowel enterotomy as well as hysterectomy with OB/GYN 5 Days Post-Op re-exploration abdominal washout and resection of original anastomosis  Glucose / Insulin: BG 108-150, continue SSI q6h Electrolytes:   K 3.5 Renal: Scr <1, stable LFTs / TGs:  Prealbumin / albumin:  Intake / Output; MIVF:  GI Imaging: Surgeries / Procedures:   Central access:  07/30/19 TPN start date: 07/30/19  Nutritional Goals per RD recommendation 3/16 Kcal:  2400-2700kcal/day Protein:  120-135g/day Goal TPN rate is 83 mL/hr (Regimen at goal rate will provide 2472kcal/day, 100g/day protein, 2352 ml volume )   Fluid: MIVF 0.9% saline stopped  Current Nutrition:  NPO  Plan:   Continue Clinimix 5/20 with electrolytes at goal rate of 83 ml/hr  Diet resumed late 3/19 - will follow for possible TPN taper  Add MVI on Mondays and Thursdays, trace elements daily  Thiamine 100 mg daily x 3 days (3/16-3/18) completed  Continue 20% lipids @30ml /hr x 12 hrs/day   K 3.5 - Will order KCl 10 mEq IV x2 runs today, no other electrolyte replenishment warranted  Continue sensitive SSI q6h and adjust as needed   Pt at high refeeding risk; check P, K, and Mg daily for 5 days after TPN iniatition.   Monitor TPN labs  for 1st 3 days and then on Mon/Thurs, per protocol  Will recheck BMP in [place], PharmD, BCPS Clinical Pharmacist 08/03/2019 11:46 AM

## 2019-08-04 LAB — CBC WITH DIFFERENTIAL/PLATELET
Abs Immature Granulocytes: 0.98 10*3/uL — ABNORMAL HIGH (ref 0.00–0.07)
Basophils Absolute: 0.1 10*3/uL (ref 0.0–0.1)
Basophils Relative: 0 %
Eosinophils Absolute: 0.2 10*3/uL (ref 0.0–0.5)
Eosinophils Relative: 1 %
HCT: 24.9 % — ABNORMAL LOW (ref 36.0–46.0)
Hemoglobin: 7.9 g/dL — ABNORMAL LOW (ref 12.0–15.0)
Immature Granulocytes: 4 %
Lymphocytes Relative: 9 %
Lymphs Abs: 2.1 10*3/uL (ref 0.7–4.0)
MCH: 29.8 pg (ref 26.0–34.0)
MCHC: 31.7 g/dL (ref 30.0–36.0)
MCV: 94 fL (ref 80.0–100.0)
Monocytes Absolute: 1.1 10*3/uL — ABNORMAL HIGH (ref 0.1–1.0)
Monocytes Relative: 5 %
Neutro Abs: 17.9 10*3/uL — ABNORMAL HIGH (ref 1.7–7.7)
Neutrophils Relative %: 81 %
Platelets: 1004 10*3/uL (ref 150–400)
RBC: 2.65 MIL/uL — ABNORMAL LOW (ref 3.87–5.11)
RDW: 14.6 % (ref 11.5–15.5)
WBC: 22.4 10*3/uL — ABNORMAL HIGH (ref 4.0–10.5)
nRBC: 0.2 % (ref 0.0–0.2)

## 2019-08-04 LAB — GLUCOSE, CAPILLARY
Glucose-Capillary: 128 mg/dL — ABNORMAL HIGH (ref 70–99)
Glucose-Capillary: 135 mg/dL — ABNORMAL HIGH (ref 70–99)
Glucose-Capillary: 142 mg/dL — ABNORMAL HIGH (ref 70–99)
Glucose-Capillary: 148 mg/dL — ABNORMAL HIGH (ref 70–99)
Glucose-Capillary: 157 mg/dL — ABNORMAL HIGH (ref 70–99)

## 2019-08-04 LAB — BASIC METABOLIC PANEL
Anion gap: 10 (ref 5–15)
BUN: 9 mg/dL (ref 6–20)
CO2: 24 mmol/L (ref 22–32)
Calcium: 8.1 mg/dL — ABNORMAL LOW (ref 8.9–10.3)
Chloride: 101 mmol/L (ref 98–111)
Creatinine, Ser: 0.46 mg/dL (ref 0.44–1.00)
GFR calc Af Amer: >60 mL/min (ref >60)
GFR calc non Af Amer: >60 mL/min (ref >60)
Glucose, Bld: 160 mg/dL — ABNORMAL HIGH (ref 70–99)
Potassium: 3.7 mmol/L (ref 3.5–5.1)
Sodium: 135 mmol/L (ref 135–145)

## 2019-08-04 LAB — FERRITIN: Ferritin: 99 ng/mL (ref 11–307)

## 2019-08-04 LAB — CULTURE, BLOOD (ROUTINE X 2)
Culture: NO GROWTH
Culture: NO GROWTH

## 2019-08-04 LAB — RETIC PANEL
Immature Retic Fract: 24.8 % — ABNORMAL HIGH (ref 2.3–15.9)
RBC.: 2.27 MIL/uL — ABNORMAL LOW (ref 3.87–5.11)
Retic Count, Absolute: 89.2 10*3/uL (ref 19.0–186.0)
Retic Ct Pct: 3.9 % — ABNORMAL HIGH (ref 0.4–3.1)
Reticulocyte Hemoglobin: 22.8 pg — ABNORMAL LOW (ref >27.9)

## 2019-08-04 LAB — PHOSPHORUS: Phosphorus: 2.4 mg/dL — ABNORMAL LOW (ref 2.5–4.6)

## 2019-08-04 LAB — AEROBIC CULTURE W GRAM STAIN (SUPERFICIAL SPECIMEN): Special Requests: NORMAL

## 2019-08-04 LAB — MAGNESIUM: Magnesium: 2.1 mg/dL (ref 1.7–2.4)

## 2019-08-04 LAB — IRON AND TIBC
Iron: 13 ug/dL — ABNORMAL LOW (ref 28–170)
Saturation Ratios: 6 % — ABNORMAL LOW (ref 10.4–31.8)
TIBC: 238 ug/dL — ABNORMAL LOW (ref 250–450)
UIBC: 225 ug/dL

## 2019-08-04 MED ORDER — POTASSIUM PHOSPHATES 15 MMOLE/5ML IV SOLN
10.0000 mmol | Freq: Once | INTRAVENOUS | Status: AC
  Administered 2019-08-04: 14:00:00 10 mmol via INTRAVENOUS
  Filled 2019-08-04: qty 3.33

## 2019-08-04 MED ORDER — ENOXAPARIN SODIUM 40 MG/0.4ML ~~LOC~~ SOLN
40.0000 mg | Freq: Two times a day (BID) | SUBCUTANEOUS | Status: DC
  Administered 2019-08-04 (×2): 40 mg via SUBCUTANEOUS
  Filled 2019-08-04 (×2): qty 0.4

## 2019-08-04 MED ORDER — FAT EMULSION PLANT BASED 20 % IV EMUL
360.0000 mL | INTRAVENOUS | Status: AC
  Administered 2019-08-04: 360 mL via INTRAVENOUS
  Filled 2019-08-04: qty 360

## 2019-08-04 MED ORDER — TRACE MINERALS CU-MN-SE-ZN 300-55-60-3000 MCG/ML IV SOLN
INTRAVENOUS | Status: AC
  Filled 2019-08-04: qty 1992

## 2019-08-04 NOTE — Progress Notes (Signed)
PHARMACY - TOTAL PARENTERAL NUTRITION CONSULT NOTE   Indication:  s/p laparoscopic converted to open total hysterectomy, left salpingectomy, cystoscopy and 25 cm small bowel resection (near distal ileum) secondary to bowel injury 3/8. Pt declined post operatively requiring another exploratory laparotomy, lysis of adhesions, abdominal wound washout, resection of previous anastomosis with ~20cm of small bowel removed and subsequent reanastomosis 3/14   Patient Measurements: Height: 5\' 2"  (157.5 cm) Weight: 227 lb 8.2 oz (103.2 kg) IBW/kg (Calculated) : 50.1 TPN AdjBW (KG): 70.9 Body mass index is 41.61 kg/m.  Assessment: 44 y.o. female  Hospital stay day 11,  exploratory laparotomy and repair of small bowel enterotomy as well as hysterectomy with OB/GYN 5 Days Post-Op re-exploration abdominal washout and resection of original anastomosis  Glucose / Insulin: BG 108-150, continue SSI q6h, 3 units used 3/20 Electrolytes:   K 3.7, Phos 2.4 Renal: Scr <1, stable LFTs / TGs:  Prealbumin / albumin:  Intake / Output; MIVF:  GI Imaging: Surgeries / Procedures:   Central access:  07/30/19 TPN start date: 07/30/19  Nutritional Goals per RD recommendation 3/16 Kcal:  2400-2700kcal/day Protein:  120-135g/day Goal TPN rate is 83 mL/hr (Regimen at goal rate will provide 2472kcal/day, 100g/day protein, 2352 ml volume )  Fluid: MIVF 0.9% saline stopped  Current Nutrition:  NPO  Plan:   Continue Clinimix 5/20 with electrolytes at goal rate of 83 ml/hr  Diet resumed late 3/19 - will follow for possible TPN taper  per General Surgery, continue current rate 3/21   Add MVI on Mondays and Thursdays, trace elements daily  Thiamine 100 mg daily x 3 days (3/16-3/18) completed  Continue 20% lipids @30ml /hr x 12 hrs/day   K 3.7, Phos 2.4 -  Will order KPho 19mmol - no other electrolyte replenishment warranted  Continue sensitive SSI q6h and adjust as needed   Pt at high refeeding risk;  check P, K, and Mg daily for 5 days after TPN iniatition.   Monitor TPN labs for 1st 3 days and then on Mon/Thurs, per protocol  Will recheck BMP in AM   Lu Duffel, PharmD, BCPS Clinical Pharmacist 08/04/2019 10:53 AM

## 2019-08-04 NOTE — Progress Notes (Signed)
CRITICAL VALUE ALERT  Critical Value:  Platelets 1,004  Date & Time Notied:  08/04/19 0743  Provider Notified: Yes  Orders Received/Actions taken: Pending orders  Fuller Mandril, RN

## 2019-08-04 NOTE — Progress Notes (Signed)
This is a patient who was initially managed by Dr. Leonides Schanz who took her to the operating room for hysterectomy.  Intraoperatively, she had a bowel injury that resulted in exploratory laparotomy, bowel resection, and primary anastomosis.  She failed to do well after that procedure and ultimately returned to the operating room with Dr. Lysle Pearl, who had to resect the now-leaking anastomosis and create a new one.  Care was assumed by Dr. Lysle Pearl.  The patient has had a persistent leukocytosis and watery stool.  C. difficile testing was negative.  She has been on TPN, but expressed a desire to eat.  She was trialed on a diet, however yesterday, she had worsening nausea and vomiting.  She was once again made NPO.  This morning, she had a critical value of a platelet count of 1 million.  Today's Vitals   08/04/19 0500 08/04/19 0639 08/04/19 0800 08/04/19 1117  BP:  (!) 174/80  (!) 159/84  Pulse:  94  97  Resp:    19  Temp:    99.7 F (37.6 C)  TempSrc:    Oral  SpO2:    97%  Weight: 103.2 kg     Height:      PainSc:   0-No pain    Body mass index is 41.61 kg/m.  Focused abdominal exam: She has a midline wound that is dressed.  The dressing was just changed this morning, however it was saturated.  I changed it again.  No purulent drainage identified, even in the deep inferior portion of the wound..  There is serous drainage present.  There is a radiology placed drain with serosanguineous fluid.  Her abdomen is mildly tender to palpation.  Results for Karen, Aguirre (MRN [id number]) as of 08/04/2019 15:58  Ref. Range 08/03/2019 04:58 08/04/2019 06:42  Sodium Latest Ref Range: 135 - 145 mmol/L 136 135  Potassium Latest Ref Range: 3.5 - 5.1 mmol/L 3.5 3.7  Chloride Latest Ref Range: 98 - 111 mmol/L 104 101  CO2 Latest Ref Range: 22 - 32 mmol/L 24 24  Glucose Latest Ref Range: 70 - 99 mg/dL 137 (H) 160 (H)  BUN Latest Ref Range: 6 - 20 mg/dL 9 9  Creatinine Latest Ref Range: 0.44 - 1.00 mg/dL 0.49 0.46   Calcium Latest Ref Range: 8.9 - 10.3 mg/dL 7.5 (L) 8.1 (L)  Anion gap Latest Ref Range: 5 - 15  8 10   Phosphorus Latest Ref Range: 2.5 - 4.6 mg/dL 2.8 2.4 (L)  Magnesium Latest Ref Range: 1.7 - 2.4 mg/dL 2.1 2.1  GFR, Est Non African American Latest Ref Range: >60 mL/min >60 >60  GFR, Est African American Latest Ref Range: >60 mL/min >60 >60  WBC Latest Ref Range: 4.0 - 10.5 K/uL 15.6 (H) 22.4 (H)  RBC Latest Ref Range: 3.87 - 5.11 MIL/uL 2.35 (L) 2.65 (L)  Hemoglobin Latest Ref Range: 12.0 - 15.0 g/dL 6.9 (L) 7.9 (L)  HCT Latest Ref Range: 36.0 - 46.0 % 22.2 (L) 24.9 (L)  MCV Latest Ref Range: 80.0 - 100.0 fL 94.5 94.0  MCH Latest Ref Range: 26.0 - 34.0 pg 29.4 29.8  MCHC Latest Ref Range: 30.0 - 36.0 g/dL 31.1 31.7  RDW Latest Ref Range: 11.5 - 15.5 % 14.6 14.6  Platelets Latest Ref Range: 150 - 400 K/uL 801 (H) 1,004 (HH)  nRBC Latest Ref Range: 0.0 - 0.2 % 0.5 (H) 0.2   Impression and plan: This is a 44 year old woman who underwent an attempted robotic hysterectomy with  Dr. Vikki Ports Ward on July 22, 2019.  That operation was complicated by a bowel injury that required exploratory laparotomy with partial small bowel resection.  Unfortunately, that anastomosis leaked and she was taken back to the operating room by Dr. Lysle Pearl, who resected the anastomosis and performed a fresh 1.  She has failed to make a lot of progress since that time.  Yesterday, she failed a trial of dietary advancement and remains n.p.o. on TPN.  She had an increase in her white blood cell count, as well as thrombocytosis.  Hematology has been consulted for recommendations regarding her high platelet count.  She was initiated on subcutaneous Lovenox for DVT prophylaxis.  She may require a repeat CT scan if she continues to progress poorly.

## 2019-08-04 NOTE — Consult Note (Signed)
Hematology/Oncology Consult note Karen Aguirre [phone] [phone]   Patient Care Team: Hortencia Pilar, MD as PCP - General (Family Medicine)  CHIEF COMPLAINTS/REASON FOR VISIT:  Evaluation of thrombocytosis  HISTORY OF PRESENTING ILLNESS:  Karen Aguirre is a  44 y.o.  female who is current admitted due to due to complicated post op course since 0000000 TAH complicated by small bowel perforation and repair, anastomoses leak and repeat surgery 07/28/2019.  She has had persistent leukocytosis despite being antibiotics. Platelet counts also progressively increased.Hemonc was consulted.  She has normal pre-op white count and platelet counts.  Thrombocytosis started to trend up to 438,000 on 3/15/201, progressively increases to 1004,000 Her husband is at bedside.  Patient reports feeling nausea. Also multiple episodes of loose bowel movements.  Anemia, hemoglobin at 10.5 at admission, decreased during her admission, trended down to 6.9 on 3/20/201, today at 7.9.    Review of Systems  Constitutional: Positive for fatigue.  Respiratory: Negative for cough and shortness of breath.   Cardiovascular: Positive for leg swelling. Negative for chest pain.  Gastrointestinal: Positive for nausea.  Genitourinary: Negative for dysuria.   Musculoskeletal: Negative for neck stiffness.  Skin: Negative for rash.  Neurological: Negative for headaches.  Hematological: Negative for adenopathy.  Psychiatric/Behavioral: Negative for confusion.    MEDICAL HISTORY:  Past Medical History:  Diagnosis Date  . Anxiety   . Hypothyroidism   . Thyroid disease     SURGICAL HISTORY: Past Surgical History:  Procedure Laterality Date  . BOWEL RESECTION N/A 07/22/2019   Procedure: SMALL BOWEL RESECTION;  Surgeon: Ward, Honor Loh, MD;  Location: [facility];  Service: Gynecology;  Laterality: N/A;  . BOWEL RESECTION  07/27/2019   Procedure: SMALL BOWEL RESECTION;  Surgeon:  Ward, Honor Loh, MD;  Location: [facility];  Service: Gynecology;;  . CHOLECYSTECTOMY    . ECTOPIC PREGNANCY SURGERY    . HYSTERECTOMY ABDOMINAL WITH SALPINGECTOMY Bilateral 07/22/2019   Procedure: HYSTERECTOMY ABDOMINAL WITH lbilateral SALPINGECTOMY, removal of left anterior cul de sac nodule, cystoscopy;  Surgeon: Ward, Honor Loh, MD;  Location: [facility];  Service: Gynecology;  Laterality: Bilateral;  . LAPAROTOMY N/A 07/22/2019   Procedure: LAPAROTOMY;  Surgeon: Ward, Honor Loh, MD;  Location: [facility];  Service: Gynecology;  Laterality: N/A;  . LAPAROTOMY N/A 07/27/2019   Procedure: EXPLORATORY LAPAROTOMY;  Surgeon: Ward, Honor Loh, MD;  Location: [facility];  Service: Gynecology;  Laterality: N/A;  . ROBOTIC ASSISTED TOTAL HYSTERECTOMY WITH BILATERAL SALPINGO OOPHERECTOMY Bilateral 07/22/2019   Procedure: XI ROBOTIC ASSISTED TOTAL HYSTERECTOMY WITH BILATERAL SALPINGECTOMY _ATTEMPTED;  Surgeon: Ward, Honor Loh, MD;  Location: [facility];  Service: Gynecology;  Laterality: Bilateral;    SOCIAL HISTORY: Social History   Socioeconomic History  . Marital status: Married    Spouse name: Not on file  . Number of children: Not on file  . Years of education: Not on file  . Highest education level: Not on file  Occupational History  . Not on file  Tobacco Use  . Smoking status: Former Research scientist (life sciences)  . Smokeless tobacco: Never Used  . Tobacco comment: 20 years ago  Substance and Sexual Activity  . Alcohol use: Yes    Comment: ocassional  . Drug use: No  . Sexual activity: Not on file  Other Topics Concern  . Not on file  Social History Narrative  . Not on file   Social Determinants of Health   Financial Resource Strain:   . Difficulty of Paying Living Expenses:  Food Insecurity:   . Worried About Charity fundraiser in the Last Year:   . Arboriculturist in the Last Year:   Transportation Needs:   . Film/video editor (Medical):   Marland Kitchen Lack of Transportation (Non-Medical):   Physical Activity:    . Days of Exercise per Week:   . Minutes of Exercise per Session:   Stress:   . Feeling of Stress :   Social Connections:   . Frequency of Communication with Friends and Family:   . Frequency of Social Gatherings with Friends and Family:   . Attends Religious Services:   . Active Member of Clubs or Organizations:   . Attends Archivist Meetings:   Marland Kitchen Marital Status:   Intimate Partner Violence:   . Fear of Current or Ex-Partner:   . Emotionally Abused:   Marland Kitchen Physically Abused:   . Sexually Abused:     FAMILY HISTORY: History reviewed. No pertinent family history.  ALLERGIES:  is allergic to cobalt.  MEDICATIONS:  Current Facility-Administered Medications  Medication Dose Route Frequency Provider Last Rate Last Admin  . Marland KitchenTPN (CLINIMIX-E) Adult   Intravenous Continuous TPN Lu Duffel, RPH 83 mL/hr at 08/04/19 1500 Rate Verify at 08/04/19 1500  . Marland KitchenTPN (CLINIMIX-E) Adult   Intravenous Continuous TPN Shanlever, Pierce Crane, RPH      . 0.9 %  sodium chloride infusion   Intravenous PRN Tylene Fantasia, PA-C 10 mL/hr at 08/02/19 1532 1,000 mL at 08/02/19 1532  . acetaminophen (TYLENOL) tablet 650 mg  650 mg Oral Q6H PRN Sakai, Isami, DO   650 mg at 08/03/19 1050  . acidophilus (RISAQUAD) capsule 1 capsule  1 capsule Oral Daily Sakai, Isami, DO      . anidulafungin (ERAXIS) 100 mg in sodium chloride 0.9 % 100 mL IVPB  100 mg Intravenous Q24H Leonel Ramsay, MD   Stopped at 08/04/19 1345  . Chlorhexidine Gluconate Cloth 2 % PADS 6 each  6 each Topical Daily Ward, Honor Loh, MD   6 each at 08/04/19 1114  . enoxaparin (LOVENOX) injection 40 mg  40 mg Subcutaneous Q12H Fredirick Maudlin, MD   40 mg at 08/04/19 1239  . escitalopram (LEXAPRO) tablet 20 mg  20 mg Oral Daily Ward, Honor Loh, MD   20 mg at 08/03/19 0904  . Fat emulsion 20 % infusion 360 mL  360 mL Intravenous Continuous TPN Shanlever, Pierce Crane, RPH      . furosemide (LASIX) injection 20 mg  20 mg  Intravenous BID Ward, Honor Loh, MD   20 mg at 08/04/19 0843  . gabapentin (NEURONTIN) capsule 300 mg  300 mg Oral QHS Ward, Chelsea C, MD      . HYDROcodone-acetaminophen (NORCO/VICODIN) 5-325 MG per tablet 1 tablet  1 tablet Oral Q6H PRN Sakai, Isami, DO      . insulin aspart (novoLOG) injection 0-9 Units  0-9 Units Subcutaneous Q6H Ward, Honor Loh, MD   1 Units at 08/04/19 1239  . ipratropium-albuterol (DUONEB) 0.5-2.5 (3) MG/3ML nebulizer solution 3 mL  3 mL Nebulization Q4H PRN Minda Meo, CNM   3 mL at 07/28/19 1205  . levothyroxine (SYNTHROID, LEVOTHROID) injection 62.5 mcg  62.5 mcg Intravenous Daily Sakai, Isami, DO   62.5 mcg at 08/04/19 1156  . loperamide (IMODIUM) capsule 2 mg  2 mg Oral PRN Lysle Pearl, Isami, DO   2 mg at 08/04/19 1036  . LORazepam (ATIVAN) injection 1 mg  1 mg Intravenous QHS  PRN,MR X 1 Ward, Honor Loh, MD   1 mg at 07/30/19 2148  . menthol-cetylpyridinium (CEPACOL) lozenge 3 mg  1 lozenge Oral Q2H PRN Ward, Chelsea C, MD      . metoCLOPramide (REGLAN) injection 5 mg  5 mg Intravenous Q8H PRN Robert Bellow, MD   5 mg at 08/04/19 0834  . morphine 2 MG/ML injection 1 mg  1 mg Intravenous Q3H PRN Sakai, Isami, DO      . ondansetron (ZOFRAN) injection 4 mg  4 mg Intravenous Q4H PRN Ward, Honor Loh, MD   4 mg at 08/03/19 1744  . pantoprazole (PROTONIX) injection 40 mg  40 mg Intravenous QHS Ward, Honor Loh, MD   40 mg at 08/03/19 2311  . piperacillin-tazobactam (ZOSYN) IVPB 3.375 g  3.375 g Intravenous Q8H Ward, Honor Loh, MD   Stopped at 08/04/19 1258  . potassium PHOSPHATE 10 mmol in dextrose 5 % 250 mL infusion  10 mmol Intravenous Once Lu Duffel, RPH 42 mL/hr at 08/04/19 1500 Rate Verify at 08/04/19 1500  . promethazine (PHENERGAN) injection 25 mg  25 mg Intravenous Q6H PRN Herbert Pun, MD   25 mg at 08/04/19 0620  . scopolamine (TRANSDERM-SCOP) 1 MG/3DAYS 1.5 mg  1 patch Transdermal Q72H Ward, Chelsea C, MD   1.5 mg at 08/03/19 1809  . sodium  chloride flush (NS) 0.9 % injection 10-40 mL  10-40 mL Intracatheter Q12H Ward, Honor Loh, MD   10 mL at 08/04/19 1240  . sodium chloride flush (NS) 0.9 % injection 10-40 mL  10-40 mL Intracatheter PRN Ward, Honor Loh, MD   10 mL at 07/31/19 0544  . sodium chloride flush (NS) 0.9 % injection 5 mL  5 mL Intracatheter Q8H Sandi Mariscal, MD   5 mL at 08/04/19 1240     PHYSICAL EXAMINATION: Vitals:   08/04/19 0639 08/04/19 1117  BP: (!) 174/80 (!) 159/84  Pulse: 94 97  Resp:  19  Temp:  99.7 F (37.6 C)  SpO2:  97%   Filed Weights   08/02/19 0315 08/03/19 0620 08/04/19 0500  Weight: 288 lb 1.6 oz (130.7 kg) 238 lb 8.6 oz (108.2 kg) 227 lb 8.2 oz (103.2 kg)   Physical Exam Constitutional:      Appearance: She is not diaphoretic.  Eyes:     General: No scleral icterus. Cardiovascular:     Rate and Rhythm: Normal rate.     Heart sounds: No murmur.  Pulmonary:     Effort: Pulmonary effort is normal.  Abdominal:     Palpations: Abdomen is soft.     Comments: Mildly distended.   Musculoskeletal:        General: Normal range of motion.     Cervical back: Normal range of motion and neck supple.     Comments: Bilateral edema 1+  Skin:    General: Skin is warm and dry.     Coloration: Skin is pale.  Neurological:     Mental Status: She is alert.     LABORATORY DATA:  I have reviewed the data as listed Lab Results  Component Value Date   WBC 22.4 (H) 08/04/2019   HGB 7.9 (L) 08/04/2019   HCT 24.9 (L) 08/04/2019   MCV 94.0 08/04/2019   PLT 1,004 ([place]) 08/04/2019   Recent Labs    07/26/19 1707 07/27/19 1347 07/28/19 0844 07/29/19 0544 08/01/19 0535 08/01/19 0535 08/02/19 0547 08/03/19 0458 08/04/19 0642  NA 137   < > 134*   < >  140   < > 138 136 135  K 3.0*   < > 2.8*   < > 3.0*   < > 3.4* 3.5 3.7  CL 105   < > 101   < > 106   < > 105 104 101  CO2 21*   < > 25   < > 30   < > 23 24 24   GLUCOSE 83   < > 111*   < > 118*   < > 156* 137* 160*  BUN 6   < > 6   < > <5*   <  > 6 9 9   CREATININE 0.53   < > 0.53   < > 0.40*   < > 0.44 0.49 0.46  CALCIUM 7.5*   < > 6.8*   < > 7.6*   < > 7.8* 7.5* 8.1*  GFRNONAA >60   < > >60   < > >60   < > >60 >60 >60  GFRAA >60   < > >60   < > >60   < > >60 >60 >60  PROT 5.6*  --  4.7*  --  6.0*  --   --   --   --   ALBUMIN 2.3*  --  2.0*  --  2.2*  --   --   --   --   AST 15  --  18  --  34  --   --   --   --   ALT 19  --  18  --  33  --   --   --   --   ALKPHOS 59  --  47  --  64  --   --   --   --   BILITOT 1.4*  --  0.9  --  0.6  --   --   --   --    < > = values in this interval not displayed.   Iron/TIBC/Ferritin/ %Sat No results found for: IRON, TIBC, FERRITIN, IRONPCTSAT    RADIOGRAPHIC STUDIES: I have personally reviewed the radiological images as listed and agreed with the findings in the report. DG Chest 1 View  Result Date: 07/28/2019 CLINICAL DATA:  Tachypnea, recent hysterectomy EXAM: CHEST  1 VIEW COMPARISON:  07/27/2019 chest radiograph. FINDINGS: Low lung volumes. Stable cardiomediastinal silhouette with normal heart size. No pneumothorax. No pleural effusion. Mild right basilar atelectasis, unchanged. No pulmonary edema. No acute consolidative airspace disease. IMPRESSION: Stable low lung volumes with mild right basilar atelectasis. Electronically Signed   By: Ilona Sorrel M.D.   On: 07/28/2019 16:45   DG Chest 1 View  Result Date: 07/27/2019 CLINICAL DATA:  Tachypnea.  Recent abdominal surgery. EXAM: CHEST  1 VIEW COMPARISON:  11/03/2010 FINDINGS: Lung volumes are low. There is opacity at the right lung base consistent with atelectasis. Remainder of the lungs is clear. No convincing pleural effusion and no pneumothorax. Cardiac silhouette is normal in size. No mediastinal or hilar masses. Skeletal structures are grossly intact. IMPRESSION: 1. No acute cardiopulmonary disease. 2. Mild right lung base atelectasis. Electronically Signed   By: Lajean Manes M.D.   On: 07/27/2019 16:15   DG Abd 1 View  Result  Date: 07/22/2019 CLINICAL DATA:  Evaluate for possible retained sponge EXAM: ABDOMEN - 1 VIEW COMPARISON:  None. FINDINGS: Scattered large and small bowel gas is noted. No abnormal mass or abnormal calcifications are seen. Postsurgical changes are noted. No radiopaque foreign body is identified  to suggest retained sponge. IMPRESSION: No evidence of retained foreign body. These results will be called to the ordering clinician or representative by the Radiologist Assistant, and communication documented in the PACS or zVision Dashboard. Electronically Signed   By: Inez Catalina M.D.   On: 07/22/2019 22:49   [place] Abscess Drain  Result Date: 08/02/2019 INDICATION: 44 year old with recent hysterectomy and bowel surgery. Patient has elevated white blood cell count and small intra-abdominal fluid collections. EXAM: ULTRASOUND-GUIDED ASPIRATION OF ABDOMINAL FLUID MEDICATIONS: Fentanyl 25 mcg ANESTHESIA/SEDATION: The patient was continuously monitored during the procedure by the interventional radiology nurse under my direct supervision. COMPLICATIONS: None immediate. PROCEDURE: Informed written consent was obtained from the patient after a thorough discussion of the procedural risks, benefits and alternatives. All questions were addressed. A timeout was performed prior to the initiation of the procedure. Abdomen was evaluated with ultrasound. Pocket of fluid in the anterior right lower abdomen was targeted just medial to the existing percutaneous drain. Skin was prepped with chlorhexidine and sterile field was created. Skin and soft tissues were anesthetized with 1% lidocaine. Using ultrasound guidance, a Yueh catheter was directed into this peritoneal fluid. Only 3 mL of fluid could be obtained. Unable to aspirate majority of the fluid in this area. Bandage placed over the puncture site. FINDINGS: Small pocket of fluid along the right anterior lower abdomen just medial to the existing percutaneous drain. Needle position  confirmed within this small collection but only a small amount of slightly thick red fluid was removed. IMPRESSION: Ultrasound-guided aspiration of fluid from the right anterior abdomen. Only a small amount of slightly thick red fluid could be obtained. Findings could represent blood products or hematoma formation. Fluid was sent for culture. Electronically Signed   By: Markus Daft M.D.   On: 08/02/2019 17:11   [place] Abscess Drain  Result Date: 07/26/2019 INDICATION: History of hysterectomy complicated by small bowel injury requiring resection and primary anastomosis. CT scan of the abdomen and pelvis performed earlier today secondary to elevated white blood cell count and fever demonstrated indeterminate fluid collections within the abdomen with dominant collection within the right lower abdominal quadrant As such, request made for ultrasound-guided paracentesis versus drainage catheter placement for and diagnostic and potentially therapeutic purposes. EXAM: ULTRASOUND GUIDED ABSCESS DRAINAGE COMPARISON:  CT abdomen and pelvis - earlier same day MEDICATIONS: The patient is currently admitted to the hospital and receiving intravenous antibiotics. The antibiotics were administered within an appropriate time frame prior to the initiation of the procedure. ANESTHESIA/SEDATION: None CONTRAST:  None COMPLICATIONS: None immediate. PROCEDURE: Informed written consent was obtained from the patient after a discussion of the risks, benefits and alternatives to treatment. The patient was placed supine on her hospital bed and sonographic evaluation was performed of the abdomen demonstrating ill-defined fluid scattered throughout the abdomen with dominant collection within right lower abdominal quadrant measuring approximately 9.6 x 8.2 x 3.9 cm, correlating with the dominant collection seen on preceding abdominal CT image 66, series 2. The procedure was planned. A timeout was performed prior to the initiation of the procedure.  The skin overlying the right lower abdomen was prepped and draped in the usual sterile fashion. The overlying soft tissues were anesthetized with 1% lidocaine with epinephrine. Appropriate trajectory was planned with the use of a 22 gauge spinal needle. An 18 gauge trocar needle was advanced into the abscess/fluid collection and a short Amplatz super stiff wire was coiled within the collection. Multiple ultrasound images were saved for procedural documentation purposes Next,  the track was dilated ultimately allowing placement of a 10 [place] all-purpose drainage catheter. Next, approximately 150 cc of brown serous ascitic fluid was aspirated. A representative sample was capped and sent to the laboratory for analysis. The tube was connected to a drainage bag and sutured in place. A dressing was placed. The patient tolerated the procedure well without immediate post procedural complication. IMPRESSION: Successful ultrasound guided placement of a 10 [place] all purpose drain catheter into the dominant collection within the right lower abdomen with aspiration of 150 cc of brown, serous ascitic fluid. Samples were sent to the laboratory as requested by the ordering clinical team. Electronically Signed   By: Sandi Mariscal M.D.   On: 07/26/2019 17:30   CT ABDOMEN PELVIS W CONTRAST  Result Date: 07/31/2019 CLINICAL DATA:  44 yo F s/p exp. Lap and repair of small bowel enterotomy as well as hysterectomy with OB/GYN and surgery involved. Reexploration abdominal washout and resection of original anastomosis today 3/14. EXAM: CT ABDOMEN AND PELVIS WITH CONTRAST TECHNIQUE: Multidetector CT imaging of the abdomen and pelvis was performed using the standard protocol following bolus administration of intravenous contrast. CONTRAST:  16mL OMNIPAQUE IOHEXOL 300 MG/ML  SOLN COMPARISON:  07/26/2019 FINDINGS: Lower chest: Dependent atelectasis, greater on the right, similar to the prior CT. No acute findings. Hepatobiliary: No focal  liver abnormality is seen. Status post cholecystectomy. No biliary dilatation. Pancreas: Unremarkable. No pancreatic ductal dilatation or surrounding inflammatory changes. Spleen: Normal in size without focal abnormality. Adrenals/Urinary Tract: No adrenal masses. Stable low-density renal masses consistent with cysts on the left. Mild prominence of the right intrarenal collecting system. No renal or ureteral stones. No left renal collecting system dilation. Normal left ureter. Symmetric renal enhancement and excretion. Bladder minimally distended. Foley catheter in place. No bladder mass. Stomach/Bowel: Normal stomach. Mesenteric edema and inflammation most evident in the central to lower abdomen. Right lower quadrant percutaneously placed drainage catheter significantly decreases the size of the previously seen right lower quadrant collection. Residual collection measures 6.4 x 2.9 cm transversely. Small anterior less well-defined fluid collection is seen, to the right of midline, near the level of the umbilicus, 6.3 x 1.9 cm transversely. Fluid is noted within leaves of the small bowel mesentery most evident mid to lower abdomen, without additional defined collection. Bowel anastomosis staples are noted upper anterior pelvis extending to the right lower quadrant. No evidence of bowel obstruction. Several loops of central to lower abdomen small bowel demonstrate mild wall thickening. Colon is mostly decompressed. No colonic wall thickening. Vascular/Lymphatic: No vascular abnormality. Several prominent mesenteric lymph nodes most evident right lower quadrant, all subcentimeter and similar to the prior CT. Reproductive: Status post hysterectomy. No adnexal masses. Other: Anterior abdominal wall air adjacent to the midline incision. No hernia. No abdominal wall collection to suggest abscess. Small amount of ascites. Tiny bubbles of free intraperitoneal air are noted deep to the midline incision. Musculoskeletal: No  acute or significant abnormality. IMPRESSION: 1. No bowel obstruction. Mild small bowel wall thickening in the lower abdomen is likely reactive/edema from the recent surgery. 2. Significant improvement the right lower quadrant collection following placement of a percutaneous drainage catheter. 3. No new abdominal collections. Small amount of ascites and diffuse mesenteric edema most evident mid to lower abdomen. 4. Few tiny bubbles of anterior midline free air, presumed postsurgical. Electronically Signed   By: Lajean Manes M.D.   On: 07/31/2019 10:48   CT ABDOMEN PELVIS W CONTRAST  Result Date: 07/26/2019 CLINICAL DATA:  Abdominal pain and tenderness. Leukocytosis. Recent small bowel resection and hysterectomy. EXAM: CT ABDOMEN AND PELVIS WITH CONTRAST TECHNIQUE: Multidetector CT imaging of the abdomen and pelvis was performed using the standard protocol following bolus administration of intravenous contrast. CONTRAST:  127mL OMNIPAQUE IOHEXOL 300 MG/ML  SOLN COMPARISON:  06/21/2019 FINDINGS: Lower Chest: New bibasilar atelectasis. Hepatobiliary: No hepatic masses identified. Prior cholecystectomy. No evidence of biliary obstruction. Pancreas:  No mass or inflammatory changes. Spleen: Within normal limits in size and appearance. Adrenals/Urinary Tract: Stable tiny bilateral renal cysts. No masses identified. No evidence of ureteral calculi or hydronephrosis. Stomach/Bowel: A small amount of extraperitoneal air in the lower pelvis, and intraperitoneal air, are consistent with recent surgery. No evidence of bowel obstruction. Mildly dilated small bowel loops are seen containing air-fluid levels and increased gaseous distention of colon is also seen, suspicious for adynamic ileus. Diffuse mesenteric inflammatory changes or edema are new since previous study. Multiple small loculated fluid intraperitoneal fluid collections are seen in the abdomen and pelvis, 1 in the right lower quadrant measuring 9.8 x 5.1 cm on  image 66/2. Vascular/Lymphatic: No pathologically enlarged lymph nodes. No abdominal aortic aneurysm. Reproductive: Prior hysterectomy noted. Rim enhancing fluid collection seen in the hysterectomy bed which measures 8.0 x 7.4 cm on image 67/7. Other:  None. Musculoskeletal:  No suspicious bone lesions identified. IMPRESSION: 1. Small amount of intraperitoneal and extraperitoneal air, consistent with recent surgery. 2. Multiple loculated intraperitoneal fluid collections in the abdomen and pelvis, largest in the right lower quadrant measuring 9.8 cm and pelvic cul-de-sac measuring 8.0 cm, suspicious for abscesses. 3. Mild diffuse dilatation of small bowel and colon, suspicious for adynamic ileus. 4. New bibasilar atelectasis. Electronically Signed   By: Marlaine Hind M.D.   On: 07/26/2019 13:38   US Abdomen Limited  Result Date: 07/30/2019 CLINICAL DATA:  Drainage from abdominal wall incision site for hysterectomy. Evaluate for abscess. EXAM: LIMITED ULTRASOUND OF ABDOMINAL SOFT TISSUES TECHNIQUE: Ultrasound examination of the anterior abdominal wall soft tissues was performed in the area of clinical concern at the previous incision site. COMPARISON:  None. FINDINGS: A small fluid collection is seen within the subcutaneous tissues of the midline abdominal wall just above the umbilicus which measures 0.5 x 0.5 x 1.1 cm. A 2nd small complex fluid collection is seen just below the level of the umbilicus which measures 1.3 x 0.5 x 1.1 cm. IMPRESSION: Two small approximately 1 cm complex fluid collections are identified in the midline abdominal wall subcutaneous tissues. These may represent postop seromas or abscesses. Electronically Signed   By: Marlaine Hind M.D.   On: 07/30/2019 11:41   US Venous Img Lower Bilateral (DVT)  Result Date: 07/27/2019 CLINICAL DATA:  Tachycardia.  History of recent surgery. EXAM: BILATERAL LOWER EXTREMITY VENOUS DOPPLER ULTRASOUND TECHNIQUE: Gray-scale sonography with compression, as  well as color and duplex ultrasound, were performed to evaluate the deep venous system(s) from the level of the common femoral vein through the popliteal and proximal calf veins. COMPARISON:  None. FINDINGS: VENOUS Normal compressibility of the common femoral, superficial femoral, and popliteal veins, as well as the visualized calf veins. Visualized portions of profunda femoral vein and great saphenous vein unremarkable. No filling defects to suggest DVT on grayscale or color Doppler imaging. Doppler waveforms show normal direction of venous flow, normal respiratory phasicity and response to augmentation. Limited views of the contralateral common femoral vein are unremarkable. OTHER Edema noted in the legs bilaterally. Limitations: none IMPRESSION: No femoropopliteal DVT nor evidence of  DVT within the visualized calf veins. If clinical symptoms are inconsistent or if there are persistent or worsening symptoms, further imaging (possibly involving the iliac veins) may be warranted. Electronically Signed   By: Lajean Manes M.D.   On: 07/27/2019 16:16   [place] EKG SITE RITE  Result Date: 07/30/2019 If Site Rite image not attached, placement could not be confirmed due to current cardiac rhythm.     ASSESSMENT & PLAN:  # Thrombocytosis,  Completely normal pre-operative platelet counts, context of complicated post surgery course with small bowel perforation status post repair, thrombocytosis likely reactive due to acute inflammation.  Underlying iron deficiency may contribute to thrombocytosis.  No need for cytoreductive therapy or platelet pheresis at this point.  Close monitor counts. Check blood smear, iron panel, reticulocyte panel. Discussed about possibility of IV iron infusions. Allergy reactions/infusion reaction including anaphylactic reaction discussed with patient. Other side effects include but not limited to high blood pressure, skin rash, weight gain, leg swelling, etc. Patient voices understanding  and willing to proceed if iron panel showed low iron level. Labs are reviewed. Iron deficiency anemia, Low reticulocyte hemoglobin and decreased iron saturation. Ferritin is 99, can be elevated due to acute inflammation. I recommend IV iron infusion. Ordered IV Venofer.    All questions were answered. The patient knows to call the clinic with any problems questions or concerns.   Earlie Server, MD, PhD Hematology Oncology [facility] at [facility] Pager- [phone] 08/04/2019

## 2019-08-05 ENCOUNTER — Encounter
Admission: RE | Disposition: A | Discharge status: Home Health | Payer: Self-pay | Source: Home / Self Care | Attending: Surgery

## 2019-08-05 ENCOUNTER — Inpatient Hospital Stay: Payer: Managed Care, Other (non HMO) | Admitting: Anesthesiology

## 2019-08-05 DIAGNOSIS — D649 Anemia, unspecified: Secondary | ICD-10-CM

## 2019-08-05 DIAGNOSIS — D75839 Thrombocytosis, unspecified: Secondary | ICD-10-CM

## 2019-08-05 DIAGNOSIS — D473 Essential (hemorrhagic) thrombocythemia: Secondary | ICD-10-CM

## 2019-08-05 DIAGNOSIS — D508 Other iron deficiency anemias: Secondary | ICD-10-CM

## 2019-08-05 DIAGNOSIS — R188 Other ascites: Secondary | ICD-10-CM

## 2019-08-05 HISTORY — PX: LAPAROTOMY: SHX154

## 2019-08-05 HISTORY — PX: ILEOSTOMY: SHX1783

## 2019-08-05 LAB — CBC WITH DIFFERENTIAL/PLATELET
Abs Immature Granulocytes: 0.8 10*3/uL — ABNORMAL HIGH (ref 0.00–0.07)
Basophils Absolute: 0.1 10*3/uL (ref 0.0–0.1)
Basophils Relative: 0 %
Eosinophils Absolute: 0.2 10*3/uL (ref 0.0–0.5)
Eosinophils Relative: 1 %
HCT: 25.7 % — ABNORMAL LOW (ref 36.0–46.0)
Hemoglobin: 8.2 g/dL — ABNORMAL LOW (ref 12.0–15.0)
Immature Granulocytes: 4 %
Lymphocytes Relative: 12 %
Lymphs Abs: 2.7 10*3/uL (ref 0.7–4.0)
MCH: 29.5 pg (ref 26.0–34.0)
MCHC: 31.9 g/dL (ref 30.0–36.0)
MCV: 92.4 fL (ref 80.0–100.0)
Monocytes Absolute: 1.7 10*3/uL — ABNORMAL HIGH (ref 0.1–1.0)
Monocytes Relative: 7 %
Neutro Abs: 17.2 10*3/uL — ABNORMAL HIGH (ref 1.7–7.7)
Neutrophils Relative %: 76 %
Platelets: 1337 10*3/uL (ref 150–400)
RBC: 2.78 MIL/uL — ABNORMAL LOW (ref 3.87–5.11)
RDW: 14.6 % (ref 11.5–15.5)
Smear Review: INCREASED
WBC: 22.6 10*3/uL — ABNORMAL HIGH (ref 4.0–10.5)
nRBC: 0.3 % — ABNORMAL HIGH (ref 0.0–0.2)

## 2019-08-05 LAB — TRIGLYCERIDES: Triglycerides: 166 mg/dL — ABNORMAL HIGH (ref <150)

## 2019-08-05 LAB — COMPREHENSIVE METABOLIC PANEL
ALT: 50 U/L — ABNORMAL HIGH (ref 0–44)
AST: 41 U/L (ref 15–41)
Albumin: 2.6 g/dL — ABNORMAL LOW (ref 3.5–5.0)
Alkaline Phosphatase: 102 U/L (ref 38–126)
Anion gap: 9 (ref 5–15)
BUN: 12 mg/dL (ref 6–20)
CO2: 26 mmol/L (ref 22–32)
Calcium: 8.4 mg/dL — ABNORMAL LOW (ref 8.9–10.3)
Chloride: 99 mmol/L (ref 98–111)
Creatinine, Ser: 0.55 mg/dL (ref 0.44–1.00)
GFR calc Af Amer: >60 mL/min (ref >60)
GFR calc non Af Amer: >60 mL/min (ref >60)
Glucose, Bld: 130 mg/dL — ABNORMAL HIGH (ref 70–99)
Potassium: 3.5 mmol/L (ref 3.5–5.1)
Sodium: 134 mmol/L — ABNORMAL LOW (ref 135–145)
Total Bilirubin: 0.6 mg/dL (ref 0.3–1.2)
Total Protein: 7.7 g/dL (ref 6.5–8.1)

## 2019-08-05 LAB — GLUCOSE, CAPILLARY
Glucose-Capillary: 142 mg/dL — ABNORMAL HIGH (ref 70–99)
Glucose-Capillary: 150 mg/dL — ABNORMAL HIGH (ref 70–99)
Glucose-Capillary: 153 mg/dL — ABNORMAL HIGH (ref 70–99)

## 2019-08-05 LAB — PHOSPHORUS: Phosphorus: 3.1 mg/dL (ref 2.5–4.6)

## 2019-08-05 LAB — MAGNESIUM: Magnesium: 2.1 mg/dL (ref 1.7–2.4)

## 2019-08-05 LAB — PREALBUMIN: Prealbumin: 13.6 mg/dL — ABNORMAL LOW (ref 18–38)

## 2019-08-05 LAB — PATHOLOGIST SMEAR REVIEW

## 2019-08-05 SURGERY — LAPAROTOMY, EXPLORATORY
Anesthesia: General

## 2019-08-05 MED ORDER — ONDANSETRON HCL 4 MG/2ML IJ SOLN: 4.0000 mg | Freq: Once | INTRAMUSCULAR | Status: DC | PRN

## 2019-08-05 MED ORDER — HYDROMORPHONE HCL 1 MG/ML IJ SOLN
0.5000 mg | INTRAMUSCULAR | Status: DC | PRN
  Administered 2019-08-05 – 2019-08-12 (×46): 0.5 mg via INTRAVENOUS
  Filled 2019-08-05 (×44): qty 0.5

## 2019-08-05 MED ORDER — VASOPRESSIN 20 UNIT/ML IV SOLN
INTRAVENOUS | Status: DC | PRN
  Administered 2019-08-05 (×2): 1 [IU] via INTRAVENOUS
  Administered 2019-08-05 (×2): .5 [IU] via INTRAVENOUS
  Administered 2019-08-05: 1 [IU] via INTRAVENOUS

## 2019-08-05 MED ORDER — ROCURONIUM BROMIDE 100 MG/10ML IV SOLN
INTRAVENOUS | Status: DC | PRN
  Administered 2019-08-05: 30 mg via INTRAVENOUS
  Administered 2019-08-05: 40 mg via INTRAVENOUS
  Administered 2019-08-05 (×2): 50 mg via INTRAVENOUS
  Administered 2019-08-05: 30 mg via INTRAVENOUS

## 2019-08-05 MED ORDER — ACETAMINOPHEN 10 MG/ML IV SOLN
1000.0000 mg | Freq: Four times a day (QID) | INTRAVENOUS | Status: AC | PRN
  Administered 2019-08-05: 1000 mg via INTRAVENOUS
  Filled 2019-08-05 (×2): qty 100

## 2019-08-05 MED ORDER — SUGAMMADEX SODIUM 500 MG/5ML IV SOLN
INTRAVENOUS | Status: DC | PRN
  Administered 2019-08-05: 250 mg via INTRAVENOUS

## 2019-08-05 MED ORDER — FENTANYL CITRATE (PF) 100 MCG/2ML IJ SOLN
INTRAMUSCULAR | Status: DC | PRN
  Administered 2019-08-05: 50 ug via INTRAVENOUS
  Administered 2019-08-05: 25 ug via INTRAVENOUS
  Administered 2019-08-05 (×2): 50 ug via INTRAVENOUS
  Administered 2019-08-05: 25 ug via INTRAVENOUS
  Administered 2019-08-05: 50 ug via INTRAVENOUS

## 2019-08-05 MED ORDER — MIDAZOLAM HCL 2 MG/2ML IJ SOLN
INTRAMUSCULAR | Status: AC
  Filled 2019-08-05: qty 2

## 2019-08-05 MED ORDER — FAT EMULSION PLANT BASED 20 % IV EMUL
360.0000 mL | INTRAVENOUS | Status: AC
  Administered 2019-08-05: 360 mL via INTRAVENOUS
  Filled 2019-08-05: qty 360

## 2019-08-05 MED ORDER — SODIUM CHLORIDE 0.9 % IV SOLN
200.0000 mg | Freq: Every day | INTRAVENOUS | Status: AC
  Administered 2019-08-05 – 2019-08-06 (×2): 200 mg via INTRAVENOUS
  Filled 2019-08-05 (×2): qty 10

## 2019-08-05 MED ORDER — PHENYLEPHRINE HCL-NACL 20-0.9 MG/250ML-% IV SOLN
INTRAVENOUS | Status: DC | PRN
  Administered 2019-08-05: 40 ug/min via INTRAVENOUS

## 2019-08-05 MED ORDER — LACTATED RINGERS IV SOLN: INTRAVENOUS | Status: DC | PRN

## 2019-08-05 MED ORDER — ONDANSETRON HCL 4 MG/2ML IJ SOLN
INTRAMUSCULAR | Status: DC | PRN
  Administered 2019-08-05: 4 mg via INTRAVENOUS

## 2019-08-05 MED ORDER — PROPOFOL 10 MG/ML IV BOLUS
INTRAVENOUS | Status: DC | PRN
  Administered 2019-08-05: 200 mg via INTRAVENOUS

## 2019-08-05 MED ORDER — SODIUM CHLORIDE 0.9 % IV SOLN: INTRAVENOUS | Status: DC | PRN

## 2019-08-05 MED ORDER — FENTANYL CITRATE (PF) 100 MCG/2ML IJ SOLN
25.0000 ug | INTRAMUSCULAR | Status: DC | PRN
  Administered 2019-08-05 (×4): 25 ug via INTRAVENOUS

## 2019-08-05 MED ORDER — TRACE MINERALS CU-MN-SE-ZN 300-55-60-3000 MCG/ML IV SOLN
INTRAVENOUS | Status: AC
  Filled 2019-08-05: qty 1992

## 2019-08-05 MED ORDER — PROPOFOL 10 MG/ML IV BOLUS
INTRAVENOUS | Status: AC
  Filled 2019-08-05: qty 20

## 2019-08-05 MED ORDER — SODIUM CHLORIDE (PF) 0.9 % IJ SOLN
INTRAMUSCULAR | Status: AC
  Filled 2019-08-05: qty 10

## 2019-08-05 MED ORDER — LIDOCAINE HCL (CARDIAC) PF 100 MG/5ML IV SOSY
PREFILLED_SYRINGE | INTRAVENOUS | Status: DC | PRN
  Administered 2019-08-05: 100 mg via INTRAVENOUS

## 2019-08-05 MED ORDER — ONDANSETRON HCL 4 MG/2ML IJ SOLN
INTRAMUSCULAR | Status: AC
  Filled 2019-08-05: qty 2

## 2019-08-05 MED ORDER — ALBUMIN HUMAN 5 % IV SOLN: INTRAVENOUS | Status: DC | PRN

## 2019-08-05 MED ORDER — FENTANYL CITRATE (PF) 250 MCG/5ML IJ SOLN
INTRAMUSCULAR | Status: AC
  Filled 2019-08-05: qty 5

## 2019-08-05 MED ORDER — BUPIVACAINE HCL (PF) 0.5 % IJ SOLN
INTRAMUSCULAR | Status: AC
  Filled 2019-08-05: qty 30

## 2019-08-05 MED ORDER — CHLORHEXIDINE GLUCONATE CLOTH 2 % EX PADS
6.0000 | MEDICATED_PAD | Freq: Every day | CUTANEOUS | Status: DC
  Administered 2019-08-06 – 2019-08-18 (×11): 6 via TOPICAL

## 2019-08-05 MED ORDER — VASOPRESSIN 20 UNIT/ML IV SOLN
INTRAVENOUS | Status: AC
  Filled 2019-08-05: qty 1

## 2019-08-05 MED ORDER — FENTANYL CITRATE (PF) 100 MCG/2ML IJ SOLN
INTRAMUSCULAR | Status: AC
  Filled 2019-08-05: qty 2

## 2019-08-05 MED ORDER — MIDAZOLAM HCL 2 MG/2ML IJ SOLN
INTRAMUSCULAR | Status: DC | PRN
  Administered 2019-08-05: 2 mg via INTRAVENOUS

## 2019-08-05 MED ORDER — FENTANYL CITRATE (PF) 100 MCG/2ML IJ SOLN
INTRAMUSCULAR | Status: AC
  Administered 2019-08-05: 25 ug via INTRAVENOUS
  Filled 2019-08-05: qty 2

## 2019-08-05 MED ORDER — LACTATED RINGERS IV BOLUS
1000.0000 mL | Freq: Once | INTRAVENOUS | Status: AC
  Administered 2019-08-05: 1000 mL via INTRAVENOUS

## 2019-08-05 MED ORDER — SUGAMMADEX SODIUM 500 MG/5ML IV SOLN
INTRAVENOUS | Status: AC
  Filled 2019-08-05: qty 5

## 2019-08-05 MED ORDER — ALBUMIN HUMAN 5 % IV SOLN
INTRAVENOUS | Status: AC
  Filled 2019-08-05: qty 250

## 2019-08-05 MED ORDER — LACTATED RINGERS IV SOLN: INTRAVENOUS | Status: DC

## 2019-08-05 MED ORDER — BUPIVACAINE LIPOSOME 1.3 % IJ SUSP
INTRAMUSCULAR | Status: AC
  Filled 2019-08-05: qty 20

## 2019-08-05 MED ORDER — ROCURONIUM BROMIDE 10 MG/ML (PF) SYRINGE
PREFILLED_SYRINGE | INTRAVENOUS | Status: AC
  Filled 2019-08-05: qty 10

## 2019-08-05 MED ORDER — ACETAMINOPHEN 10 MG/ML IV SOLN
INTRAVENOUS | Status: AC
  Filled 2019-08-05: qty 100

## 2019-08-05 MED ORDER — PIPERACILLIN-TAZOBACTAM 3.375 G IVPB
INTRAVENOUS | Status: AC
  Filled 2019-08-05: qty 50

## 2019-08-05 MED ORDER — SODIUM CHLORIDE (PF) 0.9 % IJ SOLN
INTRAMUSCULAR | Status: AC
  Filled 2019-08-05: qty 50

## 2019-08-05 MED ORDER — ACETAMINOPHEN 10 MG/ML IV SOLN
INTRAVENOUS | Status: DC | PRN
  Administered 2019-08-05: 1000 mg via INTRAVENOUS

## 2019-08-05 MED ORDER — BUPIVACAINE HCL (PF) 0.25 % IJ SOLN
INTRAMUSCULAR | Status: DC | PRN
  Administered 2019-08-05: 30 mL

## 2019-08-05 MED ORDER — PHENYLEPHRINE HCL (PRESSORS) 10 MG/ML IV SOLN
INTRAVENOUS | Status: DC | PRN
  Administered 2019-08-05 (×7): 100 ug via INTRAVENOUS
  Administered 2019-08-05: 200 ug via INTRAVENOUS

## 2019-08-05 SURGICAL SUPPLY — 64 items
2 Ethilon ×12 IMPLANT
CANISTER SUCT 1200ML W/VALVE (MISCELLANEOUS) ×3 IMPLANT
CANISTER WOUND CARE 500ML ATS (WOUND CARE) IMPLANT
CATH DRAINAGE MALECOT 26FR (CATHETERS) ×1 IMPLANT
CATH FOL LEG HOLDER (MISCELLANEOUS) ×3 IMPLANT
CATH MALECOT (CATHETERS) ×3
CHLORAPREP W/TINT 26 (MISCELLANEOUS) IMPLANT
CNTNR SPEC 2.5X3XGRAD LEK (MISCELLANEOUS) ×1
CONT SPEC 4OZ STER OR WHT (MISCELLANEOUS) ×2
CONTAINER SPEC 2.5X3XGRAD LEK (MISCELLANEOUS) ×1 IMPLANT
COVER BACK TABLE REUSABLE LG (DRAPES) ×3 IMPLANT
COVER CLAMP SIL LG PBX B (MISCELLANEOUS) ×3 IMPLANT
COVER WAND RF STERILE (DRAPES) ×3 IMPLANT
DRAPE LAPAROTOMY 100X77 ABD (DRAPES) ×3 IMPLANT
DRSG OPSITE POSTOP 4X10 (GAUZE/BANDAGES/DRESSINGS) IMPLANT
DRSG OPSITE POSTOP 4X8 (GAUZE/BANDAGES/DRESSINGS) IMPLANT
DRSG TEGADERM 4X10 (GAUZE/BANDAGES/DRESSINGS) IMPLANT
DRSG TELFA 3X8 NADH (GAUZE/BANDAGES/DRESSINGS) IMPLANT
DRSG VAC ATS LRG SENSATRAC (GAUZE/BANDAGES/DRESSINGS) IMPLANT
ELECT BLADE 6.5 EXT (BLADE) ×3 IMPLANT
ELECT CAUTERY BLADE 6.4 (BLADE) ×3 IMPLANT
ELECT REM PT RETURN 9FT ADLT (ELECTROSURGICAL) ×3
ELECTRODE REM PT RTRN 9FT ADLT (ELECTROSURGICAL) ×1 IMPLANT
GAUZE PACKING IODOFORM 1/2 (PACKING) ×3 IMPLANT
GAUZE SPONGE 4X4 12PLY STRL (GAUZE/BANDAGES/DRESSINGS) ×3 IMPLANT
GLOVE BIOGEL PI IND STRL 7.0 (GLOVE) ×4 IMPLANT
GLOVE BIOGEL PI INDICATOR 7.0 (GLOVE) ×8
GLOVE SURG SYN 6.5 ES PF (GLOVE) ×12 IMPLANT
GOWN STRL REUS W/ TWL LRG LVL3 (GOWN DISPOSABLE) ×4 IMPLANT
GOWN STRL REUS W/TWL LRG LVL3 (GOWN DISPOSABLE) ×8
JACKSON PRATT 10 (INSTRUMENTS) IMPLANT
KIT OSTOMY DRAINABLE 2.75 STR (WOUND CARE) ×3 IMPLANT
KIT TURNOVER KIT A (KITS) ×3 IMPLANT
LABEL OR SOLS (LABEL) ×3 IMPLANT
LIGASURE IMPACT 36 18CM CVD LR (INSTRUMENTS) ×3 IMPLANT
NS IRRIG 1000ML POUR BTL (IV SOLUTION) ×9 IMPLANT
PACK BASIN MAJOR ARMC (MISCELLANEOUS) ×3 IMPLANT
PACK COLON CLEAN CLOSURE (MISCELLANEOUS) IMPLANT
PAD ABD DERMACEA PRESS 5X9 (GAUZE/BANDAGES/DRESSINGS) ×3 IMPLANT
Retention suture bolsters ×3 IMPLANT
SOL PREP PVP 2OZ (MISCELLANEOUS) ×3
SOLUTION PREP PVP 2OZ (MISCELLANEOUS) ×1 IMPLANT
SPONGE DRAIN TRACH 4X4 STRL 2S (GAUZE/BANDAGES/DRESSINGS) ×6 IMPLANT
SPONGE KITTNER 5P (MISCELLANEOUS) ×6 IMPLANT
SPONGE LAP 18X18 RF (DISPOSABLE) ×6 IMPLANT
STAPLER SKIN PROX 35W (STAPLE) ×3 IMPLANT
SUT BOLSTER W/RETENTN TUBE (SUTURE) ×6 IMPLANT
SUT ETHILON 2 0 FSLX (SUTURE) ×12 IMPLANT
SUT ETHILON 3-0 FS-10 30 BLK (SUTURE) ×3
SUT PDS AB 1 TP1 54 (SUTURE) ×12 IMPLANT
SUT PDS AB 1 TP1 96 (SUTURE) ×3 IMPLANT
SUT SILK 2 0 (SUTURE) ×2
SUT SILK 2-0 18XBRD TIE 12 (SUTURE) ×1 IMPLANT
SUT SILK 3 0 (SUTURE) ×2
SUT SILK 3-0 (SUTURE) ×6 IMPLANT
SUT SILK 3-0 18XBRD TIE 12 (SUTURE) ×1 IMPLANT
SUT VIC AB 0 CT1 36 (SUTURE) ×3 IMPLANT
SUT VIC AB 3-0 SH 27 (SUTURE) ×2
SUT VIC AB 3-0 SH 27X BRD (SUTURE) ×1 IMPLANT
SUT VICRYL 3-0 CR8 SH (SUTURE) ×3 IMPLANT
SUTURE EHLN 3-0 FS-10 30 BLK (SUTURE) ×1 IMPLANT
SYR 10ML LL (SYRINGE) ×3 IMPLANT
SYR BULB IRRIG 60ML STRL (SYRINGE) ×3 IMPLANT
TRAY FOLEY MTR SLVR 16FR STAT (SET/KITS/TRAYS/PACK) ×3 IMPLANT

## 2019-08-05 NOTE — Progress Notes (Signed)
Primary nurse called and spoke to Dr. Hampton Abbot and notified him of pt MEW score being a 6. HR elevated. Respirations elevated. Orders to give LR Bolus now. Primary nurse to continue to monitor.

## 2019-08-05 NOTE — Anesthesia Procedure Notes (Signed)
Procedure Name: Intubation Date/Time: 08/05/2019 9:40 AM Performed by: Aline Brochure, CRNA Pre-anesthesia Checklist: Patient identified, Emergency Drugs available, Suction available and Patient being monitored Patient Re-evaluated:Patient Re-evaluated prior to induction Oxygen Delivery Method: Circle system utilized Preoxygenation: Pre-oxygenation with 100% oxygen Induction Type: IV induction Ventilation: Mask ventilation without difficulty Laryngoscope Size: McGraph and 3 Grade View: Grade I Tube type: Oral Tube size: 7.0 mm Number of attempts: 1 Airway Equipment and Method: Stylet and Video-laryngoscopy Placement Confirmation: ETT inserted through vocal cords under direct vision,  positive ETCO2 and breath sounds checked- equal and bilateral Secured at: 22 cm Tube secured with: Tape Dental Injury: Teeth and Oropharynx as per pre-operative assessment

## 2019-08-05 NOTE — Progress Notes (Signed)
PHARMACY - TOTAL PARENTERAL NUTRITION CONSULT NOTE   Indication:  s/p laparoscopic converted to open total hysterectomy, left salpingectomy, cystoscopy and 25 cm small bowel resection (near distal ileum) secondary to bowel injury 3/8. Pt declined post operatively requiring another exploratory laparotomy, lysis of adhesions, abdominal wound washout, resection of previous anastomosis with ~20cm of small bowel removed and subsequent reanastomosis 3/14   Patient Measurements: Height: 5\' 2"  (157.5 cm) Weight: 276 lb 3.8 oz (125.3 kg) IBW/kg (Calculated) : 50.1 TPN AdjBW (KG): 70.9 Body mass index is 50.52 kg/m.  Assessment: 44 y.o. female  s/p exploratory laparotomy and repair of small bowel enterotomy as well as hysterectomy with OB/GYN  Post-Op re-exploration abdominal washout and resection of original anastomosis  Glucose / Insulin: BG 128-150, continue SSI q6h, 3 units used 3/21 Electrolytes:   WNL Renal: Scr <1, stable LFTs / TGs:  Prealbumin / albumin:  Intake / Output; MIVF:  GI Imaging: Surgeries / Procedures:   Central access:  07/30/19 TPN start date: 07/30/19  Nutritional Goals per RD recommendation 3/16 Kcal:  2400-2700kcal/day Protein:  120-135g/day Goal TPN rate is 83 mL/hr (Regimen at goal rate will provide 2472kcal/day, 100g/day protein, 2352 ml volume )  Fluid: none  Current Nutrition:  NPO  Plan:   Continue Clinimix 5/20 with electrolytes at goal rate of 83 ml/hr  Add MVI on Mondays and Thursdays, trace elements daily  Continue 20% lipids @30ml /hr x 12 hrs/day   Continue sensitive SSI q6h and adjust as needed   Monitor TPN labs on Mon/Thurs, per protocol  Dallie Piles, PharmD, BCPS Clinical Pharmacist 08/05/2019 7:57 AM

## 2019-08-05 NOTE — Transfer of Care (Signed)
Immediate Anesthesia Transfer of Care Note  Patient: Karen Aguirre  Procedure(s) Performed: EXPLORATORY LAPAROTOMY (N/A ) ILEOSTOMY (N/A )  Patient Location: PACU  Anesthesia Type:General  Level of Consciousness: awake and alert   Airway & Oxygen Therapy: Patient Spontanous Breathing and Patient connected to face mask oxygen  Post-op Assessment: Report given to RN and Post -op Vital signs reviewed and stable  Post vital signs: Reviewed and stable  Last Vitals:  Vitals Value Taken Time  BP 121/63 08/05/19 1430  Temp 36.7 C 08/05/19 1430  Pulse 111 08/05/19 1439  Resp 29 08/05/19 1439  SpO2 99 % 08/05/19 1439  Vitals shown include unvalidated device data.  Last Pain:  Vitals:   08/05/19 1430  TempSrc:   PainSc: Asleep      Patients Stated Pain Goal: 0 ([id number])  Complications: No apparent anesthesia complications

## 2019-08-05 NOTE — Progress Notes (Signed)
Received call from the floor nurse at [phone] stating increased discharge from the midline as well as the right lower quadrant drain.  Patient examined by [id number], noted to have small bowel enteric contents copiously draining from the midline opening at the inferior portion, along with identical drainage noted in the drain bag.  Patient vitals show tachycardia but noted fever of 100.8.  Patient otherwise remains relatively comfortable at this time from a pain standpoint.  I explained to the patient that the drainage from the midline wound is concerning for fistula versus likely dehiscence.  Recommended emergent return to the operating room for exploratory laparotomy, possible bowel resection, possible ostomy placement.  I explained to patient and family members that since this is possibly a second failure of a bowel anastomosis, she is high risk of improper wound healing causing these leaks.  Pursuing a third anastomosis in a similar situation will likely end up with another failure, putting her health at greater risk despite the inconvenience and the risks associated with a ileostomy creation.  The risk of surgery include, but not limited to, recurrence, bleeding, chronic pain, post-op infxn, post-op SBO or ileus, hernias,  and need for re-operation to address said risks. The risks of general anesthetic, if used, includes MI, CVA, sudden death or even reaction to anesthetic medications also discussed. Alternatives include continued observation. Benefits include possible symptom relief, preventing further decline in health and possible death.  Typical post-op recovery time of additional days in hospital for observation afterwards also discussed.  The patient verbalized understanding and all questions were answered to the patient's satisfaction.

## 2019-08-05 NOTE — Anesthesia Preprocedure Evaluation (Signed)
Anesthesia Evaluation  Patient identified by MRN, date of birth, ID band Patient awake    Reviewed: Allergy & Precautions, NPO status , Patient's Chart, lab work & pertinent test results  History of Anesthesia Complications Negative for: history of anesthetic complications  Airway Mallampati: III       Dental   Pulmonary neg sleep apnea, neg COPD, Not current smoker, former smoker,           Cardiovascular (-) hypertension(-) Past MI and (-) CHF (-) dysrhythmias (-) Valvular Problems/Murmurs     Neuro/Psych neg Seizures Anxiety    GI/Hepatic Neg liver ROS, neg GERD  ,  Endo/Other  neg diabetesHypothyroidism Morbid obesity  Renal/GU negative Renal ROS     Musculoskeletal   Abdominal   Peds  Hematology   Anesthesia Other Findings   Reproductive/Obstetrics                             Anesthesia Physical  Anesthesia Plan  ASA: III  Anesthesia Plan: General   Post-op Pain Management:    Induction: Intravenous  PONV Risk Score and Plan: 3 and Ondansetron, Dexamethasone and Midazolam  Airway Management Planned: Oral ETT  Additional Equipment:   Intra-op Plan:   Post-operative Plan:   Informed Consent: I have reviewed the patients History and Physical, chart, labs and discussed the procedure including the risks, benefits and alternatives for the proposed anesthesia with the patient or authorized representative who has indicated his/her understanding and acceptance.       Plan Discussed with:   Anesthesia Plan Comments:         Anesthesia Quick Evaluation

## 2019-08-05 NOTE — Progress Notes (Signed)
Primary notified Charge nurse Raelyn Mora Nurse Donnalee Curry and charge nurse Elisha Headland that pt MEW is a 6 and that primary nurse has spoken to MD and is following orders as ordered. Primary nurse to continue to monitor.

## 2019-08-05 NOTE — Anesthesia Postprocedure Evaluation (Signed)
Anesthesia Post Note  Patient: Karen Aguirre  Procedure(s) Performed: EXPLORATORY LAPAROTOMY (N/A ) ILEOSTOMY (N/A )  Patient location during evaluation: PACU Anesthesia Type: General Level of consciousness: awake and alert Pain management: pain level controlled Vital Signs Assessment: post-procedure vital signs reviewed and stable Respiratory status: spontaneous breathing and respiratory function stable Cardiovascular status: stable Anesthetic complications: no     Last Vitals:  Vitals:   08/05/19 0834 08/05/19 1430  BP: 127/65 121/63  Pulse: (!) 119 (!) 119  Resp: 20 (!) 21  Temp: 37.9 C 36.7 C  SpO2: 99% 100%    Last Pain:  Vitals:   08/05/19 1430  TempSrc:   PainSc: (P) Asleep                 Lennox Dolberry K

## 2019-08-06 DIAGNOSIS — L02211 Cutaneous abscess of abdominal wall: Secondary | ICD-10-CM

## 2019-08-06 LAB — BASIC METABOLIC PANEL
Anion gap: 8 (ref 5–15)
BUN: 10 mg/dL (ref 6–20)
CO2: 26 mmol/L (ref 22–32)
Calcium: 7.7 mg/dL — ABNORMAL LOW (ref 8.9–10.3)
Chloride: 99 mmol/L (ref 98–111)
Creatinine, Ser: 0.39 mg/dL — ABNORMAL LOW (ref 0.44–1.00)
GFR calc Af Amer: >60 mL/min (ref >60)
GFR calc non Af Amer: >60 mL/min (ref >60)
Glucose, Bld: 179 mg/dL — ABNORMAL HIGH (ref 70–99)
Potassium: 3.7 mmol/L (ref 3.5–5.1)
Sodium: 133 mmol/L — ABNORMAL LOW (ref 135–145)

## 2019-08-06 LAB — GLUCOSE, CAPILLARY
Glucose-Capillary: 181 mg/dL — ABNORMAL HIGH (ref 70–99)
Glucose-Capillary: 169 mg/dL — ABNORMAL HIGH (ref 70–99)
Glucose-Capillary: 157 mg/dL — ABNORMAL HIGH (ref 70–99)
Glucose-Capillary: 144 mg/dL — ABNORMAL HIGH (ref 70–99)

## 2019-08-06 LAB — BODY FLUID CULTURE: Culture: NO GROWTH

## 2019-08-06 LAB — CBC
HCT: 23.3 % — ABNORMAL LOW (ref 36.0–46.0)
Hemoglobin: 7.5 g/dL — ABNORMAL LOW (ref 12.0–15.0)
MCH: 29.6 pg (ref 26.0–34.0)
MCHC: 32.2 g/dL (ref 30.0–36.0)
MCV: 92.1 fL (ref 80.0–100.0)
Platelets: 1005 10*3/uL (ref 150–400)
RBC: 2.53 MIL/uL — ABNORMAL LOW (ref 3.87–5.11)
RDW: 14.3 % (ref 11.5–15.5)
WBC: 27.7 10*3/uL — ABNORMAL HIGH (ref 4.0–10.5)
nRBC: 0.1 % (ref 0.0–0.2)

## 2019-08-06 LAB — MAGNESIUM: Magnesium: 1.8 mg/dL (ref 1.7–2.4)

## 2019-08-06 MED ORDER — TRACE MINERALS CU-MN-SE-ZN 300-55-60-3000 MCG/ML IV SOLN
INTRAVENOUS | Status: AC
  Filled 2019-08-06: qty 1992

## 2019-08-06 MED ORDER — MAGNESIUM SULFATE 2 GM/50ML IV SOLN
2.0000 g | Freq: Once | INTRAVENOUS | Status: AC
  Administered 2019-08-06: 2 g via INTRAVENOUS
  Filled 2019-08-06: qty 50

## 2019-08-06 MED ORDER — FAT EMULSION PLANT BASED 20 % IV EMUL
360.0000 mL | INTRAVENOUS | Status: AC
  Administered 2019-08-06: 360 mL via INTRAVENOUS
  Filled 2019-08-06: qty 360

## 2019-08-06 MED ORDER — HYDROMORPHONE HCL 1 MG/ML IJ SOLN
INTRAMUSCULAR | Status: AC
  Filled 2019-08-06: qty 1

## 2019-08-06 MED ORDER — ENOXAPARIN SODIUM 40 MG/0.4ML ~~LOC~~ SOLN
40.0000 mg | Freq: Two times a day (BID) | SUBCUTANEOUS | Status: DC
  Administered 2019-08-06 – 2019-08-07 (×3): 40 mg via SUBCUTANEOUS
  Filled 2019-08-06 (×3): qty 0.4

## 2019-08-06 MED ORDER — KETOROLAC TROMETHAMINE 30 MG/ML IJ SOLN
30.0000 mg | Freq: Three times a day (TID) | INTRAMUSCULAR | Status: DC | PRN
  Administered 2019-08-06 – 2019-08-07 (×4): 30 mg via INTRAVENOUS
  Filled 2019-08-06 (×4): qty 1

## 2019-08-06 NOTE — Progress Notes (Signed)
PHARMACY - TOTAL PARENTERAL NUTRITION CONSULT NOTE   Indication:  s/p laparoscopic converted to open total hysterectomy, left salpingectomy, cystoscopy and 25 cm small bowel resection (near distal ileum) secondary to bowel injury 3/8. Pt declined post operatively requiring another exploratory laparotomy, lysis of adhesions, abdominal wound washout, resection of previous anastomosis with ~20cm of small bowel removed and subsequent reanastomosis 3/14   Patient Measurements: Height: 5\' 2"  (157.5 cm) Weight: 276 lb 3.8 oz (125.3 kg) IBW/kg (Calculated) : 50.1 TPN AdjBW (KG): 70.9 Body mass index is 50.52 kg/m.  Assessment: 44 y.o. female  s/p exploratory laparotomy and repair of small bowel enterotomy as well as hysterectomy with OB/GYN  Post-Op re-exploration abdominal washout and resection of original anastomosis 3/23- emergent return to OR yest 3/22 d/t small bowel enteric contents copiously draining from the midline opening.   Glucose / Insulin: BG 130-181, continue SSI q6h, 3 units/ 24 hrs  Electrolytes:   WNL Renal: Scr <1, stable LFTs / TGs:  Prealbumin / albumin: 13.6/2.6 Intake / Output; MIVF:  GI Imaging: Surgeries / Procedures:   Central access:  07/30/19 TPN start date: 07/30/19  Nutritional Goals per RD recommendation 3/16 Kcal:  2400-2700kcal/day Protein:  120-135g/day Goal TPN rate is 83 mL/hr (Regimen at goal rate will provide 2472kcal/day, 100g/day protein, 2352 ml volume )  Fluid: none  ABX: Eraxis 3/19>> Zosyn 3/12 >>  Current Nutrition:  NPO  Plan:   Continue Clinimix 5/20 with electrolytes at goal rate of 83 ml/hr  Add MVI on Mondays and Thursdays, trace elements daily  Continue 20% lipids @30ml /hr x 12 hrs/day   Mag 1.8   Will order magnesium sulfate 2 gram IV x1  Continue sensitive SSI q6h and adjust as needed   Monitor TPN labs on Mon/Thurs, per protocol  Noralee Space, PharmD, BCPS Clinical Pharmacist 08/06/2019 10:48 AM

## 2019-08-06 NOTE — Op Note (Signed)
Preoperative diagnosis: Wound dehiscence  Postoperative diagnosis: Wound dehiscence, anastomotic leak  Procedure: Exploratory laparatomy, lysis of adhesions, abdominal washout, small bowel resection, ileostomy creation, end colostomy drain placement  Anesthesia: GETA  Surgeon: Benjamine Sprague, DO Assitant: Byrnett for additional exposure  Specimen:  Small bowel Complications: None apparent  EBL: 300 mL  Wound Classification: Contaminated  Indications:  Patient is a 44 y.o. female status post small bowel resection and reanastomosis x2 after bowel injury during hysterectomy.  Patient the morning of surgery presented with succus drainage from her midline wound, requiring emergent transfer back to the operating room for repair.  Please see progress notes for further details  Description of procedure:  The patient was placed in the supine position and general endotracheal anesthesia was induced. A time-out was completed verifying correct patient, procedure, site, positioning, and implant(s) and/or special equipment prior to beginning this procedure. Preoperative antibiotics were continued from floor. The abdomen was prepped and draped in the usual sterile fashion.  Midline staples were removed and copious amount of succus drainage was noted from the inferior aspect.  Once all the subcuticular sutures removed and fascia was examined, there was noted to be a small dehiscence at the most inferior aspect where the succus was pouring out from.  The entire fascial incision was reopened for further examination.  Visible succus all suctioned out.  As with the previous surgery, extensive adhesions between bowel, omentum, and abdominal walls were noted requiring extensive lysis of adhesions to visualize the area of concern.  The previous made anastomosis was noted to have a hole at the stapled anastomosis.  Multiple attempts were made to free this area from the surrounding tissue in order to gain adequate length  for resection at a point of normal-looking bowel for ileostomy and mucous fistula creation.  This was unsuccessful at both the distal and proximal end.    Due to the inability to free enough bowel from the extensive adhesions, decision was made to use the bowel at the open anastomosis line to create a ileostomy from proximal end, and to place a drain within the distal end, to bring up to the abdominal wall as a controlled fistula.  The stapled anastomosis site was separated using sharp dissection, and a Malecot drain was placed into the distal end and sutured into place using 3-0 Vicryl.  The most distal portion of the proximal end, was then trimmed to get to healthier looking tissue, and this was brought through the midline incision as an ileostomy, since this was the only area of the abdomen where the end was able to be brought through the very thick abdominal wall.  An area of serosal tear was noted close to this new ileostomy site, but due to the very friable tissue, primary repair or additional resection would have made a ileostomy creation impossible.  Therefore, the injured portion was pulled up above the fascial line, to abut against the subcutaneous tissue.  Retention sutures using 1-0 nylon was placed on each side of the midline incision and the midline fascia was closed with 1 PDS in an interrupted fashion, around the Malecot drain that came through the incision at the inferior aspect, and around the newly created ileostomy at the superior portion.  Retention sutures were then tied down and the skin was approximated using staples, again leaving an opening for the Malecot drain and the ileostomy.  The ileostomy was then matured by placing interrupted 3-0 Vicryl between the bowel and the surrounding skin layer.  Exparel  was infused prior to the closure of the midline wound, and the previously placed IR drain was placed around the ileostomy site for further monitoring.  At the end of the procedure the  ileostomy looked swollen, with patches of discoloration, but enough pink tissue to grossly look viable.  Ostomy appliance was placed around this and the rest of the incision was dressed with 4 x 4 and ABD pad.  NG tube was placed during the procedure successfully this time.  Patient was awakened from anesthesia and transferred to PACU in stable condition.  Sponge and instrument counts correct at end of procedure.

## 2019-08-06 NOTE — Progress Notes (Signed)
Subjective:  CC: Karen Aguirre is a 44 y.o. female  Hospital stay day 25,  exploratory laparotomy and repair of small bowel enterotomy as well as hysterectomy with OB/GYN, repeat ex-lap and re-anastamosis for failed anastamosis  1 Day Post-Op  Reexploration abdominal washout, LOA, end ileostomy and drain placement.  HPI:  No acute issues overnight.  Pain requiring dilaudid.  ROS:  General: Denies weight loss, weight gain, fatigue, fevers, chills, and night sweats. Heart: Denies chest pain, palpitations, racing heart, irregular heartbeat, leg pain or swelling, and decreased activity tolerance. Respiratory: Denies breathing difficulty, shortness of breath, wheezing, cough, and sputum. GI: Denies change in appetite, heartburn, constipation, diarrhea, and blood in stool. GU: Denies difficulty urinating, pain with urinating, urgency, frequency, blood in urine.   Objective:   Temp:  [98.5 F (36.9 C)-101.3 F (38.5 C)] 99.4 F (37.4 C) (03/23 1632) Pulse Rate:  [108-135] 114 (03/23 1632) Resp:  [18-37] 21 (03/23 1632) BP: (96-135)/(48-71) 135/64 (03/23 1632) SpO2:  [93 %-98 %] 94 % (03/23 1632) Weight:  [108.2 kg] 108.2 kg (03/23 1206)     Height: 5\' 2"  (157.5 cm) Weight: 108.2 kg BMI (Calculated): 43.62   Intake/Output this shift:   Intake/Output Summary (Last 24 hours) at 08/06/2019 1708 Last data filed at 08/06/2019 1617 Gross per 24 hour  Intake 3101.79 ml  Output 3365 ml  Net -263.21 ml    Constitutional :  alert, cooperative, appears stated age and mild distress  Respiratory:  clear to auscultation bilaterally  Cardiovascular:  regular rate and rhythm  Gastrointestinal: soft, focal guarding around incision, ostomy with darker, dusky mucus with patches of pink.  no output.  malecot drain with minimal mucus output.  NG with bilious output, IR drain with serosanguinous output.  staples intact, some serosanguinous drainge around retention sutures..   Skin: Cool and moist.    Psychiatric: Normal affect, non-agitated, not confused       LABS:  CMP Latest Ref Rng & Units 08/06/2019 08/05/2019 08/04/2019  Glucose 70 - 99 mg/dL 179(H) 130(H) 160(H)  BUN 6 - 20 mg/dL 10 12 9   Creatinine 0.44 - 1.00 mg/dL 0.39(L) 0.55 0.46  Sodium 135 - 145 mmol/L 133(L) 134(L) 135  Potassium 3.5 - 5.1 mmol/L 3.7 3.5 3.7  Chloride 98 - 111 mmol/L 99 99 101  CO2 22 - 32 mmol/L 26 26 24   Calcium 8.9 - 10.3 mg/dL 7.7(L) 8.4(L) 8.1(L)  Total Protein 6.5 - 8.1 g/dL - 7.7 -  Total Bilirubin 0.3 - 1.2 mg/dL - 0.6 -  Alkaline Phos 38 - 126 U/L - 102 -  AST 15 - 41 U/L - 41 -  ALT 0 - 44 U/L - 50(H) -   CBC Latest Ref Rng & Units 08/06/2019 08/05/2019 08/04/2019  WBC 4.0 - 10.5 K/uL 27.7(H) 22.6(H) 22.4(H)  Hemoglobin 12.0 - 15.0 g/dL 7.5(L) 8.2(L) 7.9(L)  Hematocrit 36.0 - 46.0 % 23.3(L) 25.7(L) 24.9(L)  Platelets 150 - 400 K/uL 1,005(HH) 1,337(HH) 1,004(HH)    RADS:  Assessment:   exploratory laparotomy and repair of small bowel enterotomy as well as hysterectomy with OB/GYN, repeat ex-lap and re-anastamosis for failed anastamosis. Reexploration for dehiscence, second anastamosis failure, abdominal washout, LOA, end ileostomy and drain placement.  Cont foley, stop IVF due to adequate urine output.  Continue abx, appreciate ID consult.  Better pain control with adding toradol.  Continue NG due to expected ileus.  Will need to continue to monitor ileostomy for viability.  Cont TPN for nutrition.  Patient high  risk for wound issues. Continue bedrest for now.  lovenox for DVT prophylaxis.  protonix for GI prophylaxis

## 2019-08-06 NOTE — Care Management (Signed)
Reported in progression rounds that patient has new ileostomy.  Patient would benefit from home health nursing at discharge.  Patient currently resting at this time. RNCM will meet with patient to discuss anticipated needs at discharge

## 2019-08-06 NOTE — Progress Notes (Signed)
Ch visited with Pt's husband to follow up after the RR last to last week, and procedures. Pt was asleep. Pt's husband reported that they have a good support system with family, and church. Ch let Pt's husband know about chaplain availability, and left.

## 2019-08-06 NOTE — Progress Notes (Addendum)
Nutrition Follow Up Note   DOCUMENTATION CODES:   Morbid obesity  INTERVENTION:    Continue Clinimix 5/20 with electrolytes @ 36m/hr x 24 hrs/day   Continue 20% lipids @30ml /hr x 12 hrs/day   Continue MVI and trace elements per shortage   Regimen at goal rate will provide 2472kcal/day, 100g/day protein, 23598mvolume    Daily weights   NUTRITION DIAGNOSIS:   Inadequate oral intake related to acute illness as evidenced by NPO status.  GOAL:   Patient will meet greater than or equal to 90% of their needs -met with TPN  MONITOR:   Diet advancement, Labs, Weight trends, Skin, I & O's, TPN  ASSESSMENT:   [age]/o female with uterine fibroids s/p laparoscopic converted to open total hysterectomy, left salpingectomy, cystoscopy and 25 cm small bowel resection (near distal ileum) secondary to bowel injury 3/8. Pt declined post operatively requiring exploratory laparotomy, lysis of adhesions, abdominal wound washout, resection of previous anastomosis with 23cm of small bowel removed and subsequent reanastomosis 3/14   Pt s/p emergent return to OR 3/22 for abdominal washout, LOA, end ileostomy and drain placement.  Pt tolerating TPN well at goal rate. Refeed labs stable. It is difficult to determine if any significant weight changes as pt's weights have had large fluctuations. Will plan to continue TPN until patient is able to tolerate oral diet.    Medications reviewed and include: risaquad, lovenox, insulin, synthroid, protonix, zosyn  Labs reviewed: Na 133(L), creat 0.39(L), Mg 1.8 wnl P 3.1 wnl- 3/22 Pre-albumin 13.6(L)- 3/22 Triglycerides 166(H)- 3/22 Wbc- 27.7(H), Hgb 7.5(L), Hct 23.3(L) cbgs- 181, 169 x 24 hrs  Diet Order:   Diet Order            Diet NPO time specified Except for: Sips with Meds  Diet effective now             EDUCATION NEEDS:   No education needs have been identified at this time  Skin:  Skin Assessment: Reviewed RN Assessment(incision  abdomen)  Last BM:  3/22- type 6  Height:   Ht Readings from Last 1 Encounters:  07/23/19 5' 2"  (1.575 m)    Weight:   Wt Readings from Last 1 Encounters:  08/06/19 108.2 kg    Ideal Body Weight:  50 kg  BMI:  Body mass index is 43.63 kg/m.  Estimated Nutritional Needs:   Kcal:  2400-2700kcal/day  Protein:  120-135g/day  Fluid:  1.8L/day  CaKoleen DistanceS, RD, LDN Contact information available in Amion

## 2019-08-06 NOTE — Progress Notes (Signed)
Hematology/Oncology Progress Note [facility] [phone] [phone]  Patient Care Team: Hortencia Pilar, MD as PCP - General (Family Medicine)   Name of the patient: Karen Aguirre  HO:[id number]  [birth year]  Date of visit: 08/06/19   INTERVAL HISTORY-  Status post IV Venofer 200 mg daily x 2 Status post additional procedure for exploratory laparotomy due to increased drainage from the midline. Mother-in-law at the bedside.  Patient sleeping  Review of systems- Review of Systems  Unable to perform ROS: Acuity of condition    Allergies  Allergen Reactions  . Cobalt Hives    Patient Active Problem List   Diagnosis Date Noted  . Thrombocytosis ([place])   . Absolute anemia   . Intra-abdominal fluid collection   . Fibroid uterus 07/22/2019  . Pelvic pain 07/22/2019  . S/P laparotomy 07/22/2019     Past Medical History:  Diagnosis Date  . Anxiety   . Hypothyroidism   . Thyroid disease      Past Surgical History:  Procedure Laterality Date  . BOWEL RESECTION N/A 07/22/2019   Procedure: SMALL BOWEL RESECTION;  Surgeon: Ward, Honor Loh, MD;  Location: [facility];  Service: Gynecology;  Laterality: N/A;  . BOWEL RESECTION  07/27/2019   Procedure: SMALL BOWEL RESECTION;  Surgeon: Ward, Honor Loh, MD;  Location: [facility];  Service: Gynecology;;  . CHOLECYSTECTOMY    . ECTOPIC PREGNANCY SURGERY    . HYSTERECTOMY ABDOMINAL WITH SALPINGECTOMY Bilateral 07/22/2019   Procedure: HYSTERECTOMY ABDOMINAL WITH lbilateral SALPINGECTOMY, removal of left anterior cul de sac nodule, cystoscopy;  Surgeon: Ward, Honor Loh, MD;  Location: [facility];  Service: Gynecology;  Laterality: Bilateral;  . ILEOSTOMY N/A 08/05/2019   Procedure: ILEOSTOMY;  Surgeon: Benjamine Sprague, DO;  Location: [facility];  Service: General;  Laterality: N/A;  . LAPAROTOMY N/A 07/22/2019   Procedure: LAPAROTOMY;  Surgeon: Ward, Honor Loh, MD;  Location: [facility];  Service: Gynecology;   Laterality: N/A;  . LAPAROTOMY N/A 07/27/2019   Procedure: EXPLORATORY LAPAROTOMY;  Surgeon: Ward, Honor Loh, MD;  Location: [facility];  Service: Gynecology;  Laterality: N/A;  . LAPAROTOMY N/A 08/05/2019   Procedure: EXPLORATORY LAPAROTOMY;  Surgeon: Benjamine Sprague, DO;  Location: [facility];  Service: General;  Laterality: N/A;  . ROBOTIC ASSISTED TOTAL HYSTERECTOMY WITH BILATERAL SALPINGO OOPHERECTOMY Bilateral 07/22/2019   Procedure: XI ROBOTIC ASSISTED TOTAL HYSTERECTOMY WITH BILATERAL SALPINGECTOMY _ATTEMPTED;  Surgeon: Ward, Honor Loh, MD;  Location: [facility];  Service: Gynecology;  Laterality: Bilateral;    Social History   Socioeconomic History  . Marital status: Married    Spouse name: Not on file  . Number of children: Not on file  . Years of education: Not on file  . Highest education level: Not on file  Occupational History  . Not on file  Tobacco Use  . Smoking status: Former Research scientist (life sciences)  . Smokeless tobacco: Never Used  . Tobacco comment: 20 years ago  Substance and Sexual Activity  . Alcohol use: Yes    Comment: ocassional  . Drug use: No  . Sexual activity: Not on file  Other Topics Concern  . Not on file  Social History Narrative  . Not on file   Social Determinants of Health   Financial Resource Strain:   . Difficulty of Paying Living Expenses:   Food Insecurity:   . Worried About Charity fundraiser in the Last Year:   . Arboriculturist in the Last Year:   Transportation Needs:   .  Lack of Transportation (Medical):   Marland Kitchen Lack of Transportation (Non-Medical):   Physical Activity:   . Days of Exercise per Week:   . Minutes of Exercise per Session:   Stress:   . Feeling of Stress :   Social Connections:   . Frequency of Communication with Friends and Family:   . Frequency of Social Gatherings with Friends and Family:   . Attends Religious Services:   . Active Member of Clubs or Organizations:   . Attends Archivist Meetings:   Marland Kitchen Marital Status:    Intimate Partner Violence:   . Fear of Current or Ex-Partner:   . Emotionally Abused:   Marland Kitchen Physically Abused:   . Sexually Abused:      History reviewed. No pertinent family history.   Current Facility-Administered Medications:  .  Marland KitchenTPN (CLINIMIX-E) Adult, , Intravenous, Continuous TPN, Sakai, Isami, DO, Last Rate: 83 mL/hr at 08/06/19 0500, Rate Verify at 08/06/19 0500 .  Marland KitchenTPN (CLINIMIX-E) Adult, , Intravenous, Continuous TPN, Sakai, Isami, DO .  0.9 %  sodium chloride infusion, , Intravenous, PRN, Lysle Pearl, Isami, DO, Last Rate: 10 mL/hr at 08/02/19 1532, 1,000 mL at 08/02/19 1532 .  acetaminophen (OFIRMEV) IV 1,000 mg, 1,000 mg, Intravenous, Q6H PRN, Piscoya, Jose, MD, Last Rate: 400 mL/hr at 08/05/19 2030, 1,000 mg at 08/05/19 2030 .  acidophilus (RISAQUAD) capsule 1 capsule, 1 capsule, Oral, Daily, Sakai, Isami, DO, 1 capsule at 08/06/19 0931 .  anidulafungin (ERAXIS) 100 mg in sodium chloride 0.9 % 100 mL IVPB, 100 mg, Intravenous, Q24H, Sakai, Isami, DO, Last Rate: 78 mL/hr at 08/06/19 0942, 100 mg at 08/06/19 0942 .  Chlorhexidine Gluconate Cloth 2 % PADS 6 each, 6 each, Topical, Daily, Sakai, Isami, DO, 6 each at 08/06/19 0958 .  enoxaparin (LOVENOX) injection 40 mg, 40 mg, Subcutaneous, Q12H, Sakai, Isami, DO, 40 mg at 08/06/19 0948 .  escitalopram (LEXAPRO) tablet 20 mg, 20 mg, Oral, Daily, Sakai, Isami, DO, 20 mg at 08/06/19 0931 .  Fat emulsion 20 % infusion 360 mL, 360 mL, Intravenous, Continuous TPN, Sakai, Isami, DO .  gabapentin (NEURONTIN) capsule 300 mg, 300 mg, Oral, QHS, Sakai, Isami, DO .  HYDROmorphone (DILAUDID) injection 0.5 mg, 0.5 mg, Intravenous, Q3H PRN, Sakai, Isami, DO, 0.5 mg at 08/06/19 1136 .  insulin aspart (novoLOG) injection 0-9 Units, 0-9 Units, Subcutaneous, Q6H, Sakai, Isami, DO, 2 Units at 08/06/19 1228 .  ipratropium-albuterol (DUONEB) 0.5-2.5 (3) MG/3ML nebulizer solution 3 mL, 3 mL, Nebulization, Q4H PRN, Sakai, Isami, DO, 3 mL at 07/28/19 1205 .   ketorolac (TORADOL) 30 MG/ML injection 30 mg, 30 mg, Intravenous, Q8H PRN, Sakai, Isami, DO, 30 mg at 08/06/19 0938 .  levothyroxine (SYNTHROID, LEVOTHROID) injection 62.5 mcg, 62.5 mcg, Intravenous, Daily, Sakai, Isami, DO, 62.5 mcg at 08/06/19 0934 .  LORazepam (ATIVAN) injection 1 mg, 1 mg, Intravenous, QHS PRN,MR X 1, Sakai, Isami, DO, 1 mg at 07/30/19 2148 .  menthol-cetylpyridinium (CEPACOL) lozenge 3 mg, 1 lozenge, Oral, Q2H PRN, Sakai, Isami, DO .  metoCLOPramide (REGLAN) injection 5 mg, 5 mg, Intravenous, Q8H PRN, Sakai, Isami, DO, 5 mg at 08/04/19 2017 .  [DISCONTINUED] ondansetron (ZOFRAN) tablet 4 mg, 4 mg, Oral, Q6H PRN **OR** ondansetron (ZOFRAN) injection 4 mg, 4 mg, Intravenous, Q4H PRN, Sakai, Isami, DO, 4 mg at 08/03/19 1744 .  pantoprazole (PROTONIX) injection 40 mg, 40 mg, Intravenous, QHS, Sakai, Isami, DO, 40 mg at 08/05/19 2142 .  piperacillin-tazobactam (ZOSYN) IVPB 3.375 g, 3.375 g, Intravenous, Q8H, Sakai, Isami,  DO, Last Rate: 12.5 mL/hr at 08/06/19 1230, 3.375 g at 08/06/19 1230 .  promethazine (PHENERGAN) injection 25 mg, 25 mg, Intravenous, Q6H PRN, Sakai, Isami, DO, 25 mg at 08/04/19 0620 .  scopolamine (TRANSDERM-SCOP) 1 MG/3DAYS 1.5 mg, 1 patch, Transdermal, Q72H, Sakai, Isami, DO, 1.5 mg at 08/03/19 1809 .  sodium chloride flush (NS) 0.9 % injection 10-40 mL, 10-40 mL, Intracatheter, Q12H, Sakai, Isami, DO, 10 mL at 08/06/19 0947 .  sodium chloride flush (NS) 0.9 % injection 10-40 mL, 10-40 mL, Intracatheter, PRN, Sakai, Isami, DO, 10 mL at 07/31/19 0544   Physical exam:  Vitals:   08/06/19 0959 08/06/19 1137 08/06/19 1140 08/06/19 1206  BP: 132/68 133/64    Pulse: (!) 114 (!) 108    Resp: (!) 24 18 18    Temp: 99.1 F (37.3 C) 98.5 F (36.9 C)    TempSrc: Oral Oral    SpO2: 93% 96%    Weight:    238 lb 8.6 oz (108.2 kg)  Height:       Physical Exam  Constitutional: No distress.  HENT:  NG tube  Pulmonary/Chest: Effort normal.  Neurological:   Sleeping  Skin: Skin is warm.       CMP Latest Ref Rng & Units 08/06/2019  Glucose 70 - 99 mg/dL 179(H)  BUN 6 - 20 mg/dL 10  Creatinine 0.44 - 1.00 mg/dL 0.39(L)  Sodium 135 - 145 mmol/L 133(L)  Potassium 3.5 - 5.1 mmol/L 3.7  Chloride 98 - 111 mmol/L 99  CO2 22 - 32 mmol/L 26  Calcium 8.9 - 10.3 mg/dL 7.7(L)  Total Protein 6.5 - 8.1 g/dL -  Total Bilirubin 0.3 - 1.2 mg/dL -  Alkaline Phos 38 - 126 U/L -  AST 15 - 41 U/L -  ALT 0 - 44 U/L -   CBC Latest Ref Rng & Units 08/06/2019  WBC 4.0 - 10.5 K/uL 27.7(H)  Hemoglobin 12.0 - 15.0 g/dL 7.5(L)  Hematocrit 36.0 - 46.0 % 23.3(L)  Platelets 150 - 400 K/uL 1,005(HH)    RADIOGRAPHIC STUDIES: I have personally reviewed the radiological images as listed and agreed with the findings in the report. DG Chest 1 View  Result Date: 07/28/2019 CLINICAL DATA:  Tachypnea, recent hysterectomy EXAM: CHEST  1 VIEW COMPARISON:  07/27/2019 chest radiograph. FINDINGS: Low lung volumes. Stable cardiomediastinal silhouette with normal heart size. No pneumothorax. No pleural effusion. Mild right basilar atelectasis, unchanged. No pulmonary edema. No acute consolidative airspace disease. IMPRESSION: Stable low lung volumes with mild right basilar atelectasis. Electronically Signed   By: Ilona Sorrel M.D.   On: 07/28/2019 16:45   DG Chest 1 View  Result Date: 07/27/2019 CLINICAL DATA:  Tachypnea.  Recent abdominal surgery. EXAM: CHEST  1 VIEW COMPARISON:  11/03/2010 FINDINGS: Lung volumes are low. There is opacity at the right lung base consistent with atelectasis. Remainder of the lungs is clear. No convincing pleural effusion and no pneumothorax. Cardiac silhouette is normal in size. No mediastinal or hilar masses. Skeletal structures are grossly intact. IMPRESSION: 1. No acute cardiopulmonary disease. 2. Mild right lung base atelectasis. Electronically Signed   By: Lajean Manes M.D.   On: 07/27/2019 16:15   DG Abd 1 View  Result Date:  07/22/2019 CLINICAL DATA:  Evaluate for possible retained sponge EXAM: ABDOMEN - 1 VIEW COMPARISON:  None. FINDINGS: Scattered large and small bowel gas is noted. No abnormal mass or abnormal calcifications are seen. Postsurgical changes are noted. No radiopaque foreign body is identified to suggest  retained sponge. IMPRESSION: No evidence of retained foreign body. These results will be called to the ordering clinician or representative by the Radiologist Assistant, and communication documented in the PACS or zVision Dashboard. Electronically Signed   By: Inez Catalina M.D.   On: 07/22/2019 22:49   [place] Abscess Drain  Result Date: 08/02/2019 INDICATION: 44 year old with recent hysterectomy and bowel surgery. Patient has elevated white blood cell count and small intra-abdominal fluid collections. EXAM: ULTRASOUND-GUIDED ASPIRATION OF ABDOMINAL FLUID MEDICATIONS: Fentanyl 25 mcg ANESTHESIA/SEDATION: The patient was continuously monitored during the procedure by the interventional radiology nurse under my direct supervision. COMPLICATIONS: None immediate. PROCEDURE: Informed written consent was obtained from the patient after a thorough discussion of the procedural risks, benefits and alternatives. All questions were addressed. A timeout was performed prior to the initiation of the procedure. Abdomen was evaluated with ultrasound. Pocket of fluid in the anterior right lower abdomen was targeted just medial to the existing percutaneous drain. Skin was prepped with chlorhexidine and sterile field was created. Skin and soft tissues were anesthetized with 1% lidocaine. Using ultrasound guidance, a Yueh catheter was directed into this peritoneal fluid. Only 3 mL of fluid could be obtained. Unable to aspirate majority of the fluid in this area. Bandage placed over the puncture site. FINDINGS: Small pocket of fluid along the right anterior lower abdomen just medial to the existing percutaneous drain. Needle position confirmed  within this small collection but only a small amount of slightly thick red fluid was removed. IMPRESSION: Ultrasound-guided aspiration of fluid from the right anterior abdomen. Only a small amount of slightly thick red fluid could be obtained. Findings could represent blood products or hematoma formation. Fluid was sent for culture. Electronically Signed   By: Markus Daft M.D.   On: 08/02/2019 17:11   [place] Abscess Drain  Result Date: 07/26/2019 INDICATION: History of hysterectomy complicated by small bowel injury requiring resection and primary anastomosis. CT scan of the abdomen and pelvis performed earlier today secondary to elevated white blood cell count and fever demonstrated indeterminate fluid collections within the abdomen with dominant collection within the right lower abdominal quadrant As such, request made for ultrasound-guided paracentesis versus drainage catheter placement for and diagnostic and potentially therapeutic purposes. EXAM: ULTRASOUND GUIDED ABSCESS DRAINAGE COMPARISON:  CT abdomen and pelvis - earlier same day MEDICATIONS: The patient is currently admitted to the hospital and receiving intravenous antibiotics. The antibiotics were administered within an appropriate time frame prior to the initiation of the procedure. ANESTHESIA/SEDATION: None CONTRAST:  None COMPLICATIONS: None immediate. PROCEDURE: Informed written consent was obtained from the patient after a discussion of the risks, benefits and alternatives to treatment. The patient was placed supine on her hospital bed and sonographic evaluation was performed of the abdomen demonstrating ill-defined fluid scattered throughout the abdomen with dominant collection within right lower abdominal quadrant measuring approximately 9.6 x 8.2 x 3.9 cm, correlating with the dominant collection seen on preceding abdominal CT image 66, series 2. The procedure was planned. A timeout was performed prior to the initiation of the procedure. The skin  overlying the right lower abdomen was prepped and draped in the usual sterile fashion. The overlying soft tissues were anesthetized with 1% lidocaine with epinephrine. Appropriate trajectory was planned with the use of a 22 gauge spinal needle. An 18 gauge trocar needle was advanced into the abscess/fluid collection and a short Amplatz super stiff wire was coiled within the collection. Multiple ultrasound images were saved for procedural documentation purposes Next, the track  was dilated ultimately allowing placement of a 10 [place] all-purpose drainage catheter. Next, approximately 150 cc of brown serous ascitic fluid was aspirated. A representative sample was capped and sent to the laboratory for analysis. The tube was connected to a drainage bag and sutured in place. A dressing was placed. The patient tolerated the procedure well without immediate post procedural complication. IMPRESSION: Successful ultrasound guided placement of a 10 [place] all purpose drain catheter into the dominant collection within the right lower abdomen with aspiration of 150 cc of brown, serous ascitic fluid. Samples were sent to the laboratory as requested by the ordering clinical team. Electronically Signed   By: Sandi Mariscal M.D.   On: 07/26/2019 17:30   CT ABDOMEN PELVIS W CONTRAST  Result Date: 07/31/2019 CLINICAL DATA:  44 yo F s/p exp. Lap and repair of small bowel enterotomy as well as hysterectomy with OB/GYN and surgery involved. Reexploration abdominal washout and resection of original anastomosis today 3/14. EXAM: CT ABDOMEN AND PELVIS WITH CONTRAST TECHNIQUE: Multidetector CT imaging of the abdomen and pelvis was performed using the standard protocol following bolus administration of intravenous contrast. CONTRAST:  165mL OMNIPAQUE IOHEXOL 300 MG/ML  SOLN COMPARISON:  07/26/2019 FINDINGS: Lower chest: Dependent atelectasis, greater on the right, similar to the prior CT. No acute findings. Hepatobiliary: No focal liver  abnormality is seen. Status post cholecystectomy. No biliary dilatation. Pancreas: Unremarkable. No pancreatic ductal dilatation or surrounding inflammatory changes. Spleen: Normal in size without focal abnormality. Adrenals/Urinary Tract: No adrenal masses. Stable low-density renal masses consistent with cysts on the left. Mild prominence of the right intrarenal collecting system. No renal or ureteral stones. No left renal collecting system dilation. Normal left ureter. Symmetric renal enhancement and excretion. Bladder minimally distended. Foley catheter in place. No bladder mass. Stomach/Bowel: Normal stomach. Mesenteric edema and inflammation most evident in the central to lower abdomen. Right lower quadrant percutaneously placed drainage catheter significantly decreases the size of the previously seen right lower quadrant collection. Residual collection measures 6.4 x 2.9 cm transversely. Small anterior less well-defined fluid collection is seen, to the right of midline, near the level of the umbilicus, 6.3 x 1.9 cm transversely. Fluid is noted within leaves of the small bowel mesentery most evident mid to lower abdomen, without additional defined collection. Bowel anastomosis staples are noted upper anterior pelvis extending to the right lower quadrant. No evidence of bowel obstruction. Several loops of central to lower abdomen small bowel demonstrate mild wall thickening. Colon is mostly decompressed. No colonic wall thickening. Vascular/Lymphatic: No vascular abnormality. Several prominent mesenteric lymph nodes most evident right lower quadrant, all subcentimeter and similar to the prior CT. Reproductive: Status post hysterectomy. No adnexal masses. Other: Anterior abdominal wall air adjacent to the midline incision. No hernia. No abdominal wall collection to suggest abscess. Small amount of ascites. Tiny bubbles of free intraperitoneal air are noted deep to the midline incision. Musculoskeletal: No acute  or significant abnormality. IMPRESSION: 1. No bowel obstruction. Mild small bowel wall thickening in the lower abdomen is likely reactive/edema from the recent surgery. 2. Significant improvement the right lower quadrant collection following placement of a percutaneous drainage catheter. 3. No new abdominal collections. Small amount of ascites and diffuse mesenteric edema most evident mid to lower abdomen. 4. Few tiny bubbles of anterior midline free air, presumed postsurgical. Electronically Signed   By: Lajean Manes M.D.   On: 07/31/2019 10:48   CT ABDOMEN PELVIS W CONTRAST  Result Date: 07/26/2019 CLINICAL DATA:  Abdominal pain  and tenderness. Leukocytosis. Recent small bowel resection and hysterectomy. EXAM: CT ABDOMEN AND PELVIS WITH CONTRAST TECHNIQUE: Multidetector CT imaging of the abdomen and pelvis was performed using the standard protocol following bolus administration of intravenous contrast. CONTRAST:  171mL OMNIPAQUE IOHEXOL 300 MG/ML  SOLN COMPARISON:  06/21/2019 FINDINGS: Lower Chest: New bibasilar atelectasis. Hepatobiliary: No hepatic masses identified. Prior cholecystectomy. No evidence of biliary obstruction. Pancreas:  No mass or inflammatory changes. Spleen: Within normal limits in size and appearance. Adrenals/Urinary Tract: Stable tiny bilateral renal cysts. No masses identified. No evidence of ureteral calculi or hydronephrosis. Stomach/Bowel: A small amount of extraperitoneal air in the lower pelvis, and intraperitoneal air, are consistent with recent surgery. No evidence of bowel obstruction. Mildly dilated small bowel loops are seen containing air-fluid levels and increased gaseous distention of colon is also seen, suspicious for adynamic ileus. Diffuse mesenteric inflammatory changes or edema are new since previous study. Multiple small loculated fluid intraperitoneal fluid collections are seen in the abdomen and pelvis, 1 in the right lower quadrant measuring 9.8 x 5.1 cm on image  66/2. Vascular/Lymphatic: No pathologically enlarged lymph nodes. No abdominal aortic aneurysm. Reproductive: Prior hysterectomy noted. Rim enhancing fluid collection seen in the hysterectomy bed which measures 8.0 x 7.4 cm on image 67/7. Other:  None. Musculoskeletal:  No suspicious bone lesions identified. IMPRESSION: 1. Small amount of intraperitoneal and extraperitoneal air, consistent with recent surgery. 2. Multiple loculated intraperitoneal fluid collections in the abdomen and pelvis, largest in the right lower quadrant measuring 9.8 cm and pelvic cul-de-sac measuring 8.0 cm, suspicious for abscesses. 3. Mild diffuse dilatation of small bowel and colon, suspicious for adynamic ileus. 4. New bibasilar atelectasis. Electronically Signed   By: Marlaine Hind M.D.   On: 07/26/2019 13:38   US Abdomen Limited  Result Date: 07/30/2019 CLINICAL DATA:  Drainage from abdominal wall incision site for hysterectomy. Evaluate for abscess. EXAM: LIMITED ULTRASOUND OF ABDOMINAL SOFT TISSUES TECHNIQUE: Ultrasound examination of the anterior abdominal wall soft tissues was performed in the area of clinical concern at the previous incision site. COMPARISON:  None. FINDINGS: A small fluid collection is seen within the subcutaneous tissues of the midline abdominal wall just above the umbilicus which measures 0.5 x 0.5 x 1.1 cm. A 2nd small complex fluid collection is seen just below the level of the umbilicus which measures 1.3 x 0.5 x 1.1 cm. IMPRESSION: Two small approximately 1 cm complex fluid collections are identified in the midline abdominal wall subcutaneous tissues. These may represent postop seromas or abscesses. Electronically Signed   By: Marlaine Hind M.D.   On: 07/30/2019 11:41   US Venous Img Lower Bilateral (DVT)  Result Date: 07/27/2019 CLINICAL DATA:  Tachycardia.  History of recent surgery. EXAM: BILATERAL LOWER EXTREMITY VENOUS DOPPLER ULTRASOUND TECHNIQUE: Gray-scale sonography with compression, as well  as color and duplex ultrasound, were performed to evaluate the deep venous system(s) from the level of the common femoral vein through the popliteal and proximal calf veins. COMPARISON:  None. FINDINGS: VENOUS Normal compressibility of the common femoral, superficial femoral, and popliteal veins, as well as the visualized calf veins. Visualized portions of profunda femoral vein and great saphenous vein unremarkable. No filling defects to suggest DVT on grayscale or color Doppler imaging. Doppler waveforms show normal direction of venous flow, normal respiratory phasicity and response to augmentation. Limited views of the contralateral common femoral vein are unremarkable. OTHER Edema noted in the legs bilaterally. Limitations: none IMPRESSION: No femoropopliteal DVT nor evidence of DVT within  the visualized calf veins. If clinical symptoms are inconsistent or if there are persistent or worsening symptoms, further imaging (possibly involving the iliac veins) may be warranted. Electronically Signed   By: Lajean Manes M.D.   On: 07/27/2019 16:16   [place] EKG SITE RITE  Result Date: 07/30/2019 If Site Rite image not attached, placement could not be confirmed due to current cardiac rhythm.   Assessment and plan-   #Thrombocytosis, likely secondary to acute infection/inflammation and underlying iron deficiency. Status post IV Venofer 200 mg x 2.  She tolerates well. Anemia, hemoglobin 7.5 continue monitor. Leukocytosis due to surgery/infection/inflammation.  No blast or schistocytes on peripheral smear. Continue broad-spectrum antibiotics including Zosyn, anidulafungin On TPN DVT prophylaxis with Lovenox. Thank you for allowing me to participate in the care of this patient.   Earlie Server, MD, PhD Hematology Oncology [facility] [place] Pager- [phone] 08/06/2019

## 2019-08-06 NOTE — Progress Notes (Signed)
[facility] INFECTIOUS DISEASE PROGRESS NOTE Date of Admission:  07/22/2019     ID: Karen Aguirre is a 44 y.o. female with  leukocytosis Active Problems:   Fibroid uterus   Pelvic pain   S/P laparotomy   Thrombocytosis (HCC)   Absolute anemia   Intra-abdominal fluid collection   Abdominal wall abscess   Subjective: Repeat surgery yesterday with findings of small bowel leak.  Febrile again. Wbc up to 27. Cultures sent with GS with abundant GNR and few GPR  ROS  Eleven systems are reviewed and negative except per hpi  Medications:  Antibiotics Given (last 72 hours)    Date/Time Action Medication Dose Rate   08/03/19 1645 New Bag/Given   piperacillin-tazobactam (ZOSYN) IVPB 3.375 g 3.375 g 12.5 mL/hr   08/03/19 2327 New Bag/Given   piperacillin-tazobactam (ZOSYN) IVPB 3.375 g 3.375 g 12.5 mL/hr   08/04/19 0854 New Bag/Given   piperacillin-tazobactam (ZOSYN) IVPB 3.375 g 3.375 g 12.5 mL/hr   08/04/19 1608 New Bag/Given   piperacillin-tazobactam (ZOSYN) IVPB 3.375 g 3.375 g 12.5 mL/hr   08/04/19 2320 New Bag/Given   piperacillin-tazobactam (ZOSYN) IVPB 3.375 g 3.375 g 12.5 mL/hr   08/05/19 1242 Given   piperacillin-tazobactam (ZOSYN) IVPB 3.375 g 3.375 g    08/05/19 1916 New Bag/Given   piperacillin-tazobactam (ZOSYN) IVPB 3.375 g 3.375 g 12.5 mL/hr   08/06/19 0348 New Bag/Given   piperacillin-tazobactam (ZOSYN) IVPB 3.375 g 3.375 g 12.5 mL/hr   08/06/19 1230 New Bag/Given   piperacillin-tazobactam (ZOSYN) IVPB 3.375 g 3.375 g 12.5 mL/hr     . acidophilus  1 capsule Oral Daily  . Chlorhexidine Gluconate Cloth  6 each Topical Daily  . enoxaparin (LOVENOX) injection  40 mg Subcutaneous Q12H  . escitalopram  20 mg Oral Daily  . gabapentin  300 mg Oral QHS  . insulin aspart  0-9 Units Subcutaneous Q6H  . levothyroxine  62.5 mcg Intravenous Daily  . pantoprazole (PROTONIX) IV  40 mg Intravenous QHS  . scopolamine  1 patch Transdermal Q72H  . sodium chloride flush  10-40  mL Intracatheter Q12H    Objective: Vital signs in last 24 hours: Temp:  [97 F (36.1 C)-101.3 F (38.5 C)] 98.5 F (36.9 C) (03/23 1137) Pulse Rate:  [108-135] 108 (03/23 1137) Resp:  [17-37] 18 (03/23 1140) BP: (96-134)/(48-71) 133/64 (03/23 1137) SpO2:  [93 %-98 %] 96 % (03/23 1137) Weight:  [108.2 kg] 108.2 kg (03/23 1206) Constitutional:  oriented to person, place, and time. Ill appearing  NGT in place HENT: [place]/AT, PERRLA, no scleral icterus Mouth/Throat: Oropharynx is clear and moist. No oropharyngeal exudate.  Cardiovascular: Normal rate, regular rhythm and normal heart sounds. Exam reveals no gallop and no friction rub.  No murmur heard.  Pulmonary/Chest: Effort normal and breath sounds normal. No respiratory distress.  has no wheezes.  Neck = supple, no nuchal rigidity Abdominal: Soft.  Midline incision covered post op. Mid line colostomy,   Perc drain in place with SS drainage. Lymphadenopathy: no cervical adenopathy. No axillary adenopathy Neurological: alert and oriented to person, place, and time.  Skin: Skin is warm and dry. No rash noted. No erythema.  R arm picc line in place. Psychiatric: a normal mood and affect.  behavior is normal.   Lab Results Recent Labs    08/05/19 0605 08/06/19 0640  WBC 22.6* 27.7*  HGB 8.2* 7.5*  HCT 25.7* 23.3*  NA 134* 133*  K 3.5 3.7  CL 99 99  CO2 26 26  BUN 12 10  CREATININE 0.55 0.39*    Microbiology: Results for orders placed or performed during the hospital encounter of 07/22/19  Urine Culture     Status: None   Collection Time: 07/23/19  4:00 PM   Specimen: Urine, Catheterized  Result Value Ref Range Status   Specimen Description   Final    URINE, CATHETERIZED Performed at [facility], [address] [place], [place] [address]    Special Requests   Final    NONE Performed at [facility], [address], [place], [place] [address]    Culture   Final    NO GROWTH Performed at [facility], [place] [address], [place], [place] [address]    Report Status 07/24/2019 FINAL  Final  Aerobic/Anaerobic Culture (surgical/deep wound)     Status: None   Collection Time: 07/26/19  4:42 PM   Specimen: PATH Other; Tissue  Result Value Ref Range Status   Specimen Description   Final    FLUID ABDOMEN RLQ Performed at [facility], [address], [place], [place] [address]    Special Requests   Final    NONE Performed at [facility], [address] [place], [place] [address]    Gram Stain   Final    FEW WBC PRESENT, PREDOMINANTLY PMN NO ORGANISMS SEEN    Culture   Final    No growth aerobically or anaerobically. Performed at [facility], [place] [address], [place], [place] [address]    Report Status 08/01/2019 FINAL  Final  Urine Culture     Status: None   Collection Time: 07/28/19 12:03 AM   Specimen: PATH Other; Urine  Result Value Ref Range Status   Specimen Description   Final    URINE, RANDOM Performed at [facility], [address], [place], [place] [address]    Special Requests   Final    NONE Performed at [facility], [address], [place], [place] [address]    Culture   Final    NO GROWTH Performed at [facility], [place] [address] [place], [place] [address]    Report Status 07/29/2019 FINAL  Final  Culture, blood (routine x 2)     Status: None   Collection Time: 07/30/19  6:02 PM   Specimen: BLOOD  Result Value Ref Range Status   Specimen Description BLOOD ALIN  Final   Special Requests   Final    BOTTLES DRAWN AEROBIC AND ANAEROBIC Blood Culture results may not be optimal due to an excessive volume of blood received in culture bottles   Culture   Final    NO GROWTH 5 DAYS Performed at [facility], [address] [place], [place] [address]    Report Status 08/04/2019 FINAL  Final  Culture, blood (routine x 2)     Status: None   Collection Time: 07/30/19  7:02 PM   Specimen:  BLOOD  Result Value Ref Range Status   Specimen Description BLOOD PORTA CATH  Final   Special Requests   Final    BOTTLES DRAWN AEROBIC AND ANAEROBIC Blood Culture results may not be optimal due to an excessive volume of blood received in culture bottles   Culture   Final    NO GROWTH 5 DAYS Performed at [facility], [address], [place],  [address]    Report Status 08/04/2019 FINAL  Final  C difficile quick scan w PCR reflex  Status: None   Collection Time: 07/31/19  4:38 PM   Specimen: STOOL  Result Value Ref Range Status   C Diff antigen NEGATIVE NEGATIVE Final   C Diff toxin NEGATIVE NEGATIVE Final   C Diff interpretation No C. difficile detected.  Final    Comment: Performed at [facility], [address] [place], [place] [address]  Aerobic Culture (superficial specimen)     Status: None   Collection Time: 07/31/19 10:18 PM   Specimen: JP Drain; Abdominal Fluid  Result Value Ref Range Status   Specimen Description   Final    JP DRAINAGE Performed at [facility], [address], [place], [place] [address]    Special Requests   Final    Normal Performed at [facility], [address] [place], [place] [address]    Gram Stain   Final    FEW WBC PRESENT,BOTH PMN AND MONONUCLEAR NO ORGANISMS SEEN Performed at [facility], [place] [address], [place], [place] [address]    Culture RARE ESCHERICHIA COLI  Final   Report Status 08/04/2019 FINAL  Final   Organism ID, Bacteria ESCHERICHIA COLI  Final      Susceptibility   Escherichia coli - MIC*    AMPICILLIN >=32 RESISTANT Resistant     CEFAZOLIN 16 SENSITIVE Sensitive     CEFEPIME <=0.12 SENSITIVE Sensitive     CEFTAZIDIME <=1 SENSITIVE Sensitive     CEFTRIAXONE <=0.25 SENSITIVE Sensitive     CIPROFLOXACIN <=0.25 SENSITIVE Sensitive     GENTAMICIN <=1 SENSITIVE Sensitive     IMIPENEM <=0.25 SENSITIVE Sensitive     TRIMETH/SULFA <=20 SENSITIVE Sensitive      AMPICILLIN/SULBACTAM >=32 RESISTANT Resistant     PIP/TAZO <=4 SENSITIVE Sensitive     * RARE ESCHERICHIA COLI  Body fluid culture     Status: None   Collection Time: 08/02/19  4:10 PM   Specimen: Abdomen; Body Fluid  Result Value Ref Range Status   Specimen Description   Final    ABDOMEN Performed at [facility], [address], [place], [place] [address]    Special Requests   Final    NONE Performed at [facility], [address] [place], [place] [address]    Gram Stain   Final    FEW WBC PRESENT, PREDOMINANTLY PMN NO ORGANISMS SEEN Performed at [facility], [place] [address], [place], [place] [address]    Culture NO GROWTH  Final   Report Status 08/06/2019 FINAL  Final  Aerobic/Anaerobic Culture (surgical/deep wound)     Status: None (Preliminary result)   Collection Time: 08/05/19 10:40 AM   Specimen: PATH GI Other; Tissue  Result Value Ref Range Status   Specimen Description   Final    TISSUE Performed at [facility], [address], [place], [place] [address]    Special Requests PATH GI  Final   Gram Stain   Final    RARE WBC PRESENT, PREDOMINANTLY PMN ABUNDANT GRAM NEGATIVE RODS FEW GRAM POSITIVE RODS Performed at [facility], [place] [address] [place],  [address]    Culture FEW GRAM NEGATIVE RODS  Final   Report Status PENDING  Incomplete    Studies/Results: No results found.  Assessment/Plan: SABRIE MENZIES is a 44 y.o. female with complicated surgical history since 3/8 TAH complicated by SB perf and repair, with anastomoses leak and repeat surgery 3/14. Has persistent leukocytosis despite zosyn. No fevers. Possible sources include line  infection, wound infection, intraabdominal source, TPN associated fungal infection, UTI.  3/18 feels better, wbc down some. Up and walking 3/19 - wbc up some, nausea 3/23 - s/p surg yest with SB leak noted. Has fevers again. Gram stain from abd with GNR and GPR. Cxs  penging Recommendations [place]. Fevers likely due to SB leak and surgery but will monitor Cont zosyn but if worsens can change to mero to cover for possible ESBL pending culture results and add dapto for possible VRE but no resistant organisms noted. Will cont eraxis.  Repeat bcx if febrile again as has line in place. Thank you very much for the consult. Will follow with you.  Leonel Ramsay   08/06/2019, 2:48 PM

## 2019-08-07 LAB — FOLATE: Folate: 4.2 ng/mL — ABNORMAL LOW (ref >5.9)

## 2019-08-07 LAB — MAGNESIUM: Magnesium: 2 mg/dL (ref 1.7–2.4)

## 2019-08-07 LAB — HEMOGLOBIN AND HEMATOCRIT, BLOOD
HCT: 20.7 % — ABNORMAL LOW (ref 36.0–46.0)
HCT: 23.9 % — ABNORMAL LOW (ref 36.0–46.0)
Hemoglobin: 5.7 g/dL — ABNORMAL LOW (ref 12.0–15.0)
Hemoglobin: 7.5 g/dL — ABNORMAL LOW (ref 12.0–15.0)

## 2019-08-07 LAB — GLUCOSE, CAPILLARY
Glucose-Capillary: 105 mg/dL — ABNORMAL HIGH (ref 70–99)
Glucose-Capillary: 114 mg/dL — ABNORMAL HIGH (ref 70–99)
Glucose-Capillary: 120 mg/dL — ABNORMAL HIGH (ref 70–99)
Glucose-Capillary: 141 mg/dL — ABNORMAL HIGH (ref 70–99)

## 2019-08-07 LAB — CBC WITH DIFFERENTIAL/PLATELET
Abs Immature Granulocytes: 1.22 10*3/uL — ABNORMAL HIGH (ref 0.00–0.07)
Basophils Absolute: 0.1 10*3/uL (ref 0.0–0.1)
Basophils Relative: 0 %
Eosinophils Absolute: 0.4 10*3/uL (ref 0.0–0.5)
Eosinophils Relative: 2 %
HCT: 20.8 % — ABNORMAL LOW (ref 36.0–46.0)
Hemoglobin: 6.5 g/dL — ABNORMAL LOW (ref 12.0–15.0)
Immature Granulocytes: 5 %
Lymphocytes Relative: 15 %
Lymphs Abs: 3.5 10*3/uL (ref 0.7–4.0)
MCH: 29.4 pg (ref 26.0–34.0)
MCHC: 31.3 g/dL (ref 30.0–36.0)
MCV: 94.1 fL (ref 80.0–100.0)
Monocytes Absolute: 1.9 10*3/uL — ABNORMAL HIGH (ref 0.1–1.0)
Monocytes Relative: 8 %
Neutro Abs: 16.5 10*3/uL — ABNORMAL HIGH (ref 1.7–7.7)
Neutrophils Relative %: 70 %
Platelets: 1006 10*3/uL (ref 150–400)
RBC: 2.21 MIL/uL — ABNORMAL LOW (ref 3.87–5.11)
RDW: 14.4 % (ref 11.5–15.5)
WBC: 23.6 10*3/uL — ABNORMAL HIGH (ref 4.0–10.5)
nRBC: 0.2 % (ref 0.0–0.2)

## 2019-08-07 LAB — BASIC METABOLIC PANEL
Anion gap: 8 (ref 5–15)
BUN: 11 mg/dL (ref 6–20)
CO2: 27 mmol/L (ref 22–32)
Calcium: 7.8 mg/dL — ABNORMAL LOW (ref 8.9–10.3)
Chloride: 100 mmol/L (ref 98–111)
Creatinine, Ser: 0.54 mg/dL (ref 0.44–1.00)
GFR calc Af Amer: >60 mL/min (ref >60)
GFR calc non Af Amer: >60 mL/min (ref >60)
Glucose, Bld: 129 mg/dL — ABNORMAL HIGH (ref 70–99)
Potassium: 3.8 mmol/L (ref 3.5–5.1)
Sodium: 135 mmol/L (ref 135–145)

## 2019-08-07 LAB — PREPARE RBC (CROSSMATCH)

## 2019-08-07 LAB — SURGICAL PATHOLOGY

## 2019-08-07 LAB — PHOSPHORUS: Phosphorus: 2.7 mg/dL (ref 2.5–4.6)

## 2019-08-07 LAB — VITAMIN B12: Vitamin B-12: 184 pg/mL (ref 180–914)

## 2019-08-07 MED ORDER — HYDROMORPHONE HCL 1 MG/ML IJ SOLN
INTRAMUSCULAR | Status: AC
  Filled 2019-08-07: qty 1

## 2019-08-07 MED ORDER — FAT EMULSION PLANT BASED 20 % IV EMUL
360.0000 mL | INTRAVENOUS | Status: AC
  Administered 2019-08-07: 360 mL via INTRAVENOUS
  Filled 2019-08-07: qty 360

## 2019-08-07 MED ORDER — SODIUM CHLORIDE 0.9% IV SOLUTION: Freq: Once | INTRAVENOUS | Status: DC

## 2019-08-07 MED ORDER — TRACE MINERALS CU-MN-SE-ZN 300-55-60-3000 MCG/ML IV SOLN: INTRAVENOUS | Status: DC

## 2019-08-07 MED ORDER — SODIUM CHLORIDE 0.9 % IV SOLN
200.0000 mg | Freq: Once | INTRAVENOUS | Status: AC
  Administered 2019-08-07: 200 mg via INTRAVENOUS
  Filled 2019-08-07: qty 10

## 2019-08-07 MED ORDER — SODIUM CHLORIDE 0.9 % IV SOLN
1.0000 g | Freq: Three times a day (TID) | INTRAVENOUS | Status: DC
  Administered 2019-08-07 – 2019-08-16 (×27): 1 g via INTRAVENOUS
  Filled 2019-08-07 (×31): qty 1

## 2019-08-07 MED ORDER — HYDROMORPHONE HCL 1 MG/ML IJ SOLN
INTRAMUSCULAR | Status: AC
  Administered 2019-08-07: 0.5 mg
  Filled 2019-08-07: qty 1

## 2019-08-07 MED ORDER — CLINIMIX E/DEXTROSE (5/20) 5 % IV SOLN
INTRAVENOUS | Status: AC
  Filled 2019-08-07: qty 400

## 2019-08-07 MED ORDER — DIPHENHYDRAMINE HCL 50 MG/ML IJ SOLN
12.5000 mg | Freq: Four times a day (QID) | INTRAMUSCULAR | Status: DC | PRN
  Administered 2019-08-07 – 2019-08-11 (×3): 12.5 mg via INTRAVENOUS
  Filled 2019-08-07 (×3): qty 1

## 2019-08-07 MED ORDER — TRACE MINERALS CU-MN-SE-ZN 300-55-60-3000 MCG/ML IV SOLN
INTRAVENOUS | Status: AC
  Filled 2019-08-07: qty 2000

## 2019-08-07 NOTE — Progress Notes (Addendum)
Nutrition Follow Up Note   DOCUMENTATION CODES:   Morbid obesity  INTERVENTION:    Increase Clinimix 5/20 with electrolytes to 112m/hr x 24 hrs/day   Continue 20% lipids @30ml /hr x 12 hrs/day   Continue MVI and trace elements per shortage   Regimen will provide 2832kcal/day, 120g/day protein, 27616mvolume    Daily weights   NUTRITION DIAGNOSIS:   Inadequate oral intake related to acute illness as evidenced by NPO status.  GOAL:   Patient will meet greater than or equal to 90% of their needs -met with TPN  MONITOR:   Diet advancement, Labs, Weight trends, Skin, I & O's, TPN  ASSESSMENT:   [age]/o female with uterine fibroids s/p laparoscopic converted to open total hysterectomy, left salpingectomy, cystoscopy and 25 cm small bowel resection (near distal ileum) secondary to bowel injury 3/8. Pt declined post operatively requiring exploratory laparotomy, lysis of adhesions, abdominal wound washout, resection of previous anastomosis with 23cm of small bowel removed and subsequent reanastomosis 3/14   Pt s/p emergent return to OR 3/22 for abdominal washout, LOA, end ileostomy and drain placement.  Pt tolerating TPN well at goal rate. Refeed labs stable. It is difficult to determine if any significant weight changes as pt's weights have had large fluctuations. No edema noted. UOP 52571mPt +13L on her I & Os. Drains with 270m89mtput. NGT in place. Will plan to increase TPN rate today to provide patient with additional protein needed for healing. Will monitor volume status closely.   Medications reviewed and include: risaquad, lovenox, insulin, synthroid, protonix, iron sucrose, meropenem  Labs reviewed: Na 135 wnl, P 2.7 wnl, Mg 2.0 wnl Pre-albumin 13.6(L)- 3/22 Triglycerides 166(H)- 3/22 Wbc- 23.6(H), Hgb 6.5(L), Hct 20.8(L) cbgs- 181, 169, 157, 144, 114 x 24 hrs  Diet Order:   Diet Order            Diet NPO time specified Except for: Sips with Meds  Diet effective now              EDUCATION NEEDS:   No education needs have been identified at this time  Skin:  Skin Assessment: Reviewed RN Assessment(incision abdomen)  Last BM:  3/24- 50ml61mput via ileostomy  Height:   Ht Readings from Last 1 Encounters:  07/23/19 5' 2"  (1.575 m)    Weight:   Wt Readings from Last 1 Encounters:  08/07/19 106 kg    Ideal Body Weight:  50 kg  BMI:  Body mass index is 42.74 kg/m.  Estimated Nutritional Needs:   Kcal:  2400-2700kcal/day  Protein:  120-135g/day  Fluid:  1.8L/day  CaseyKoleen DistanceRD, LDN Contact information available in Amion

## 2019-08-07 NOTE — Progress Notes (Signed)
Patients Hgb up to 7.5 at this point provider called and aware. Will continue to monitor

## 2019-08-07 NOTE — Progress Notes (Signed)
Subjective:  CC: Karen Aguirre is a 44 y.o. female  Hospital stay day 16,  exploratory laparotomy and repair of small bowel enterotomy as well as hysterectomy with OB/GYN, repeat ex-lap and re-anastamosis for failed anastamosis  2 Days Post-Op  Reexploration abdominal washout, LOA, end ileostomy and drain placement.  HPI:  No acute issues overnight.  Pain slightly better today.  Ostomy has some output  ROS:  General: Denies weight loss, weight gain, fatigue, fevers, chills, and night sweats. Heart: Denies chest pain, palpitations, racing heart, irregular heartbeat, leg pain or swelling, and decreased activity tolerance. Respiratory: Denies breathing difficulty, shortness of breath, wheezing, cough, and sputum. GI: Denies change in appetite, heartburn, constipation, diarrhea, and blood in stool. GU: Denies difficulty urinating, pain with urinating, urgency, frequency, blood in urine.   Objective:   Temp:  [97.6 F (36.4 C)-99.4 F (37.4 C)] 97.6 F (36.4 C) (03/24 0536) Pulse Rate:  [101-114] 108 (03/24 0536) Resp:  [12-24] 16 (03/24 0536) BP: (111-144)/(59-68) 117/59 (03/24 0536) SpO2:  [93 %-98 %] 95 % (03/24 0536) Weight:  [106 kg-108.2 kg] 106 kg (03/24 0212)     Height: 5\' 2"  (157.5 cm) Weight: 106 kg BMI (Calculated): 42.73   Intake/Output this shift:   Intake/Output Summary (Last 24 hours) at 08/07/2019 0830 Last data filed at 08/07/2019 [id number] Gross per 24 hour  Intake 1297.93 ml  Output 1525 ml  Net -227.07 ml    Constitutional :  alert, cooperative, appears stated age and mild distress  Respiratory:  clear to auscultation bilaterally  Cardiovascular:  regular rate and rhythm  Gastrointestinal: soft, focal guarding around incision, ostomy still dark, dusky mucus with patches of pink.  Thin bilious output in bag.  malecot drain with minimal mucus output.  NG with thin bilious output, IR drain with serosanguinous output.  staples intact, some serosanguinous drainge  around retention sutures.  Skin: Cool and moist.   Psychiatric: Normal affect, non-agitated, not confused       LABS:  CMP Latest Ref Rng & Units 08/07/2019 08/06/2019 08/05/2019  Glucose 70 - 99 mg/dL 129(H) 179(H) 130(H)  BUN 6 - 20 mg/dL 11 10 12   Creatinine 0.44 - 1.00 mg/dL 0.54 0.39(L) 0.55  Sodium 135 - 145 mmol/L 135 133(L) 134(L)  Potassium 3.5 - 5.1 mmol/L 3.8 3.7 3.5  Chloride 98 - 111 mmol/L 100 99 99  CO2 22 - 32 mmol/L 27 26 26   Calcium 8.9 - 10.3 mg/dL 7.8(L) 7.7(L) 8.4(L)  Total Protein 6.5 - 8.1 g/dL - - 7.7  Total Bilirubin 0.3 - 1.2 mg/dL - - 0.6  Alkaline Phos 38 - 126 U/L - - 102  AST 15 - 41 U/L - - 41  ALT 0 - 44 U/L - - 50(H)   CBC Latest Ref Rng & Units 08/07/2019 08/06/2019 08/05/2019  WBC 4.0 - 10.5 K/uL 23.6(H) 27.7(H) 22.6(H)  Hemoglobin 12.0 - 15.0 g/dL 6.5(L) 7.5(L) 8.2(L)  Hematocrit 36.0 - 46.0 % 20.8(L) 23.3(L) 25.7(L)  Platelets 150 - 400 K/uL 1,006(HH) 1,005(HH) 1,337(HH)    RADS: n/a Assessment:   exploratory laparotomy and repair of small bowel enterotomy as well as hysterectomy with OB/GYN, repeat ex-lap and re-anastamosis for failed anastamosis. Reexploration for dehiscence, second anastamosis failure, abdominal washout, LOA, end ileostomy and drain placement.  Ostomy productive but still dusky.  We will continue to monitor for viability.  Pain slightly better controlled now, continue current regimen.  TPN continued until adequate return of bowel function is noted and ostomy looking better.  Drain output minimal as expected.  IR drain showing no signs of active bleeding even though hemoglobin has dropped again today.  We will repeat in the afternoon to see if transfusion is needed.  Continue IV antibiotics for now, appreciate ID consult.

## 2019-08-07 NOTE — Progress Notes (Signed)
PHARMACY - TOTAL PARENTERAL NUTRITION CONSULT NOTE   Indication:  s/p laparoscopic converted to open total hysterectomy, left salpingectomy, cystoscopy and small bowel resection (near distal ileum) secondary to bowel injury 3/8. Pt declined post operatively requiring another exploratory laparotomy, lysis of adhesions, abdominal wound washout, resection of previous anastomosis with a portion small bowel removed and subsequent reanastomosis 3/14, now  second anastamosis failure and revision 3/23  Patient Measurements: Height: 5\' 2"  (157.5 cm) Weight: 233 lb 11 oz (106 kg) IBW/kg (Calculated) : 50.1 TPN AdjBW (KG): 70.9 Body mass index is 42.74 kg/m.  Assessment: 44 y.o. female  s/p exploratory laparotomy and repair of small bowel enterotomy as well as hysterectomy with OB/GYN  Post-Op re-exploration abdominal washout and resection of original anastomosis x 2, hospital day #16  Glucose / Insulin: BG 114-169, continue SSI q6h, 7 units/ 24 hrs  Electrolytes:   WNL Renal: Scr <1, stable LFTs / TGs: TG 198-->166 Prealbumin / albumin: 13.6/2.6 Intake / Output; MIVF:  GI Imaging: Surgeries / Procedures:   Central access:  07/30/19 TPN start date: 07/30/19  Nutritional Goals per RD recommendation revised 3/24 Kcal:  2832kcal/day Protein:  120g/day Goal TPN rate is 100 mL/hr (Regimen at goal rate will provide 2832kcal/day, 100g/day protein, 2760 ml volume )  Fluid: none  ABX: [place] 3/19>> Zosyn 3/12 >>  Current Nutrition:  NPO  Plan:   increase Clinimix 5/20 with electrolytes to new rate of 100 ml/hr  Add MVI on Mondays and Thursdays, trace elements daily  Continue 20% lipids @30ml /hr x 12 hrs/day   Continue sensitive SSI q6h and adjust as needed   Monitor TPN labs on Mon/Thurs, per protocol  Dallie Piles, PharmD, BCPS Clinical Pharmacist 08/07/2019 7:28 AM

## 2019-08-07 NOTE — Progress Notes (Signed)
Patient got up out of bed while getting a bath. Tolerated standing well and pain control is getting better with maximum pain level of 6.  Will reach out to day shift surgeon regarding patient's Hgb of 6.5.  Will continue to monitor.  Christene Slates

## 2019-08-07 NOTE — Progress Notes (Signed)
Subjective:  CC: Karen Aguirre is a 44 y.o. female  Hospital stay day 16,  exploratory laparotomy and repair of small bowel enterotomy as well as hysterectomy with OB/GYN, repeat ex-lap and re-anastamosis for failed anastamosis  2 Days Post-Op  Reexploration abdominal washout, LOA, end ileostomy and drain placement.  HPI: More ostomy output noted since exam earlier this morning.  No other issues noted.  ROS:  General: Denies weight loss, weight gain, fatigue, fevers, chills, and night sweats. Heart: Denies chest pain, palpitations, racing heart, irregular heartbeat, leg pain or swelling, and decreased activity tolerance. Respiratory: Denies breathing difficulty, shortness of breath, wheezing, cough, and sputum. GI: Denies change in appetite, heartburn, constipation, diarrhea, and blood in stool. GU: Denies difficulty urinating, pain with urinating, urgency, frequency, blood in urine.   Objective:   Temp:  [97.6 F (36.4 C)-99.2 F (37.3 C)] 98.9 F (37.2 C) (03/24 1444) Pulse Rate:  [101-110] 105 (03/24 1444) Resp:  [12-18] 18 (03/24 1444) BP: (111-144)/(56-66) 113/56 (03/24 1444) SpO2:  [94 %-98 %] 95 % (03/24 1444) Weight:  FV:[id number] kg] 106 kg (03/24 0212)     Height: 5\' 2"  (157.5 cm) Weight: 106 kg BMI (Calculated): 42.73   Intake/Output this shift:   Intake/Output Summary (Last 24 hours) at 08/07/2019 1643 Last data filed at 08/07/2019 1600 Gross per 24 hour  Intake 3025.61 ml  Output 2260 ml  Net 765.61 ml    Constitutional :  alert, cooperative, appears stated age and mild distress  Respiratory:  clear to auscultation bilaterally  Cardiovascular:  regular rate and rhythm  Gastrointestinal: soft, focal guarding around incision, ostomy still dark, dusky mucus with patches of pink.  Thin bilious output in bag.  malecot drain with minimal mucus output.  NG with thin bilious output, IR drain with serosanguinous output.  staples intact, some serosanguinous drainge around  retention sutures.  Skin: Cool and moist.   Psychiatric: Normal affect, non-agitated, not confused       LABS:  CMP Latest Ref Rng & Units 08/07/2019 08/06/2019 08/05/2019  Glucose 70 - 99 mg/dL 129(H) 179(H) 130(H)  BUN 6 - 20 mg/dL 11 10 12   Creatinine 0.44 - 1.00 mg/dL 0.54 0.39(L) 0.55  Sodium 135 - 145 mmol/L 135 133(L) 134(L)  Potassium 3.5 - 5.1 mmol/L 3.8 3.7 3.5  Chloride 98 - 111 mmol/L 100 99 99  CO2 22 - 32 mmol/L 27 26 26   Calcium 8.9 - 10.3 mg/dL 7.8(L) 7.7(L) 8.4(L)  Total Protein 6.5 - 8.1 g/dL - - 7.7  Total Bilirubin 0.3 - 1.2 mg/dL - - 0.6  Alkaline Phos 38 - 126 U/L - - 102  AST 15 - 41 U/L - - 41  ALT 0 - 44 U/L - - 50(H)   CBC Latest Ref Rng & Units 08/07/2019 08/07/2019 08/06/2019  WBC 4.0 - 10.5 K/uL - 23.6(H) 27.7(H)  Hemoglobin 12.0 - 15.0 g/dL 5.7(L) 6.5(L) 7.5(L)  Hematocrit 36.0 - 46.0 % 20.7(L) 20.8(L) 23.3(L)  Platelets 150 - 400 K/uL - 1,006(HH) 1,005(HH)    RADS: n/a Assessment:   exploratory laparotomy and repair of small bowel enterotomy as well as hysterectomy with OB/GYN, repeat ex-lap and re-anastamosis for failed anastamosis. Reexploration for dehiscence, second anastamosis failure, abdominal washout, LOA, end ileostomy and drain placement.  Ostomy now very productive and mucosal coloring has improved since this AM.  Very encouraging.  Start ice chips for pleasure feeds but will continue with NG tube decompression at least until tomorrow.  We will continue to monitor  for viability.    Pain level slowly but surely improving.  TPN continued until adequate return of bowel function is noted and ostomy continue to look better.   Drain output slightly more since this a.m., remains mainly serous..  IR drain showing no signs of active bleeding even though hemoglobin has dropped with repeat draw this afternoon.  We will proceed with transfusion of 1 unit PRBC as discussed with oncology.  Appreciate their recs.  Hold Toradol and Lovenox until hemoglobin  stabilizes.  Again, no obvious signs of active bleeding noted on physical exam including from the wound and drain and ostomy sites.  Results came back with some resistant organisms, ID extended antibiotics coverage to [place].  Appreciate their expertise.  Patient and husband updated with plans at bedside and they are all in agreement.  Questions and concerns addressed at this time.

## 2019-08-07 NOTE — Progress Notes (Addendum)
Hematology/Oncology Progress Note [facility] Telephone:([phone] [phone]  Patient Care Team: Hortencia Pilar, MD as PCP - General (Family Medicine)   Name of the patient: Karen Aguirre  EU:[id number]  Aug 16, 1975  Date of visit: 08/07/19   INTERVAL HISTORY-  Status post IV Venofer 200 mg daily x 3 Status post additional procedure for exploratory laparotomy due to increased drainage from the midline. Patient reports some itchiness after receiving Venofer treatment this morning, got a Benadryl and the symptom has improved.  Review of systems- Review of Systems  Unable to perform ROS: Acuity of condition  Constitutional: Negative for appetite change, chills, fatigue and fever.  HENT:   Negative for hearing loss and voice change.   Eyes: Negative for eye problems.  Respiratory: Negative for chest tightness and cough.   Cardiovascular: Negative for chest pain.  Gastrointestinal: Negative for abdominal distention, abdominal pain and blood in stool.  Endocrine: Negative for hot flashes.  Genitourinary: Negative for difficulty urinating and frequency.   Musculoskeletal: Negative for arthralgias.  Skin: Negative for itching and rash.  Neurological: Negative for extremity weakness.  Hematological: Negative for adenopathy.  Psychiatric/Behavioral: Negative for confusion.    Allergies  Allergen Reactions  . Cobalt Hives    Patient Active Problem List   Diagnosis Date Noted  . Abdominal wall abscess   . Thrombocytosis ([place])   . Absolute anemia   . Intra-abdominal fluid collection   . Fibroid uterus 07/22/2019  . Pelvic pain 07/22/2019  . S/P laparotomy 07/22/2019     Past Medical History:  Diagnosis Date  . Anxiety   . Hypothyroidism   . Thyroid disease      Past Surgical History:  Procedure Laterality Date  . BOWEL RESECTION N/A 07/22/2019   Procedure: SMALL BOWEL RESECTION;  Surgeon: Ward, Honor Loh, MD;  Location: [facility];   Service: Gynecology;  Laterality: N/A;  . BOWEL RESECTION  07/27/2019   Procedure: SMALL BOWEL RESECTION;  Surgeon: Ward, Honor Loh, MD;  Location: [facility];  Service: Gynecology;;  . CHOLECYSTECTOMY    . ECTOPIC PREGNANCY SURGERY    . HYSTERECTOMY ABDOMINAL WITH SALPINGECTOMY Bilateral 07/22/2019   Procedure: HYSTERECTOMY ABDOMINAL WITH lbilateral SALPINGECTOMY, removal of left anterior cul de sac nodule, cystoscopy;  Surgeon: Ward, Honor Loh, MD;  Location: [facility];  Service: Gynecology;  Laterality: Bilateral;  . ILEOSTOMY N/A 08/05/2019   Procedure: ILEOSTOMY;  Surgeon: Benjamine Sprague, DO;  Location: [facility];  Service: General;  Laterality: N/A;  . LAPAROTOMY N/A 07/22/2019   Procedure: LAPAROTOMY;  Surgeon: Ward, Honor Loh, MD;  Location: [facility];  Service: Gynecology;  Laterality: N/A;  . LAPAROTOMY N/A 07/27/2019   Procedure: EXPLORATORY LAPAROTOMY;  Surgeon: Ward, Honor Loh, MD;  Location: [facility];  Service: Gynecology;  Laterality: N/A;  . LAPAROTOMY N/A 08/05/2019   Procedure: EXPLORATORY LAPAROTOMY;  Surgeon: Benjamine Sprague, DO;  Location: [facility];  Service: General;  Laterality: N/A;  . ROBOTIC ASSISTED TOTAL HYSTERECTOMY WITH BILATERAL SALPINGO OOPHERECTOMY Bilateral 07/22/2019   Procedure: XI ROBOTIC ASSISTED TOTAL HYSTERECTOMY WITH BILATERAL SALPINGECTOMY _ATTEMPTED;  Surgeon: Ward, Honor Loh, MD;  Location: [facility];  Service: Gynecology;  Laterality: Bilateral;    Social History   Socioeconomic History  . Marital status: Married    Spouse name: Not on file  . Number of children: Not on file  . Years of education: Not on file  . Highest education level: Not on file  Occupational History  . Not on file  Tobacco Use  .  Smoking status: Former Research scientist (life sciences)  . Smokeless tobacco: Never Used  . Tobacco comment: 20 years ago  Substance and Sexual Activity  . Alcohol use: Yes    Comment: ocassional  . Drug use: No  . Sexual activity: Not on file  Other Topics Concern  . Not on file   Social History Narrative  . Not on file   Social Determinants of Health   Financial Resource Strain:   . Difficulty of Paying Living Expenses:   Food Insecurity:   . Worried About Charity fundraiser in the Last Year:   . Arboriculturist in the Last Year:   Transportation Needs:   . Film/video editor (Medical):   Marland Kitchen Lack of Transportation (Non-Medical):   Physical Activity:   . Days of Exercise per Week:   . Minutes of Exercise per Session:   Stress:   . Feeling of Stress :   Social Connections:   . Frequency of Communication with Friends and Family:   . Frequency of Social Gatherings with Friends and Family:   . Attends Religious Services:   . Active Member of Clubs or Organizations:   . Attends Archivist Meetings:   Marland Kitchen Marital Status:   Intimate Partner Violence:   . Fear of Current or Ex-Partner:   . Emotionally Abused:   Marland Kitchen Physically Abused:   . Sexually Abused:      History reviewed. No pertinent family history.   Current Facility-Administered Medications:  .  Marland KitchenTPN (CLINIMIX-E) Adult, , Intravenous, Continuous TPN, Sakai, Isami, DO, Last Rate: 83 mL/hr at 08/07/19 0407, Rate Verify at 08/07/19 0407 .  Marland KitchenTPN (CLINIMIX-E) Adult, , Intravenous, Continuous TPN **FOLLOWED BY** [START ON 08/08/2019] .TPN (CLINIMIX-E) Adult, , Intravenous, Continuous TPN, Dallie Piles, RPH .  0.9 %  sodium chloride infusion, , Intravenous, PRN, Lysle Pearl, Isami, DO, Last Rate: 50 mL/hr at 08/07/19 1054, 50 mL at 08/07/19 1054 .  acidophilus (RISAQUAD) capsule 1 capsule, 1 capsule, Oral, Daily, Sakai, Isami, DO, 1 capsule at 08/07/19 0857 .  anidulafungin (ERAXIS) 100 mg in sodium chloride 0.9 % 100 mL IVPB, 100 mg, Intravenous, Q24H, Sakai, Isami, DO, Last Rate: 78 mL/hr at 08/07/19 1136, 100 mg at 08/07/19 1136 .  Chlorhexidine Gluconate Cloth 2 % PADS 6 each, 6 each, Topical, Daily, Sakai, Isami, DO, 6 each at 08/07/19 0848 .  diphenhydrAMINE (BENADRYL) injection 12.5 mg, 12.5  mg, Intravenous, Q6H PRN, Sakai, Isami, DO, 12.5 mg at 08/07/19 1047 .  enoxaparin (LOVENOX) injection 40 mg, 40 mg, Subcutaneous, Q12H, Sakai, Isami, DO, 40 mg at 08/07/19 0848 .  escitalopram (LEXAPRO) tablet 20 mg, 20 mg, Oral, Daily, Sakai, Isami, DO, 20 mg at 08/07/19 0857 .  Fat emulsion 20 % infusion 360 mL, 360 mL, Intravenous, Continuous TPN, Dallie Piles, RPH .  gabapentin (NEURONTIN) capsule 300 mg, 300 mg, Oral, QHS, Sakai, Isami, DO .  HYDROmorphone (DILAUDID) injection 0.5 mg, 0.5 mg, Intravenous, Q3H PRN, Sakai, Isami, DO, 0.5 mg at 08/07/19 1230 .  insulin aspart (novoLOG) injection 0-9 Units, 0-9 Units, Subcutaneous, Q6H, Sakai, Isami, DO, 1 Units at 08/07/19 1229 .  ipratropium-albuterol (DUONEB) 0.5-2.5 (3) MG/3ML nebulizer solution 3 mL, 3 mL, Nebulization, Q4H PRN, Sakai, Isami, DO, 3 mL at 07/28/19 1205 .  ketorolac (TORADOL) 30 MG/ML injection 30 mg, 30 mg, Intravenous, Q8H PRN, Sakai, Isami, DO, 30 mg at 08/07/19 1139 .  levothyroxine (SYNTHROID, LEVOTHROID) injection 62.5 mcg, 62.5 mcg, Intravenous, Daily, Sakai, Isami, DO, 62.5 mcg at  08/07/19 0851 .  LORazepam (ATIVAN) injection 1 mg, 1 mg, Intravenous, QHS PRN,MR X 1, Sakai, Isami, DO, 1 mg at 07/30/19 2148 .  menthol-cetylpyridinium (CEPACOL) lozenge 3 mg, 1 lozenge, Oral, Q2H PRN, Sakai, Isami, DO .  meropenem (MERREM) 1 g in sodium chloride 0.9 % 100 mL IVPB, 1 g, Intravenous, Q8H, Leonel Ramsay, MD, Last Rate: 200 mL/hr at 08/07/19 1055, 1 g at 08/07/19 1055 .  metoCLOPramide (REGLAN) injection 5 mg, 5 mg, Intravenous, Q8H PRN, Sakai, Isami, DO, 5 mg at 08/04/19 2017 .  [DISCONTINUED] ondansetron (ZOFRAN) tablet 4 mg, 4 mg, Oral, Q6H PRN **OR** ondansetron (ZOFRAN) injection 4 mg, 4 mg, Intravenous, Q4H PRN, Sakai, Isami, DO, 4 mg at 08/03/19 1744 .  pantoprazole (PROTONIX) injection 40 mg, 40 mg, Intravenous, QHS, Sakai, Isami, DO, 40 mg at 08/06/19 2046 .  promethazine (PHENERGAN) injection 25 mg, 25 mg,  Intravenous, Q6H PRN, Sakai, Isami, DO, 25 mg at 08/04/19 0620 .  scopolamine (TRANSDERM-SCOP) 1 MG/3DAYS 1.5 mg, 1 patch, Transdermal, Q72H, Sakai, Isami, DO, 1.5 mg at 08/06/19 1815 .  sodium chloride flush (NS) 0.9 % injection 10-40 mL, 10-40 mL, Intracatheter, Q12H, Sakai, Isami, DO, 10 mL at 08/07/19 1134 .  sodium chloride flush (NS) 0.9 % injection 10-40 mL, 10-40 mL, Intracatheter, PRN, Lysle Pearl, Isami, DO, 10 mL at 08/07/19 1128   Physical exam:  Vitals:   08/06/19 2016 08/06/19 2230 08/07/19 0212 08/07/19 0536  BP: 125/66 111/65  (!) 117/59  Pulse: (!) 104 (!) 101  (!) 108  Resp: 12 16  16   Temp: 98.2 F (36.8 C) 97.8 F (36.6 C)  97.6 F (36.4 C)  TempSrc: Oral Oral  Oral  SpO2: 96% 98%  95%  Weight:   233 lb 11 oz (106 kg)   Height:       Physical Exam  Constitutional: No distress.  HENT:  NG tube  Cardiovascular:  Tachycardia  Pulmonary/Chest: Effort normal.  Neurological:  Sleeping  Skin: Skin is warm.       CMP Latest Ref Rng & Units 08/07/2019  Glucose 70 - 99 mg/dL 129(H)  BUN 6 - 20 mg/dL 11  Creatinine 0.44 - 1.00 mg/dL 0.54  Sodium 135 - 145 mmol/L 135  Potassium 3.5 - 5.1 mmol/L 3.8  Chloride 98 - 111 mmol/L 100  CO2 22 - 32 mmol/L 27  Calcium 8.9 - 10.3 mg/dL 7.8(L)  Total Protein 6.5 - 8.1 g/dL -  Total Bilirubin 0.3 - 1.2 mg/dL -  Alkaline Phos 38 - 126 U/L -  AST 15 - 41 U/L -  ALT 0 - 44 U/L -   CBC Latest Ref Rng & Units 08/07/2019  WBC 4.0 - 10.5 K/uL 23.6(H)  Hemoglobin 12.0 - 15.0 g/dL 6.5(L)  Hematocrit 36.0 - 46.0 % 20.8(L)  Platelets 150 - 400 K/uL 1,006(HH)    RADIOGRAPHIC STUDIES: I have personally reviewed the radiological images as listed and agreed with the findings in the report. DG Chest 1 View  Result Date: 07/28/2019 CLINICAL DATA:  Tachypnea, recent hysterectomy EXAM: CHEST  1 VIEW COMPARISON:  07/27/2019 chest radiograph. FINDINGS: Low lung volumes. Stable cardiomediastinal silhouette with normal heart size. No  pneumothorax. No pleural effusion. Mild right basilar atelectasis, unchanged. No pulmonary edema. No acute consolidative airspace disease. IMPRESSION: Stable low lung volumes with mild right basilar atelectasis. Electronically Signed   By: Ilona Sorrel M.D.   On: 07/28/2019 16:45   DG Chest 1 View  Result Date: 07/27/2019 CLINICAL DATA:  Tachypnea.  Recent abdominal surgery. EXAM: CHEST  1 VIEW COMPARISON:  11/03/2010 FINDINGS: Lung volumes are low. There is opacity at the right lung base consistent with atelectasis. Remainder of the lungs is clear. No convincing pleural effusion and no pneumothorax. Cardiac silhouette is normal in size. No mediastinal or hilar masses. Skeletal structures are grossly intact. IMPRESSION: 1. No acute cardiopulmonary disease. 2. Mild right lung base atelectasis. Electronically Signed   By: Lajean Manes M.D.   On: 07/27/2019 16:15   DG Abd 1 View  Result Date: 07/22/2019 CLINICAL DATA:  Evaluate for possible retained sponge EXAM: ABDOMEN - 1 VIEW COMPARISON:  None. FINDINGS: Scattered large and small bowel gas is noted. No abnormal mass or abnormal calcifications are seen. Postsurgical changes are noted. No radiopaque foreign body is identified to suggest retained sponge. IMPRESSION: No evidence of retained foreign body. These results will be called to the ordering clinician or representative by the Radiologist Assistant, and communication documented in the PACS or zVision Dashboard. Electronically Signed   By: Inez Catalina M.D.   On: 07/22/2019 22:49   [place] Abscess Drain  Result Date: 08/02/2019 INDICATION: 44 year old with recent hysterectomy and bowel surgery. Patient has elevated white blood cell count and small intra-abdominal fluid collections. EXAM: ULTRASOUND-GUIDED ASPIRATION OF ABDOMINAL FLUID MEDICATIONS: Fentanyl 25 mcg ANESTHESIA/SEDATION: The patient was continuously monitored during the procedure by the interventional radiology nurse under my direct supervision.  COMPLICATIONS: None immediate. PROCEDURE: Informed written consent was obtained from the patient after a thorough discussion of the procedural risks, benefits and alternatives. All questions were addressed. A timeout was performed prior to the initiation of the procedure. Abdomen was evaluated with ultrasound. Pocket of fluid in the anterior right lower abdomen was targeted just medial to the existing percutaneous drain. Skin was prepped with chlorhexidine and sterile field was created. Skin and soft tissues were anesthetized with 1% lidocaine. Using ultrasound guidance, a Yueh catheter was directed into this peritoneal fluid. Only 3 mL of fluid could be obtained. Unable to aspirate majority of the fluid in this area. Bandage placed over the puncture site. FINDINGS: Small pocket of fluid along the right anterior lower abdomen just medial to the existing percutaneous drain. Needle position confirmed within this small collection but only a small amount of slightly thick red fluid was removed. IMPRESSION: Ultrasound-guided aspiration of fluid from the right anterior abdomen. Only a small amount of slightly thick red fluid could be obtained. Findings could represent blood products or hematoma formation. Fluid was sent for culture. Electronically Signed   By: Markus Daft M.D.   On: 08/02/2019 17:11   [place] Abscess Drain  Result Date: 07/26/2019 INDICATION: History of hysterectomy complicated by small bowel injury requiring resection and primary anastomosis. CT scan of the abdomen and pelvis performed earlier today secondary to elevated white blood cell count and fever demonstrated indeterminate fluid collections within the abdomen with dominant collection within the right lower abdominal quadrant As such, request made for ultrasound-guided paracentesis versus drainage catheter placement for and diagnostic and potentially therapeutic purposes. EXAM: ULTRASOUND GUIDED ABSCESS DRAINAGE COMPARISON:  CT abdomen and pelvis -  earlier same day MEDICATIONS: The patient is currently admitted to the hospital and receiving intravenous antibiotics. The antibiotics were administered within an appropriate time frame prior to the initiation of the procedure. ANESTHESIA/SEDATION: None CONTRAST:  None COMPLICATIONS: None immediate. PROCEDURE: Informed written consent was obtained from the patient after a discussion of the risks, benefits and alternatives to treatment. The patient was placed supine  on her hospital bed and sonographic evaluation was performed of the abdomen demonstrating ill-defined fluid scattered throughout the abdomen with dominant collection within right lower abdominal quadrant measuring approximately 9.6 x 8.2 x 3.9 cm, correlating with the dominant collection seen on preceding abdominal CT image 66, series 2. The procedure was planned. A timeout was performed prior to the initiation of the procedure. The skin overlying the right lower abdomen was prepped and draped in the usual sterile fashion. The overlying soft tissues were anesthetized with 1% lidocaine with epinephrine. Appropriate trajectory was planned with the use of a 22 gauge spinal needle. An 18 gauge trocar needle was advanced into the abscess/fluid collection and a short Amplatz super stiff wire was coiled within the collection. Multiple ultrasound images were saved for procedural documentation purposes Next, the track was dilated ultimately allowing placement of a 10 [place] all-purpose drainage catheter. Next, approximately 150 cc of brown serous ascitic fluid was aspirated. A representative sample was capped and sent to the laboratory for analysis. The tube was connected to a drainage bag and sutured in place. A dressing was placed. The patient tolerated the procedure well without immediate post procedural complication. IMPRESSION: Successful ultrasound guided placement of a 10 [place] all purpose drain catheter into the dominant collection within the right lower  abdomen with aspiration of 150 cc of brown, serous ascitic fluid. Samples were sent to the laboratory as requested by the ordering clinical team. Electronically Signed   By: Sandi Mariscal M.D.   On: 07/26/2019 17:30   CT ABDOMEN PELVIS W CONTRAST  Result Date: 07/31/2019 CLINICAL DATA:  44 yo F s/p exp. Lap and repair of small bowel enterotomy as well as hysterectomy with OB/GYN and surgery involved. Reexploration abdominal washout and resection of original anastomosis today 3/14. EXAM: CT ABDOMEN AND PELVIS WITH CONTRAST TECHNIQUE: Multidetector CT imaging of the abdomen and pelvis was performed using the standard protocol following bolus administration of intravenous contrast. CONTRAST:  162mL OMNIPAQUE IOHEXOL 300 MG/ML  SOLN COMPARISON:  07/26/2019 FINDINGS: Lower chest: Dependent atelectasis, greater on the right, similar to the prior CT. No acute findings. Hepatobiliary: No focal liver abnormality is seen. Status post cholecystectomy. No biliary dilatation. Pancreas: Unremarkable. No pancreatic ductal dilatation or surrounding inflammatory changes. Spleen: Normal in size without focal abnormality. Adrenals/Urinary Tract: No adrenal masses. Stable low-density renal masses consistent with cysts on the left. Mild prominence of the right intrarenal collecting system. No renal or ureteral stones. No left renal collecting system dilation. Normal left ureter. Symmetric renal enhancement and excretion. Bladder minimally distended. Foley catheter in place. No bladder mass. Stomach/Bowel: Normal stomach. Mesenteric edema and inflammation most evident in the central to lower abdomen. Right lower quadrant percutaneously placed drainage catheter significantly decreases the size of the previously seen right lower quadrant collection. Residual collection measures 6.4 x 2.9 cm transversely. Small anterior less well-defined fluid collection is seen, to the right of midline, near the level of the umbilicus, 6.3 x 1.9 cm  transversely. Fluid is noted within leaves of the small bowel mesentery most evident mid to lower abdomen, without additional defined collection. Bowel anastomosis staples are noted upper anterior pelvis extending to the right lower quadrant. No evidence of bowel obstruction. Several loops of central to lower abdomen small bowel demonstrate mild wall thickening. Colon is mostly decompressed. No colonic wall thickening. Vascular/Lymphatic: No vascular abnormality. Several prominent mesenteric lymph nodes most evident right lower quadrant, all subcentimeter and similar to the prior CT. Reproductive: Status post hysterectomy. No adnexal  masses. Other: Anterior abdominal wall air adjacent to the midline incision. No hernia. No abdominal wall collection to suggest abscess. Small amount of ascites. Tiny bubbles of free intraperitoneal air are noted deep to the midline incision. Musculoskeletal: No acute or significant abnormality. IMPRESSION: 1. No bowel obstruction. Mild small bowel wall thickening in the lower abdomen is likely reactive/edema from the recent surgery. 2. Significant improvement the right lower quadrant collection following placement of a percutaneous drainage catheter. 3. No new abdominal collections. Small amount of ascites and diffuse mesenteric edema most evident mid to lower abdomen. 4. Few tiny bubbles of anterior midline free air, presumed postsurgical. Electronically Signed   By: Lajean Manes M.D.   On: 07/31/2019 10:48   CT ABDOMEN PELVIS W CONTRAST  Result Date: 07/26/2019 CLINICAL DATA:  Abdominal pain and tenderness. Leukocytosis. Recent small bowel resection and hysterectomy. EXAM: CT ABDOMEN AND PELVIS WITH CONTRAST TECHNIQUE: Multidetector CT imaging of the abdomen and pelvis was performed using the standard protocol following bolus administration of intravenous contrast. CONTRAST:  152mL OMNIPAQUE IOHEXOL 300 MG/ML  SOLN COMPARISON:  06/21/2019 FINDINGS: Lower Chest: New bibasilar  atelectasis. Hepatobiliary: No hepatic masses identified. Prior cholecystectomy. No evidence of biliary obstruction. Pancreas:  No mass or inflammatory changes. Spleen: Within normal limits in size and appearance. Adrenals/Urinary Tract: Stable tiny bilateral renal cysts. No masses identified. No evidence of ureteral calculi or hydronephrosis. Stomach/Bowel: A small amount of extraperitoneal air in the lower pelvis, and intraperitoneal air, are consistent with recent surgery. No evidence of bowel obstruction. Mildly dilated small bowel loops are seen containing air-fluid levels and increased gaseous distention of colon is also seen, suspicious for adynamic ileus. Diffuse mesenteric inflammatory changes or edema are new since previous study. Multiple small loculated fluid intraperitoneal fluid collections are seen in the abdomen and pelvis, 1 in the right lower quadrant measuring 9.8 x 5.1 cm on image 66/2. Vascular/Lymphatic: No pathologically enlarged lymph nodes. No abdominal aortic aneurysm. Reproductive: Prior hysterectomy noted. Rim enhancing fluid collection seen in the hysterectomy bed which measures 8.0 x 7.4 cm on image 67/7. Other:  None. Musculoskeletal:  No suspicious bone lesions identified. IMPRESSION: 1. Small amount of intraperitoneal and extraperitoneal air, consistent with recent surgery. 2. Multiple loculated intraperitoneal fluid collections in the abdomen and pelvis, largest in the right lower quadrant measuring 9.8 cm and pelvic cul-de-sac measuring 8.0 cm, suspicious for abscesses. 3. Mild diffuse dilatation of small bowel and colon, suspicious for adynamic ileus. 4. New bibasilar atelectasis. Electronically Signed   By: Marlaine Hind M.D.   On: 07/26/2019 13:38   US Abdomen Limited  Result Date: 07/30/2019 CLINICAL DATA:  Drainage from abdominal wall incision site for hysterectomy. Evaluate for abscess. EXAM: LIMITED ULTRASOUND OF ABDOMINAL SOFT TISSUES TECHNIQUE: Ultrasound examination of  the anterior abdominal wall soft tissues was performed in the area of clinical concern at the previous incision site. COMPARISON:  None. FINDINGS: A small fluid collection is seen within the subcutaneous tissues of the midline abdominal wall just above the umbilicus which measures 0.5 x 0.5 x 1.1 cm. A 2nd small complex fluid collection is seen just below the level of the umbilicus which measures 1.3 x 0.5 x 1.1 cm. IMPRESSION: Two small approximately 1 cm complex fluid collections are identified in the midline abdominal wall subcutaneous tissues. These may represent postop seromas or abscesses. Electronically Signed   By: Marlaine Hind M.D.   On: 07/30/2019 11:41   US Venous Img Lower Bilateral (DVT)  Result Date: 07/27/2019 CLINICAL  DATA:  Tachycardia.  History of recent surgery. EXAM: BILATERAL LOWER EXTREMITY VENOUS DOPPLER ULTRASOUND TECHNIQUE: Gray-scale sonography with compression, as well as color and duplex ultrasound, were performed to evaluate the deep venous system(s) from the level of the common femoral vein through the popliteal and proximal calf veins. COMPARISON:  None. FINDINGS: VENOUS Normal compressibility of the common femoral, superficial femoral, and popliteal veins, as well as the visualized calf veins. Visualized portions of profunda femoral vein and great saphenous vein unremarkable. No filling defects to suggest DVT on grayscale or color Doppler imaging. Doppler waveforms show normal direction of venous flow, normal respiratory phasicity and response to augmentation. Limited views of the contralateral common femoral vein are unremarkable. OTHER Edema noted in the legs bilaterally. Limitations: none IMPRESSION: No femoropopliteal DVT nor evidence of DVT within the visualized calf veins. If clinical symptoms are inconsistent or if there are persistent or worsening symptoms, further imaging (possibly involving the iliac veins) may be warranted. Electronically Signed   By: Lajean Manes M.D.    On: 07/27/2019 16:16   [place] EKG SITE RITE  Result Date: 07/30/2019 If Site Rite image not attached, placement could not be confirmed due to current cardiac rhythm.   Assessment and plan-   #Thrombocytosis, likely secondary to acute infection/inflammation and underlying iron deficiency. I ordered another dose of Venofer 200 mg today and she already received.  She tolerates well.  Some skin itchiness, improved with Benadryl. Labs reviewed and discussed with patient.  Platelet counts remain high but stable.  Likely reactive.  #Anemia, hemoglobin 6.5, MCV is 94, Discussed with Dr. Lysle Pearl, he plans to repeat hemoglobin today if still lower than 7, I agree with PRBC transfusion to keep hemoglobin above 7. Check vitamin B12 and folate level.  Ordered. #Tachycardia,Multifactorial.  Anemia can contribute to high heart rate.  Keep hemoglobin above 7.. #Leukocytosis due to surgery/infection/inflammation.  Continue antibiotics.  Patient is on TPN.  Fungal coverage. Leukocytosis has trended down slightly today.  DVT prophylaxis with Lovenox.  Earlie Server, MD, PhD Hematology Oncology [facility] Pager- [phone] 08/07/2019

## 2019-08-07 NOTE — Care Management (Signed)
MD request for RNCM to postpone discussion regarding home health with patient at this time

## 2019-08-08 DIAGNOSIS — E538 Deficiency of other specified B group vitamins: Secondary | ICD-10-CM

## 2019-08-08 LAB — CBC WITH DIFFERENTIAL/PLATELET
Abs Immature Granulocytes: 1.64 10*3/uL — ABNORMAL HIGH (ref 0.00–0.07)
Basophils Absolute: 0.1 10*3/uL (ref 0.0–0.1)
Basophils Relative: 1 %
Eosinophils Absolute: 0.4 10*3/uL (ref 0.0–0.5)
Eosinophils Relative: 2 %
HCT: 23.5 % — ABNORMAL LOW (ref 36.0–46.0)
Hemoglobin: 7.5 g/dL — ABNORMAL LOW (ref 12.0–15.0)
Immature Granulocytes: 9 %
Lymphocytes Relative: 15 %
Lymphs Abs: 2.8 10*3/uL (ref 0.7–4.0)
MCH: 29.4 pg (ref 26.0–34.0)
MCHC: 31.9 g/dL (ref 30.0–36.0)
MCV: 92.2 fL (ref 80.0–100.0)
Monocytes Absolute: 1.4 10*3/uL — ABNORMAL HIGH (ref 0.1–1.0)
Monocytes Relative: 7 %
Neutro Abs: 13.1 10*3/uL — ABNORMAL HIGH (ref 1.7–7.7)
Neutrophils Relative %: 66 %
Platelets: 946 10*3/uL (ref 150–400)
RBC: 2.55 MIL/uL — ABNORMAL LOW (ref 3.87–5.11)
RDW: 15.2 % (ref 11.5–15.5)
WBC: 19.3 10*3/uL — ABNORMAL HIGH (ref 4.0–10.5)
nRBC: 0.7 % — ABNORMAL HIGH (ref 0.0–0.2)

## 2019-08-08 LAB — AEROBIC/ANAEROBIC CULTURE W GRAM STAIN (SURGICAL/DEEP WOUND)

## 2019-08-08 LAB — COMPREHENSIVE METABOLIC PANEL
ALT: 26 U/L (ref 0–44)
AST: 31 U/L (ref 15–41)
Albumin: 2 g/dL — ABNORMAL LOW (ref 3.5–5.0)
Alkaline Phosphatase: 97 U/L (ref 38–126)
Anion gap: 6 (ref 5–15)
BUN: 8 mg/dL (ref 6–20)
CO2: 26 mmol/L (ref 22–32)
Calcium: 8.1 mg/dL — ABNORMAL LOW (ref 8.9–10.3)
Chloride: 105 mmol/L (ref 98–111)
Creatinine, Ser: 0.55 mg/dL (ref 0.44–1.00)
GFR calc Af Amer: >60 mL/min (ref >60)
GFR calc non Af Amer: >60 mL/min (ref >60)
Glucose, Bld: 138 mg/dL — ABNORMAL HIGH (ref 70–99)
Potassium: 3.8 mmol/L (ref 3.5–5.1)
Sodium: 137 mmol/L (ref 135–145)
Total Bilirubin: 0.3 mg/dL (ref 0.3–1.2)
Total Protein: 6.9 g/dL (ref 6.5–8.1)

## 2019-08-08 LAB — GLUCOSE, CAPILLARY
Glucose-Capillary: 118 mg/dL — ABNORMAL HIGH (ref 70–99)
Glucose-Capillary: 121 mg/dL — ABNORMAL HIGH (ref 70–99)
Glucose-Capillary: 124 mg/dL — ABNORMAL HIGH (ref 70–99)
Glucose-Capillary: 128 mg/dL — ABNORMAL HIGH (ref 70–99)
Glucose-Capillary: 139 mg/dL — ABNORMAL HIGH (ref 70–99)

## 2019-08-08 LAB — RETIC PANEL
Immature Retic Fract: 36.3 % — ABNORMAL HIGH (ref 2.3–15.9)
RBC.: 2.57 MIL/uL — ABNORMAL LOW (ref 3.87–5.11)
Retic Count, Absolute: 91.7 10*3/uL (ref 19.0–186.0)
Retic Ct Pct: 3.6 % — ABNORMAL HIGH (ref 0.4–3.1)
Reticulocyte Hemoglobin: 24.7 pg — ABNORMAL LOW (ref >27.9)

## 2019-08-08 LAB — TYPE AND SCREEN
ABO/RH(D): O POS
Antibody Screen: NEGATIVE
Unit division: 0

## 2019-08-08 LAB — BPAM RBC
Blood Product Expiration Date: 202104252359
ISSUE DATE / TIME: 202103241815
Unit Type and Rh: 5100

## 2019-08-08 LAB — PHOSPHORUS: Phosphorus: 3.6 mg/dL (ref 2.5–4.6)

## 2019-08-08 LAB — MAGNESIUM: Magnesium: 2.1 mg/dL (ref 1.7–2.4)

## 2019-08-08 MED ORDER — TRACE MINERALS CU-MN-SE-ZN 300-55-60-3000 MCG/ML IV SOLN
INTRAVENOUS | Status: AC
  Filled 2019-08-08: qty 2000

## 2019-08-08 MED ORDER — CYANOCOBALAMIN 1000 MCG/ML IJ SOLN
1000.0000 ug | Freq: Every day | INTRAMUSCULAR | Status: DC
  Administered 2019-08-08 – 2019-08-11 (×4): 1000 ug via INTRAMUSCULAR
  Filled 2019-08-08 (×5): qty 1

## 2019-08-08 MED ORDER — FOLIC ACID 5 MG/ML IJ SOLN
1.0000 mg | Freq: Every day | INTRAMUSCULAR | Status: AC
  Administered 2019-08-08 – 2019-08-10 (×3): 1 mg via INTRAVENOUS
  Filled 2019-08-08 (×3): qty 0.2

## 2019-08-08 MED ORDER — ACETAMINOPHEN 10 MG/ML IV SOLN
1000.0000 mg | Freq: Four times a day (QID) | INTRAVENOUS | Status: AC
  Administered 2019-08-08 – 2019-08-09 (×4): 1000 mg via INTRAVENOUS
  Filled 2019-08-08 (×4): qty 100

## 2019-08-08 MED ORDER — ACETAMINOPHEN 10 MG/ML IV SOLN
1000.0000 mg | Freq: Four times a day (QID) | INTRAVENOUS | Status: DC
  Filled 2019-08-08: qty 100

## 2019-08-08 MED ORDER — CLINIMIX E/DEXTROSE (5/20) 5 % IV SOLN
INTRAVENOUS | Status: AC
  Filled 2019-08-08: qty 400

## 2019-08-08 NOTE — Progress Notes (Signed)
Karen Aguirre INFECTIOUS DISEASE PROGRESS NOTE Date of Admission:  07/22/2019     ID: Karen Aguirre is a 44 y.o. female with  leukocytosis Active Problems:   Fibroid uterus   Pelvic pain   S/P laparotomy   Thrombocytosis (HCC)   Absolute anemia   Intra-abdominal fluid collection   Abdominal wall abscess   Subjective: No fevers and wbc down to 19.    ROS  Eleven systems are reviewed and negative except per hpi  Medications:  Antibiotics Given (last 72 hours)    Date/Time Action Medication Dose Rate   08/05/19 1916 New Bag/Given   piperacillin-tazobactam (ZOSYN) IVPB 3.375 g 3.375 g 12.5 mL/hr   08/06/19 0348 New Bag/Given   piperacillin-tazobactam (ZOSYN) IVPB 3.375 g 3.375 g 12.5 mL/hr   08/06/19 1230 New Bag/Given   piperacillin-tazobactam (ZOSYN) IVPB 3.375 g 3.375 g 12.5 mL/hr   08/06/19 1801 New Bag/Given   piperacillin-tazobactam (ZOSYN) IVPB 3.375 g 3.375 g 12.5 mL/hr   08/07/19 0210 New Bag/Given   piperacillin-tazobactam (ZOSYN) IVPB 3.375 g 3.375 g 12.5 mL/hr   08/07/19 1055 New Bag/Given   meropenem (MERREM) 1 g in sodium chloride 0.9 % 100 mL IVPB 1 g 200 mL/hr   08/07/19 2208 New Bag/Given   meropenem (MERREM) 1 g in sodium chloride 0.9 % 100 mL IVPB 1 g 200 mL/hr   08/08/19 0537 New Bag/Given   meropenem (MERREM) 1 g in sodium chloride 0.9 % 100 mL IVPB 1 g 200 mL/hr   08/08/19 1257 New Bag/Given   meropenem (MERREM) 1 g in sodium chloride 0.9 % 100 mL IVPB 1 g 200 mL/hr     . sodium chloride   Intravenous Once  . acidophilus  1 capsule Oral Daily  . Chlorhexidine Gluconate Cloth  6 each Topical Daily  . cyanocobalamin  1,000 mcg Intramuscular Daily  . escitalopram  20 mg Oral Daily  . folic acid  1 mg Intravenous Daily  . gabapentin  300 mg Oral QHS  . insulin aspart  0-9 Units Subcutaneous Q6H  . levothyroxine  62.5 mcg Intravenous Daily  . pantoprazole (PROTONIX) IV  40 mg Intravenous QHS  . scopolamine  1 patch Transdermal Q72H  . sodium  chloride flush  10-40 mL Intracatheter Q12H    Objective: Vital signs in last 24 hours: Temp:  [98.2 F (36.8 C)-99.9 F (37.7 C)] 98.2 F (36.8 C) (03/25 1134) Pulse Rate:  [94-109] 94 (03/25 1134) Resp:  [16-20] 17 (03/25 1134) BP: (129-147)/(62-76) 134/76 (03/25 1134) SpO2:  [95 %-98 %] 98 % (03/25 1134) Constitutional:  oriented to person, place, and time. Ill appearing  NGT in place HENT: [place]/AT, PERRLA, no scleral icterus Mouth/Throat: Oropharynx is clear and moist. No oropharyngeal exudate.  Cardiovascular: Normal rate, regular rhythm and normal heart sounds. Exam reveals no gallop and no friction rub.  No murmur heard.  Pulmonary/Chest: Effort normal and breath sounds normal. No respiratory distress.  has no wheezes.  Neck = supple, no nuchal rigidity Abdominal: Soft.  Midline incision covered post op. Mid line colostomy,   Perc drain in place with SS drainage. Lymphadenopathy: no cervical adenopathy. No axillary adenopathy Neurological: alert and oriented to person, place, and time.  Skin: Skin is warm and dry. No rash noted. No erythema.  R arm picc line in place. Psychiatric: a normal mood and affect.  behavior is normal.   Lab Results Recent Labs    08/07/19 0532 08/07/19 1547 08/07/19 2130 08/08/19 0510  WBC 23.6*  --   --  19.3*  HGB 6.5*   < > 7.5* 7.5*  HCT 20.8*   < > 23.9* 23.5*  NA 135  --   --  137  K 3.8  --   --  3.8  CL 100  --   --  105  CO2 27  --   --  26  BUN 11  --   --  8  CREATININE 0.54  --   --  0.55   < > = values in this interval not displayed.    Microbiology: Results for orders placed or performed during the hospital encounter of 07/22/19  Urine Culture     Status: None   Collection Time: 07/23/19  4:00 PM   Specimen: Urine, Catheterized  Result Value Ref Range Status   Specimen Description   Final    URINE, CATHETERIZED Performed at [facility], [address], [place], [place] [address]    Special Requests   Final     NONE Performed at [facility], [address], [place], [place] [address]    Culture   Final    NO GROWTH Performed at [facility], [place] [address], [place], [place] [address]    Report Status 07/24/2019 FINAL  Final  Aerobic/Anaerobic Culture (surgical/deep wound)     Status: None   Collection Time: 07/26/19  4:42 PM   Specimen: PATH Other; Tissue  Result Value Ref Range Status   Specimen Description   Final    FLUID ABDOMEN RLQ Performed at [facility], [address], [place], [place] [address]    Special Requests   Final    NONE Performed at [facility], [address] [place], [place] [address]    Gram Stain   Final    FEW WBC PRESENT, PREDOMINANTLY PMN NO ORGANISMS SEEN    Culture   Final    No growth aerobically or anaerobically. Performed at [facility], [place] [address], [place], [place] [address]    Report Status 08/01/2019 FINAL  Final  Urine Culture     Status: None   Collection Time: 07/28/19 12:03 AM   Specimen: PATH Other; Urine  Result Value Ref Range Status   Specimen Description   Final    URINE, RANDOM Performed at [facility], [address] [place], [place] [address]    Special Requests   Final    NONE Performed at [facility], [address] [place], [place] [address]    Culture   Final    NO GROWTH Performed at [facility], [place] [address] [place], [place] [address]    Report Status 07/29/2019 FINAL  Final  Culture, blood (routine x 2)     Status: None   Collection Time: 07/30/19  6:02 PM   Specimen: BLOOD  Result Value Ref Range Status   Specimen Description BLOOD ALIN  Final   Special Requests   Final    BOTTLES DRAWN AEROBIC AND ANAEROBIC Blood Culture results may not be optimal due to an excessive volume of blood received in culture bottles   Culture   Final    NO GROWTH 5 DAYS Performed at [facility], [address], [place],   [address]    Report Status 08/04/2019 FINAL  Final  Culture, blood (routine x 2)     Status: None   Collection Time: 07/30/19  7:02 PM   Specimen: BLOOD  Result Value Ref Range Status  Specimen Description BLOOD PORTA CATH  Final   Special Requests   Final    BOTTLES DRAWN AEROBIC AND ANAEROBIC Blood Culture results may not be optimal due to an excessive volume of blood received in culture bottles   Culture   Final    NO GROWTH 5 DAYS Performed at [facility], [address], [place], [place] [address]    Report Status 08/04/2019 FINAL  Final  C difficile quick scan w PCR reflex     Status: None   Collection Time: 07/31/19  4:38 PM   Specimen: STOOL  Result Value Ref Range Status   C Diff antigen NEGATIVE NEGATIVE Final   C Diff toxin NEGATIVE NEGATIVE Final   C Diff interpretation No C. difficile detected.  Final    Comment: Performed at [facility], [address] [place], [place] [address]  Aerobic Culture (superficial specimen)     Status: None   Collection Time: 07/31/19 10:18 PM   Specimen: JP Drain; Abdominal Fluid  Result Value Ref Range Status   Specimen Description   Final    JP DRAINAGE Performed at [facility], [address], [place], [place] [address]    Special Requests   Final    Normal Performed at [facility], [address] [place], [place] [address]    Gram Stain   Final    FEW WBC PRESENT,BOTH PMN AND MONONUCLEAR NO ORGANISMS SEEN Performed at [facility], [place] [address], [place], [place] [address]    Culture RARE ESCHERICHIA COLI  Final   Report Status 08/04/2019 FINAL  Final   Organism ID, Bacteria ESCHERICHIA COLI  Final      Susceptibility   Escherichia coli - MIC*    AMPICILLIN >=32 RESISTANT Resistant     CEFAZOLIN 16 SENSITIVE Sensitive     CEFEPIME <=0.12 SENSITIVE Sensitive     CEFTAZIDIME <=1 SENSITIVE Sensitive     CEFTRIAXONE <=0.25 SENSITIVE Sensitive     CIPROFLOXACIN <=0.25  SENSITIVE Sensitive     GENTAMICIN <=1 SENSITIVE Sensitive     IMIPENEM <=0.25 SENSITIVE Sensitive     TRIMETH/SULFA <=20 SENSITIVE Sensitive     AMPICILLIN/SULBACTAM >=32 RESISTANT Resistant     PIP/TAZO <=4 SENSITIVE Sensitive     * RARE ESCHERICHIA COLI  Body fluid culture     Status: None   Collection Time: 08/02/19  4:10 PM   Specimen: Abdomen; Body Fluid  Result Value Ref Range Status   Specimen Description   Final    ABDOMEN Performed at [facility], [address] [place], [place] [address]    Special Requests   Final    NONE Performed at [facility], [address] [place], [place] [address]    Gram Stain   Final    FEW WBC PRESENT, PREDOMINANTLY PMN NO ORGANISMS SEEN Performed at [facility], [place] [address] [place], [place] [address]    Culture NO GROWTH  Final   Report Status 08/06/2019 FINAL  Final  Aerobic/Anaerobic Culture (surgical/deep wound)     Status: None   Collection Time: 08/05/19 10:40 AM   Specimen: PATH GI Other; Tissue  Result Value Ref Range Status   Specimen Description   Final    TISSUE Performed at [facility], [address], [place], [place] [address]    Special Requests PATH GI  Final   Gram Stain   Final    RARE WBC PRESENT, PREDOMINANTLY PMN ABUNDANT GRAM  NEGATIVE RODS FEW GRAM POSITIVE RODS Performed at [facility], [place] [address] [place], [place] [address]    Culture   Final    FEW ESCHERICHIA COLI MODERATE BACTEROIDES FRAGILIS POSITIVE    Report Status 08/08/2019 FINAL  Final   Organism ID, Bacteria ESCHERICHIA COLI  Final      Susceptibility   Escherichia coli - MIC*    AMPICILLIN >=32 RESISTANT Resistant     CEFAZOLIN >=64 RESISTANT Resistant     CEFEPIME 16 RESISTANT Resistant     CEFTAZIDIME <=1 SENSITIVE Sensitive     CEFTRIAXONE <=0.25 SENSITIVE Sensitive     CIPROFLOXACIN <=0.25 SENSITIVE Sensitive     GENTAMICIN <=1 SENSITIVE Sensitive     IMIPENEM <=0.25 SENSITIVE  Sensitive     TRIMETH/SULFA <=20 SENSITIVE Sensitive     AMPICILLIN/SULBACTAM >=32 RESISTANT Resistant     PIP/TAZO >=128 RESISTANT Resistant     * FEW ESCHERICHIA COLI    Studies/Results: No results found.  Assessment/Plan: Karen Aguirre is a 44 y.o. female with complicated surgical history since 3/8 TAH complicated by SB perf and repair, with anastomoses leak and repeat surgery 3/14. Has persistent leukocytosis despite zosyn. No fevers. Possible sources include line infection, wound infection, intraabdominal source, TPN associated fungal infection, UTI.  3/18 feels better, wbc down some. Up and walking 3/19 - wbc up some, nausea 3/23 - s/p surg yest with SB leak noted. Has fevers again. Gram stain from abd with GNR and GPR. Cxs penging 3/25- wbc down, no fevers. cx from surgery with increasingly resistant E coli and bacteroides. Recommendations Cont meropenem giving increasingly resistant E coli identified Will cont eraxis despite no fungus identified on op cx given that she was already on it when she had surgery Can deescalate abx and antifungals as she improves. Thank you very much for the consult. Dr Delaine Lame will be available starting tomorrow for ID service.  Leonel Ramsay   08/08/2019, 2:47 PM

## 2019-08-08 NOTE — Progress Notes (Signed)
PHARMACY - TOTAL PARENTERAL NUTRITION CONSULT NOTE   Indication:  s/p laparoscopic converted to open total hysterectomy, left salpingectomy, cystoscopy and small bowel resection (near distal ileum) secondary to bowel injury 3/8. Pt declined post operatively requiring another exploratory laparotomy, lysis of adhesions, abdominal wound washout, resection of previous anastomosis with a portion small bowel removed and subsequent reanastomosis 3/14, now  second anastamosis failure and revision 3/23  Patient Measurements: Height: 5\' 2"  (157.5 cm) Weight: 233 lb 11 oz (106 kg) IBW/kg (Calculated) : 50.1 TPN AdjBW (KG): 70.9 Body mass index is 42.74 kg/m.  Assessment: 44 y.o. female  s/p exploratory laparotomy and repair of small bowel enterotomy as well as hysterectomy with OB/GYN  Post-Op re-exploration abdominal washout and resection of original anastomosis x 2, hospital day #16  Glucose / Insulin: BG 114-169, continue SSI q6h, 7 units/ 24 hrs  Electrolytes:   WNL Renal: Scr <1, stable LFTs / TGs: TG 198-->166 Prealbumin / albumin: 13.6/2.6 Intake / Output; MIVF:  GI Imaging: Surgeries / Procedures:   Central access:  07/30/19 TPN start date: 07/30/19  Nutritional Goals per RD recommendation revised 3/24 Kcal:  2832kcal/day Protein:  120g/day Goal TPN rate is 100 mL/hr (Regimen at goal rate will provide 2832kcal/day, 100g/day protein, 2760 ml volume )  Fluid: none  ABX: Eraxis 3/19>> Zosyn 3/12 >> 3/24 Meropenem 3/24 >>  Current Nutrition:  NPO  Plan:   continue Clinimix 5/20 with electrolytes at 100 ml/hr  Add MVI on Mondays and Thursdays, trace elements daily  Continue 20% lipids @30ml /hr x 12 hrs/day   Continue sensitive SSI q6h and adjust as needed   Monitor TPN labs on Mon/Thurs, per protocol and electrolytes daily for now  Dallie Piles, PharmD, BCPS Clinical Pharmacist 08/08/2019 7:21 AM

## 2019-08-08 NOTE — Progress Notes (Signed)
Hematology/Oncology Progress Note [facility] [phone] [phone]  Patient Care Team: Hortencia Pilar, MD as PCP - General (Family Medicine)   Name of the patient: Karen Aguirre  [id number]  1975/11/10  Date of visit: 08/08/19   INTERVAL HISTORY-  Status post IV Venofer 200 mg daily x 3 Status post 1 unit of PRBC transfusion yesterday. Today patient reports fatigue has improved.  Husband is at bedside. +surgical site pain   Review of systems- Review of Systems  Unable to perform ROS: Acuity of condition  Constitutional: Positive for fatigue.  Respiratory: Negative for cough and shortness of breath.   Gastrointestinal:       Abdomen surgery sites pain  Neurological: Negative for headaches.  Hematological: Does not bruise/bleed easily.    Allergies  Allergen Reactions  . Cobalt Hives    Patient Active Problem List   Diagnosis Date Noted  . Abdominal wall abscess   . Thrombocytosis ([place])   . Absolute anemia   . Intra-abdominal fluid collection   . Fibroid uterus 07/22/2019  . Pelvic pain 07/22/2019  . S/P laparotomy 07/22/2019     Past Medical History:  Diagnosis Date  . Anxiety   . Hypothyroidism   . Thyroid disease      Past Surgical History:  Procedure Laterality Date  . BOWEL RESECTION N/A 07/22/2019   Procedure: SMALL BOWEL RESECTION;  Surgeon: Ward, Honor Loh, MD;  Location: [facility];  Service: Gynecology;  Laterality: N/A;  . BOWEL RESECTION  07/27/2019   Procedure: SMALL BOWEL RESECTION;  Surgeon: Ward, Honor Loh, MD;  Location: [facility];  Service: Gynecology;;  . CHOLECYSTECTOMY    . ECTOPIC PREGNANCY SURGERY    . HYSTERECTOMY ABDOMINAL WITH SALPINGECTOMY Bilateral 07/22/2019   Procedure: HYSTERECTOMY ABDOMINAL WITH lbilateral SALPINGECTOMY, removal of left anterior cul de sac nodule, cystoscopy;  Surgeon: Ward, Honor Loh, MD;  Location: [facility];  Service: Gynecology;  Laterality: Bilateral;  . ILEOSTOMY  N/A 08/05/2019   Procedure: ILEOSTOMY;  Surgeon: Benjamine Sprague, DO;  Location: [facility];  Service: General;  Laterality: N/A;  . LAPAROTOMY N/A 07/22/2019   Procedure: LAPAROTOMY;  Surgeon: Ward, Honor Loh, MD;  Location: [facility];  Service: Gynecology;  Laterality: N/A;  . LAPAROTOMY N/A 07/27/2019   Procedure: EXPLORATORY LAPAROTOMY;  Surgeon: Ward, Honor Loh, MD;  Location: [facility];  Service: Gynecology;  Laterality: N/A;  . LAPAROTOMY N/A 08/05/2019   Procedure: EXPLORATORY LAPAROTOMY;  Surgeon: Benjamine Sprague, DO;  Location: [facility];  Service: General;  Laterality: N/A;  . ROBOTIC ASSISTED TOTAL HYSTERECTOMY WITH BILATERAL SALPINGO OOPHERECTOMY Bilateral 07/22/2019   Procedure: XI ROBOTIC ASSISTED TOTAL HYSTERECTOMY WITH BILATERAL SALPINGECTOMY _ATTEMPTED;  Surgeon: Ward, Honor Loh, MD;  Location: [facility];  Service: Gynecology;  Laterality: Bilateral;    Social History   Socioeconomic History  . Marital status: Married    Spouse name: Not on file  . Number of children: Not on file  . Years of education: Not on file  . Highest education level: Not on file  Occupational History  . Not on file  Tobacco Use  . Smoking status: Former Research scientist (life sciences)  . Smokeless tobacco: Never Used  . Tobacco comment: 20 years ago  Substance and Sexual Activity  . Alcohol use: Yes    Comment: ocassional  . Drug use: No  . Sexual activity: Not on file  Other Topics Concern  . Not on file  Social History Narrative  . Not on file   Social Determinants of Health  Financial Resource Strain:   . Difficulty of Paying Living Expenses:   Food Insecurity:   . Worried About Charity fundraiser in the Last Year:   . Arboriculturist in the Last Year:   Transportation Needs:   . Film/video editor (Medical):   Marland Kitchen Lack of Transportation (Non-Medical):   Physical Activity:   . Days of Exercise per Week:   . Minutes of Exercise per Session:   Stress:   . Feeling of Stress :   Social Connections:   . Frequency  of Communication with Friends and Family:   . Frequency of Social Gatherings with Friends and Family:   . Attends Religious Services:   . Active Member of Clubs or Organizations:   . Attends Archivist Meetings:   Marland Kitchen Marital Status:   Intimate Partner Violence:   . Fear of Current or Ex-Partner:   . Emotionally Abused:   Marland Kitchen Physically Abused:   . Sexually Abused:      History reviewed. No pertinent family history.   Current Facility-Administered Medications:  .  [EXPIRED] .TPN (CLINIMIX-E) Adult, , Intravenous, Continuous TPN, Last Rate: 100 mL/hr at 08/08/19 0300, Rate Verify at 08/08/19 0300 **FOLLOWED BY** .TPN (CLINIMIX-E) Adult, , Intravenous, Continuous TPN, Dallie Piles, RPH, Last Rate: 100 mL/hr at 08/07/19 1738, New Bag at 08/07/19 1738 .  Marland KitchenTPN (CLINIMIX-E) Adult, , Intravenous, Continuous TPN **FOLLOWED BY** [START ON 08/09/2019] .TPN (CLINIMIX-E) Adult, , Intravenous, Continuous TPN, Dallie Piles, RPH .  0.9 %  sodium chloride infusion (Manually program via Guardrails IV Fluids), , Intravenous, Once, Sakai, Isami, DO .  0.9 %  sodium chloride infusion, , Intravenous, PRN, Sakai, Isami, DO, Last Rate: 50 mL/hr at 08/08/19 1256, 50 mL at 08/08/19 1256 .  acetaminophen (OFIRMEV) IV 1,000 mg, 1,000 mg, Intravenous, Q6H, Sakai, Isami, DO, Last Rate: 400 mL/hr at 08/08/19 0935, 1,000 mg at 08/08/19 0935 .  acidophilus (RISAQUAD) capsule 1 capsule, 1 capsule, Oral, Daily, Sakai, Isami, DO, 1 capsule at 08/08/19 0946 .  anidulafungin (ERAXIS) 100 mg in sodium chloride 0.9 % 100 mL IVPB, 100 mg, Intravenous, Q24H, Sakai, Isami, DO, Last Rate: 78 mL/hr at 08/08/19 0945, 100 mg at 08/08/19 0945 .  Chlorhexidine Gluconate Cloth 2 % PADS 6 each, 6 each, Topical, Daily, Sakai, Isami, DO, 6 each at 08/08/19 0948 .  cyanocobalamin ((VITAMIN B-12)) injection 1,000 mcg, 1,000 mcg, Intramuscular, Daily, Earlie Server, MD, 1,000 mcg at 08/08/19 [phone] .  diphenhydrAMINE (BENADRYL) injection  12.5 mg, 12.5 mg, Intravenous, Q6H PRN, Sakai, Isami, DO, 12.5 mg at 08/07/19 1047 .  escitalopram (LEXAPRO) tablet 20 mg, 20 mg, Oral, Daily, Sakai, Isami, DO, 20 mg at 08/08/19 0946 .  folic acid injection 1 mg, 1 mg, Intravenous, Daily, Earlie Server, MD, 1 mg at 08/08/19 [phone] .  gabapentin (NEURONTIN) capsule 300 mg, 300 mg, Oral, QHS, Sakai, Isami, DO .  HYDROmorphone (DILAUDID) injection 0.5 mg, 0.5 mg, Intravenous, Q3H PRN, Sakai, Isami, DO, 0.5 mg at 08/08/19 1300 .  insulin aspart (novoLOG) injection 0-9 Units, 0-9 Units, Subcutaneous, Q6H, Sakai, Isami, DO, 1 Units at 08/08/19 1208 .  ipratropium-albuterol (DUONEB) 0.5-2.5 (3) MG/3ML nebulizer solution 3 mL, 3 mL, Nebulization, Q4H PRN, Sakai, Isami, DO, 3 mL at 07/28/19 1205 .  levothyroxine (SYNTHROID, LEVOTHROID) injection 62.5 mcg, 62.5 mcg, Intravenous, Daily, Sakai, Isami, DO, 62.5 mcg at 08/08/19 0936 .  LORazepam (ATIVAN) injection 1 mg, 1 mg, Intravenous, QHS PRN,MR X 1, Sakai, Isami, DO, 1 mg  at 07/30/19 2148 .  menthol-cetylpyridinium (CEPACOL) lozenge 3 mg, 1 lozenge, Oral, Q2H PRN, Sakai, Isami, DO .  meropenem (MERREM) 1 g in sodium chloride 0.9 % 100 mL IVPB, 1 g, Intravenous, Q8H, Leonel Ramsay, MD, Last Rate: 200 mL/hr at 08/08/19 1257, 1 g at 08/08/19 1257 .  metoCLOPramide (REGLAN) injection 5 mg, 5 mg, Intravenous, Q8H PRN, Sakai, Isami, DO, 5 mg at 08/04/19 2017 .  [DISCONTINUED] ondansetron (ZOFRAN) tablet 4 mg, 4 mg, Oral, Q6H PRN **OR** ondansetron (ZOFRAN) injection 4 mg, 4 mg, Intravenous, Q4H PRN, Sakai, Isami, DO, 4 mg at 08/03/19 1744 .  pantoprazole (PROTONIX) injection 40 mg, 40 mg, Intravenous, QHS, Sakai, Isami, DO, 40 mg at 08/07/19 2155 .  promethazine (PHENERGAN) injection 25 mg, 25 mg, Intravenous, Q6H PRN, Sakai, Isami, DO, 25 mg at 08/04/19 0620 .  scopolamine (TRANSDERM-SCOP) 1 MG/3DAYS 1.5 mg, 1 patch, Transdermal, Q72H, Sakai, Isami, DO, 1.5 mg at 08/06/19 1815 .  sodium chloride flush (NS) 0.9 %  injection 10-40 mL, 10-40 mL, Intracatheter, Q12H, Sakai, Isami, DO, 10 mL at 08/08/19 0948 .  sodium chloride flush (NS) 0.9 % injection 10-40 mL, 10-40 mL, Intracatheter, PRN, Sakai, Isami, DO, 10 mL at 08/07/19 1128   Physical exam:  Vitals:   08/07/19 1839 08/07/19 2051 08/08/19 0532 08/08/19 1134  BP: (!) 145/62 129/62 (!) 147/70 134/76  Pulse: (!) 109 (!) 107 (!) 105 94  Resp: 20 16 18 17   Temp: 99.9 F (37.7 C) 98.9 F (37.2 C) 99.4 F (37.4 C) 98.2 F (36.8 C)  TempSrc: Oral Oral Oral Oral  SpO2: 96% 96% 97% 98%  Weight:      Height:       Physical Exam  Constitutional: She is oriented to person, place, and time. No distress.  HENT:  NG tube  Eyes: No scleral icterus.  Cardiovascular: Normal rate.  Pulmonary/Chest: Effort normal.  Abdominal:  Sluggish bowel sounds  Musculoskeletal:     Cervical back: Neck supple.  Neurological: She is alert and oriented to person, place, and time.  Skin: Skin is warm.  Psychiatric: Affect normal.       CMP Latest Ref Rng & Units 08/08/2019  Glucose 70 - 99 mg/dL 138(H)  BUN 6 - 20 mg/dL 8  Creatinine 0.44 - 1.00 mg/dL 0.55  Sodium 135 - 145 mmol/L 137  Potassium 3.5 - 5.1 mmol/L 3.8  Chloride 98 - 111 mmol/L 105  CO2 22 - 32 mmol/L 26  Calcium 8.9 - 10.3 mg/dL 8.1(L)  Total Protein 6.5 - 8.1 g/dL 6.9  Total Bilirubin 0.3 - 1.2 mg/dL 0.3  Alkaline Phos 38 - 126 U/L 97  AST 15 - 41 U/L 31  ALT 0 - 44 U/L 26   CBC Latest Ref Rng & Units 08/08/2019  WBC 4.0 - 10.5 K/uL 19.3(H)  Hemoglobin 12.0 - 15.0 g/dL 7.5(L)  Hematocrit 36.0 - 46.0 % 23.5(L)  Platelets 150 - 400 K/uL 946(HH)    RADIOGRAPHIC STUDIES: I have personally reviewed the radiological images as listed and agreed with the findings in the report. DG Chest 1 View  Result Date: 07/28/2019 CLINICAL DATA:  Tachypnea, recent hysterectomy EXAM: CHEST  1 VIEW COMPARISON:  07/27/2019 chest radiograph. FINDINGS: Low lung volumes. Stable cardiomediastinal silhouette  with normal heart size. No pneumothorax. No pleural effusion. Mild right basilar atelectasis, unchanged. No pulmonary edema. No acute consolidative airspace disease. IMPRESSION: Stable low lung volumes with mild right basilar atelectasis. Electronically Signed   By: Ilona Sorrel  M.D.   On: 07/28/2019 16:45   DG Chest 1 View  Result Date: 07/27/2019 CLINICAL DATA:  Tachypnea.  Recent abdominal surgery. EXAM: CHEST  1 VIEW COMPARISON:  11/03/2010 FINDINGS: Lung volumes are low. There is opacity at the right lung base consistent with atelectasis. Remainder of the lungs is clear. No convincing pleural effusion and no pneumothorax. Cardiac silhouette is normal in size. No mediastinal or hilar masses. Skeletal structures are grossly intact. IMPRESSION: 1. No acute cardiopulmonary disease. 2. Mild right lung base atelectasis. Electronically Signed   By: Lajean Manes M.D.   On: 07/27/2019 16:15   DG Abd 1 View  Result Date: 07/22/2019 CLINICAL DATA:  Evaluate for possible retained sponge EXAM: ABDOMEN - 1 VIEW COMPARISON:  None. FINDINGS: Scattered large and small bowel gas is noted. No abnormal mass or abnormal calcifications are seen. Postsurgical changes are noted. No radiopaque foreign body is identified to suggest retained sponge. IMPRESSION: No evidence of retained foreign body. These results will be called to the ordering clinician or representative by the Radiologist Assistant, and communication documented in the PACS or zVision Dashboard. Electronically Signed   By: Inez Catalina M.D.   On: 07/22/2019 22:49   [place] Abscess Drain  Result Date: 08/02/2019 INDICATION: 44 year old with recent hysterectomy and bowel surgery. Patient has elevated white blood cell count and small intra-abdominal fluid collections. EXAM: ULTRASOUND-GUIDED ASPIRATION OF ABDOMINAL FLUID MEDICATIONS: Fentanyl 25 mcg ANESTHESIA/SEDATION: The patient was continuously monitored during the procedure by the interventional radiology nurse  under my direct supervision. COMPLICATIONS: None immediate. PROCEDURE: Informed written consent was obtained from the patient after a thorough discussion of the procedural risks, benefits and alternatives. All questions were addressed. A timeout was performed prior to the initiation of the procedure. Abdomen was evaluated with ultrasound. Pocket of fluid in the anterior right lower abdomen was targeted just medial to the existing percutaneous drain. Skin was prepped with chlorhexidine and sterile field was created. Skin and soft tissues were anesthetized with 1% lidocaine. Using ultrasound guidance, a Yueh catheter was directed into this peritoneal fluid. Only 3 mL of fluid could be obtained. Unable to aspirate majority of the fluid in this area. Bandage placed over the puncture site. FINDINGS: Small pocket of fluid along the right anterior lower abdomen just medial to the existing percutaneous drain. Needle position confirmed within this small collection but only a small amount of slightly thick red fluid was removed. IMPRESSION: Ultrasound-guided aspiration of fluid from the right anterior abdomen. Only a small amount of slightly thick red fluid could be obtained. Findings could represent blood products or hematoma formation. Fluid was sent for culture. Electronically Signed   By: Markus Daft M.D.   On: 08/02/2019 17:11   [place] Abscess Drain  Result Date: 07/26/2019 INDICATION: History of hysterectomy complicated by small bowel injury requiring resection and primary anastomosis. CT scan of the abdomen and pelvis performed earlier today secondary to elevated white blood cell count and fever demonstrated indeterminate fluid collections within the abdomen with dominant collection within the right lower abdominal quadrant As such, request made for ultrasound-guided paracentesis versus drainage catheter placement for and diagnostic and potentially therapeutic purposes. EXAM: ULTRASOUND GUIDED ABSCESS DRAINAGE  COMPARISON:  CT abdomen and pelvis - earlier same day MEDICATIONS: The patient is currently admitted to the hospital and receiving intravenous antibiotics. The antibiotics were administered within an appropriate time frame prior to the initiation of the procedure. ANESTHESIA/SEDATION: None CONTRAST:  None COMPLICATIONS: None immediate. PROCEDURE: Informed written consent was obtained  from the patient after a discussion of the risks, benefits and alternatives to treatment. The patient was placed supine on her hospital bed and sonographic evaluation was performed of the abdomen demonstrating ill-defined fluid scattered throughout the abdomen with dominant collection within right lower abdominal quadrant measuring approximately 9.6 x 8.2 x 3.9 cm, correlating with the dominant collection seen on preceding abdominal CT image 66, series 2. The procedure was planned. A timeout was performed prior to the initiation of the procedure. The skin overlying the right lower abdomen was prepped and draped in the usual sterile fashion. The overlying soft tissues were anesthetized with 1% lidocaine with epinephrine. Appropriate trajectory was planned with the use of a 22 gauge spinal needle. An 18 gauge trocar needle was advanced into the abscess/fluid collection and a short Amplatz super stiff wire was coiled within the collection. Multiple ultrasound images were saved for procedural documentation purposes Next, the track was dilated ultimately allowing placement of a 10 [place] all-purpose drainage catheter. Next, approximately 150 cc of brown serous ascitic fluid was aspirated. A representative sample was capped and sent to the laboratory for analysis. The tube was connected to a drainage bag and sutured in place. A dressing was placed. The patient tolerated the procedure well without immediate post procedural complication. IMPRESSION: Successful ultrasound guided placement of a 10 [place] all purpose drain catheter into the  dominant collection within the right lower abdomen with aspiration of 150 cc of brown, serous ascitic fluid. Samples were sent to the laboratory as requested by the ordering clinical team. Electronically Signed   By: Sandi Mariscal M.D.   On: 07/26/2019 17:30   CT ABDOMEN PELVIS W CONTRAST  Result Date: 07/31/2019 CLINICAL DATA:  45 yo F s/p exp. Lap and repair of small bowel enterotomy as well as hysterectomy with OB/GYN and surgery involved. Reexploration abdominal washout and resection of original anastomosis today 3/14. EXAM: CT ABDOMEN AND PELVIS WITH CONTRAST TECHNIQUE: Multidetector CT imaging of the abdomen and pelvis was performed using the standard protocol following bolus administration of intravenous contrast. CONTRAST:  140mL OMNIPAQUE IOHEXOL 300 MG/ML  SOLN COMPARISON:  07/26/2019 FINDINGS: Lower chest: Dependent atelectasis, greater on the right, similar to the prior CT. No acute findings. Hepatobiliary: No focal liver abnormality is seen. Status post cholecystectomy. No biliary dilatation. Pancreas: Unremarkable. No pancreatic ductal dilatation or surrounding inflammatory changes. Spleen: Normal in size without focal abnormality. Adrenals/Urinary Tract: No adrenal masses. Stable low-density renal masses consistent with cysts on the left. Mild prominence of the right intrarenal collecting system. No renal or ureteral stones. No left renal collecting system dilation. Normal left ureter. Symmetric renal enhancement and excretion. Bladder minimally distended. Foley catheter in place. No bladder mass. Stomach/Bowel: Normal stomach. Mesenteric edema and inflammation most evident in the central to lower abdomen. Right lower quadrant percutaneously placed drainage catheter significantly decreases the size of the previously seen right lower quadrant collection. Residual collection measures 6.4 x 2.9 cm transversely. Small anterior less well-defined fluid collection is seen, to the right of midline, near the  level of the umbilicus, 6.3 x 1.9 cm transversely. Fluid is noted within leaves of the small bowel mesentery most evident mid to lower abdomen, without additional defined collection. Bowel anastomosis staples are noted upper anterior pelvis extending to the right lower quadrant. No evidence of bowel obstruction. Several loops of central to lower abdomen small bowel demonstrate mild wall thickening. Colon is mostly decompressed. No colonic wall thickening. Vascular/Lymphatic: No vascular abnormality. Several prominent mesenteric lymph nodes  most evident right lower quadrant, all subcentimeter and similar to the prior CT. Reproductive: Status post hysterectomy. No adnexal masses. Other: Anterior abdominal wall air adjacent to the midline incision. No hernia. No abdominal wall collection to suggest abscess. Small amount of ascites. Tiny bubbles of free intraperitoneal air are noted deep to the midline incision. Musculoskeletal: No acute or significant abnormality. IMPRESSION: 1. No bowel obstruction. Mild small bowel wall thickening in the lower abdomen is likely reactive/edema from the recent surgery. 2. Significant improvement the right lower quadrant collection following placement of a percutaneous drainage catheter. 3. No new abdominal collections. Small amount of ascites and diffuse mesenteric edema most evident mid to lower abdomen. 4. Few tiny bubbles of anterior midline free air, presumed postsurgical. Electronically Signed   By: Lajean Manes M.D.   On: 07/31/2019 10:48   CT ABDOMEN PELVIS W CONTRAST  Result Date: 07/26/2019 CLINICAL DATA:  Abdominal pain and tenderness. Leukocytosis. Recent small bowel resection and hysterectomy. EXAM: CT ABDOMEN AND PELVIS WITH CONTRAST TECHNIQUE: Multidetector CT imaging of the abdomen and pelvis was performed using the standard protocol following bolus administration of intravenous contrast. CONTRAST:  122mL OMNIPAQUE IOHEXOL 300 MG/ML  SOLN COMPARISON:  06/21/2019  FINDINGS: Lower Chest: New bibasilar atelectasis. Hepatobiliary: No hepatic masses identified. Prior cholecystectomy. No evidence of biliary obstruction. Pancreas:  No mass or inflammatory changes. Spleen: Within normal limits in size and appearance. Adrenals/Urinary Tract: Stable tiny bilateral renal cysts. No masses identified. No evidence of ureteral calculi or hydronephrosis. Stomach/Bowel: A small amount of extraperitoneal air in the lower pelvis, and intraperitoneal air, are consistent with recent surgery. No evidence of bowel obstruction. Mildly dilated small bowel loops are seen containing air-fluid levels and increased gaseous distention of colon is also seen, suspicious for adynamic ileus. Diffuse mesenteric inflammatory changes or edema are new since previous study. Multiple small loculated fluid intraperitoneal fluid collections are seen in the abdomen and pelvis, 1 in the right lower quadrant measuring 9.8 x 5.1 cm on image 66/2. Vascular/Lymphatic: No pathologically enlarged lymph nodes. No abdominal aortic aneurysm. Reproductive: Prior hysterectomy noted. Rim enhancing fluid collection seen in the hysterectomy bed which measures 8.0 x 7.4 cm on image 67/7. Other:  None. Musculoskeletal:  No suspicious bone lesions identified. IMPRESSION: 1. Small amount of intraperitoneal and extraperitoneal air, consistent with recent surgery. 2. Multiple loculated intraperitoneal fluid collections in the abdomen and pelvis, largest in the right lower quadrant measuring 9.8 cm and pelvic cul-de-sac measuring 8.0 cm, suspicious for abscesses. 3. Mild diffuse dilatation of small bowel and colon, suspicious for adynamic ileus. 4. New bibasilar atelectasis. Electronically Signed   By: Marlaine Hind M.D.   On: 07/26/2019 13:38   US Abdomen Limited  Result Date: 07/30/2019 CLINICAL DATA:  Drainage from abdominal wall incision site for hysterectomy. Evaluate for abscess. EXAM: LIMITED ULTRASOUND OF ABDOMINAL SOFT TISSUES  TECHNIQUE: Ultrasound examination of the anterior abdominal wall soft tissues was performed in the area of clinical concern at the previous incision site. COMPARISON:  None. FINDINGS: A small fluid collection is seen within the subcutaneous tissues of the midline abdominal wall just above the umbilicus which measures 0.5 x 0.5 x 1.1 cm. A 2nd small complex fluid collection is seen just below the level of the umbilicus which measures 1.3 x 0.5 x 1.1 cm. IMPRESSION: Two small approximately 1 cm complex fluid collections are identified in the midline abdominal wall subcutaneous tissues. These may represent postop seromas or abscesses. Electronically Signed   By: Marlaine Hind  M.D.   On: 07/30/2019 11:41   US Venous Img Lower Bilateral (DVT)  Result Date: 07/27/2019 CLINICAL DATA:  Tachycardia.  History of recent surgery. EXAM: BILATERAL LOWER EXTREMITY VENOUS DOPPLER ULTRASOUND TECHNIQUE: Gray-scale sonography with compression, as well as color and duplex ultrasound, were performed to evaluate the deep venous system(s) from the level of the common femoral vein through the popliteal and proximal calf veins. COMPARISON:  None. FINDINGS: VENOUS Normal compressibility of the common femoral, superficial femoral, and popliteal veins, as well as the visualized calf veins. Visualized portions of profunda femoral vein and great saphenous vein unremarkable. No filling defects to suggest DVT on grayscale or color Doppler imaging. Doppler waveforms show normal direction of venous flow, normal respiratory phasicity and response to augmentation. Limited views of the contralateral common femoral vein are unremarkable. OTHER Edema noted in the legs bilaterally. Limitations: none IMPRESSION: No femoropopliteal DVT nor evidence of DVT within the visualized calf veins. If clinical symptoms are inconsistent or if there are persistent or worsening symptoms, further imaging (possibly involving the iliac veins) may be warranted.  Electronically Signed   By: Lajean Manes M.D.   On: 07/27/2019 16:16   [place] EKG SITE RITE  Result Date: 07/30/2019 If Site Rite image not attached, placement could not be confirmed due to current cardiac rhythm.   Assessment and plan-   #Thrombocytosis, likely secondary to acute infection/inflammation and underlying iron deficiency. Platelet counts slightly trended down  #Anemia, status post 1 unit of PRBC transfusion. Hemoglobin 7.5 today. Iron deficiency, status post IV Venofer 200 mg x 3  #Vitamin B12 deficiency, start vitamin B12 100 MCG IM daily x 5 #Folate deficiency, start folic acid 1 mg IV daily x3  #Tachycardia, improved #Leukocytosis due to surgery/infection/inflammation.  Continue antibiotics.  Patient is on TPN.  Fungal coverage. Leukocytosis trending down.  DVT prophylaxis with Lovenox.  Earlie Server, MD, PhD Hematology Oncology [facility] at [facility] Pager- [phone] 08/08/2019

## 2019-08-09 DIAGNOSIS — T8143XA Infection following a procedure, organ and space surgical site, initial encounter: Secondary | ICD-10-CM

## 2019-08-09 DIAGNOSIS — T8130XA Disruption of wound, unspecified, initial encounter: Secondary | ICD-10-CM

## 2019-08-09 DIAGNOSIS — Z933 Colostomy status: Secondary | ICD-10-CM

## 2019-08-09 DIAGNOSIS — Z932 Ileostomy status: Secondary | ICD-10-CM

## 2019-08-09 DIAGNOSIS — Z90710 Acquired absence of both cervix and uterus: Secondary | ICD-10-CM

## 2019-08-09 DIAGNOSIS — Z978 Presence of other specified devices: Secondary | ICD-10-CM

## 2019-08-09 DIAGNOSIS — D72829 Elevated white blood cell count, unspecified: Secondary | ICD-10-CM

## 2019-08-09 DIAGNOSIS — D696 Thrombocytopenia, unspecified: Secondary | ICD-10-CM

## 2019-08-09 DIAGNOSIS — Z960 Presence of urogenital implants: Secondary | ICD-10-CM

## 2019-08-09 LAB — CBC WITH DIFFERENTIAL/PLATELET
Abs Immature Granulocytes: 1.22 10*3/uL — ABNORMAL HIGH (ref 0.00–0.07)
Basophils Absolute: 0.1 10*3/uL (ref 0.0–0.1)
Basophils Relative: 1 %
Eosinophils Absolute: 0.3 10*3/uL (ref 0.0–0.5)
Eosinophils Relative: 2 %
HCT: 25 % — ABNORMAL LOW (ref 36.0–46.0)
Hemoglobin: 7.7 g/dL — ABNORMAL LOW (ref 12.0–15.0)
Immature Granulocytes: 9 %
Lymphocytes Relative: 19 %
Lymphs Abs: 2.6 10*3/uL (ref 0.7–4.0)
MCH: 28.9 pg (ref 26.0–34.0)
MCHC: 30.8 g/dL (ref 30.0–36.0)
MCV: 94 fL (ref 80.0–100.0)
Monocytes Absolute: 1.3 10*3/uL — ABNORMAL HIGH (ref 0.1–1.0)
Monocytes Relative: 9 %
Neutro Abs: 8.1 10*3/uL — ABNORMAL HIGH (ref 1.7–7.7)
Neutrophils Relative %: 60 %
Platelets: 899 10*3/uL — ABNORMAL HIGH (ref 150–400)
RBC: 2.66 MIL/uL — ABNORMAL LOW (ref 3.87–5.11)
RDW: 15.3 % (ref 11.5–15.5)
Smear Review: INCREASED
WBC: 13.5 10*3/uL — ABNORMAL HIGH (ref 4.0–10.5)
nRBC: 1.4 % — ABNORMAL HIGH (ref 0.0–0.2)

## 2019-08-09 LAB — BASIC METABOLIC PANEL
Anion gap: 7 (ref 5–15)
BUN: 9 mg/dL (ref 6–20)
CO2: 25 mmol/L (ref 22–32)
Calcium: 8.1 mg/dL — ABNORMAL LOW (ref 8.9–10.3)
Chloride: 104 mmol/L (ref 98–111)
Creatinine, Ser: 0.44 mg/dL (ref 0.44–1.00)
GFR calc Af Amer: >60 mL/min (ref >60)
GFR calc non Af Amer: >60 mL/min (ref >60)
Glucose, Bld: 102 mg/dL — ABNORMAL HIGH (ref 70–99)
Potassium: 4.1 mmol/L (ref 3.5–5.1)
Sodium: 136 mmol/L (ref 135–145)

## 2019-08-09 LAB — GLUCOSE, CAPILLARY
Glucose-Capillary: 118 mg/dL — ABNORMAL HIGH (ref 70–99)
Glucose-Capillary: 137 mg/dL — ABNORMAL HIGH (ref 70–99)
Glucose-Capillary: 129 mg/dL — ABNORMAL HIGH (ref 70–99)

## 2019-08-09 LAB — PHOSPHORUS: Phosphorus: 3.7 mg/dL (ref 2.5–4.6)

## 2019-08-09 MED ORDER — ENOXAPARIN SODIUM 40 MG/0.4ML ~~LOC~~ SOLN
40.0000 mg | Freq: Two times a day (BID) | SUBCUTANEOUS | Status: DC
  Administered 2019-08-09 – 2019-08-15 (×12): 40 mg via SUBCUTANEOUS
  Filled 2019-08-09 (×12): qty 0.4

## 2019-08-09 MED ORDER — TRACE MINERALS CU-MN-SE-ZN 300-55-60-3000 MCG/ML IV SOLN
INTRAVENOUS | Status: AC
  Filled 2019-08-09: qty 1000

## 2019-08-09 MED ORDER — TRACE MINERALS CU-MN-SE-ZN 300-55-60-3000 MCG/ML IV SOLN
INTRAVENOUS | Status: DC
  Filled 2019-08-09: qty 2000

## 2019-08-09 MED ORDER — CLINIMIX E/DEXTROSE (5/20) 5 % IV SOLN
INTRAVENOUS | Status: AC
  Filled 2019-08-09: qty 1000

## 2019-08-09 MED ORDER — CLINIMIX E/DEXTROSE (5/20) 5 % IV SOLN
INTRAVENOUS | Status: DC
  Filled 2019-08-09: qty 400

## 2019-08-09 MED ORDER — ENOXAPARIN SODIUM 40 MG/0.4ML ~~LOC~~ SOLN: 40.0000 mg | SUBCUTANEOUS | Status: DC

## 2019-08-09 MED ORDER — FAT EMULSION PLANT BASED 20 % IV EMUL
360.0000 mL | INTRAVENOUS | Status: AC
  Administered 2019-08-09: 360 mL via INTRAVENOUS
  Filled 2019-08-09: qty 360

## 2019-08-09 MED ORDER — SALINE SPRAY 0.65 % NA SOLN
1.0000 | NASAL | Status: DC | PRN
  Administered 2019-08-16 – 2019-08-18 (×2): 1 via NASAL
  Filled 2019-08-09 (×2): qty 44

## 2019-08-09 NOTE — Progress Notes (Signed)
PHARMACY - TOTAL PARENTERAL NUTRITION CONSULT NOTE   Indication:  s/p laparoscopic converted to open total hysterectomy, left salpingectomy, cystoscopy and small bowel resection (near distal ileum) secondary to bowel injury 3/8. Pt declined post operatively requiring another exploratory laparotomy, lysis of adhesions, abdominal wound washout, resection of previous anastomosis with a portion small bowel removed and subsequent reanastomosis 3/14, now  second anastamosis failure and revision 3/23  Patient Measurements: Height: 5\' 2"  (157.5 cm) Weight: 248 lb 3.8 oz (112.6 kg) IBW/kg (Calculated) : 50.1 TPN AdjBW (KG): 70.9 Body mass index is 45.4 kg/m.  Assessment: 44 y.o. female  s/p exploratory laparotomy and repair of small bowel enterotomy as well as hysterectomy with OB/GYN  Post-Op re-exploration abdominal washout and resection of original anastomosis x 2, hospital day #17  Glucose / Insulin: BG 118 - 139, continue SSI q6h, 2 units/ 24 hrs  Electrolytes: WNL Renal: Scr <1, stable LFTs / TGs: TG 198-->166 Prealbumin / albumin: 13.6/2.6 Intake / Output: 3495/2120  Central access:  07/30/19 TPN start date: 07/30/19  Nutritional Goals per RD recommendation revised 3/24 Kcal:  2832kcal/day Protein:  120g/day Goal TPN rate is 100 mL/hr (Regimen at goal rate will provide 2832kcal/day, 100g/day protein, 2760 ml volume )  Fluid: none  ABX: Eraxis 3/19>> Zosyn 3/12 >> 3/24 Meropenem 3/24 >>  Current Nutrition:  NPO  Plan:   continue Clinimix 5/20 with electrolytes at 100 ml/hr  Add MVI on Mondays and Thursdays, trace elements daily  Continue 20% lipids @30ml /hr x 12 hrs/day   Continue sensitive SSI q6h and adjust as needed   Monitor TPN labs on Mon/Thurs, per protocol and electrolytes daily for now  Dallie Piles, PharmD, BCPS Clinical Pharmacist 08/09/2019 7:07 AM

## 2019-08-09 NOTE — Progress Notes (Signed)
Subjective:  CC: Karen Aguirre is a 44 y.o. female  Hospital stay day 18,  exploratory laparotomy and repair of small bowel enterotomy as well as hysterectomy with OB/GYN, repeat ex-lap and re-anastamosis for failed anastamosis  4 Days Post-Op  Reexploration abdominal washout, LOA, end ileostomy and drain placement.  HPI: No acute issues overnight.  Some nausea reported with clamp trial yesterday so continued NG tube.  ROS:  General: Denies weight loss, weight gain, fatigue, fevers, chills, and night sweats. Heart: Denies chest pain, palpitations, racing heart, irregular heartbeat, leg pain or swelling, and decreased activity tolerance. Respiratory: Denies breathing difficulty, shortness of breath, wheezing, cough, and sputum. GI: Denies change in appetite, heartburn, constipation, diarrhea, and blood in stool. GU: Denies difficulty urinating, pain with urinating, urgency, frequency, blood in urine.   Objective:   Temp:  [98.4 F (36.9 C)-99.2 F (37.3 C)] 99.2 F (37.3 C) (03/26 2138) Pulse Rate:  [91-93] 93 (03/26 2138) Resp:  [14-18] 18 (03/26 2138) BP: (132-166)/(67-85) 166/85 (03/26 2138) SpO2:  [96 %-98 %] 97 % (03/26 2138) Weight:  [112.6 kg] 112.6 kg (03/26 0442)     Height: 5\' 2"  (157.5 cm) Weight: 112.6 kg BMI (Calculated): 45.39   Intake/Output this shift:   Intake/Output Summary (Last 24 hours) at 08/09/2019 2200 Last data filed at 08/09/2019 2138 Gross per 24 hour  Intake 3060.28 ml  Output 3400 ml  Net -339.72 ml    Constitutional :  alert, cooperative, appears stated age and mild distress  Respiratory:  clear to auscultation bilaterally  Cardiovascular:  regular rate and rhythm  Gastrointestinal: soft, focal guarding around incision, ostomy still dark, dusky mucus with patches of pink.  Thin bilious output in bag.  malecot drain with minimal mucus output.  NG with thin bilious output, IR drain with serosanguinous output.  staples intact, some serosanguinous  drainge around retention sutures.  Skin: Cool and moist.   Psychiatric: Normal affect, non-agitated, not confused       LABS:  CMP Latest Ref Rng & Units 08/09/2019 08/08/2019 08/07/2019  Glucose 70 - 99 mg/dL 102(H) 138(H) 129(H)  BUN 6 - 20 mg/dL 9 8 11   Creatinine 0.44 - 1.00 mg/dL 0.44 0.55 0.54  Sodium 135 - 145 mmol/L 136 137 135  Potassium 3.5 - 5.1 mmol/L 4.1 3.8 3.8  Chloride 98 - 111 mmol/L 104 105 100  CO2 22 - 32 mmol/L 25 26 27   Calcium 8.9 - 10.3 mg/dL 8.1(L) 8.1(L) 7.8(L)  Total Protein 6.5 - 8.1 g/dL - 6.9 -  Total Bilirubin 0.3 - 1.2 mg/dL - 0.3 -  Alkaline Phos 38 - 126 U/L - 97 -  AST 15 - 41 U/L - 31 -  ALT 0 - 44 U/L - 26 -   CBC Latest Ref Rng & Units 08/09/2019 08/08/2019 08/07/2019  WBC 4.0 - 10.5 K/uL 13.5(H) 19.3(H) -  Hemoglobin 12.0 - 15.0 g/dL 7.7(L) 7.5(L) 7.5(L)  Hematocrit 36.0 - 46.0 % 25.0(L) 23.5(L) 23.9(L)  Platelets 150 - 400 K/uL 899(H) 946(HH) -    RADS: n/a Assessment:   exploratory laparotomy and repair of small bowel enterotomy as well as hysterectomy with OB/GYN, repeat ex-lap and re-anastamosis for failed anastamosis. Reexploration for dehiscence, second anastamosis failure, abdominal washout, LOA, end ileostomy and drain placement.  Will try NG clamp trial again and remove NG, advance to clears if tolerates  Pain level continues to improve.  TPN continued until adequate return of bowel function is noted and ostomy continue to look better.  Labs all downtrending  Results came back with some resistant organisms, ID extended antibiotics coverage to [place].  Appreciate their expertise.  All questions addressed

## 2019-08-09 NOTE — Progress Notes (Signed)
Patient admitted for pelvic inflammatory disease s/p robotic assisted ex. Lap. Post-op day 4. Patient's hgb 7.7 from 5.7 on admission, do not have a further out baseline to compare. No PMH of anemia from what I can tell, patient's BMI is 45.4 m^2 and CrCl > 30 ml/min will readjust dose of lovenox to 40 mg subq twice daily considering patient is still young and is s/p sx. Will continue to monitor and adjust as needed.  Tobie Lords, PharmD, BCPS Clinical Pharmacist 08/09/2019

## 2019-08-09 NOTE — Progress Notes (Signed)
Subjective:  CC: Karen Aguirre is a 44 y.o. female  Hospital stay day 17,  exploratory laparotomy and repair of small bowel enterotomy as well as hysterectomy with OB/GYN, repeat ex-lap and re-anastamosis for failed anastomoses  3 Days Post-Op  Reexploration abdominal washout, LOA, end ileostomy and drain placement.  HPI: No acute issues overnight.  ROS:  General: Denies weight loss, weight gain, fatigue, fevers, chills, and night sweats. Heart: Denies chest pain, palpitations, racing heart, irregular heartbeat, leg pain or swelling, and decreased activity tolerance. Respiratory: Denies breathing difficulty, shortness of breath, wheezing, cough, and sputum. GI: Denies change in appetite, heartburn, constipation, diarrhea, and blood in stool. GU: Denies difficulty urinating, pain with urinating, urgency, frequency, blood in urine.   Objective:   Temp:  [98.4 F (36.9 C)-99.2 F (37.3 C)] 99.2 F (37.3 C) (03/26 2138) Pulse Rate:  [91-93] 93 (03/26 2138) Resp:  [14-18] 18 (03/26 2138) BP: (132-166)/(67-85) 166/85 (03/26 2138) SpO2:  [96 %-98 %] 97 % (03/26 2138) Weight:  [112.6 kg] 112.6 kg (03/26 0442)     Height: 5\' 2"  (157.5 cm) Weight: 112.6 kg BMI (Calculated): 45.39   Intake/Output this shift:   Intake/Output Summary (Last 24 hours) at 08/09/2019 2153 Last data filed at 08/09/2019 2138 Gross per 24 hour  Intake 3060.28 ml  Output 3400 ml  Net -339.72 ml    Constitutional :  alert, cooperative, appears stated age and mild distress  Respiratory:  clear to auscultation bilaterally  Cardiovascular:  regular rate and rhythm  Gastrointestinal: soft, focal guarding around incision, ostomy with less swelling and improving color. Thin bilious output in bag.  malecot drain with minimal mucus output.  NG with thin bilious output, IR drain with serosanguinous output.  staples intact, some serosanguinous drainge around retention sutures.  Skin: Cool and moist.   Psychiatric: Normal  affect, non-agitated, not confused       LABS:  CMP Latest Ref Rng & Units 08/09/2019 08/08/2019 08/07/2019  Glucose 70 - 99 mg/dL 102(H) 138(H) 129(H)  BUN 6 - 20 mg/dL 9 8 11   Creatinine 0.44 - 1.00 mg/dL 0.44 0.55 0.54  Sodium 135 - 145 mmol/L 136 137 135  Potassium 3.5 - 5.1 mmol/L 4.1 3.8 3.8  Chloride 98 - 111 mmol/L 104 105 100  CO2 22 - 32 mmol/L 25 26 27   Calcium 8.9 - 10.3 mg/dL 8.1(L) 8.1(L) 7.8(L)  Total Protein 6.5 - 8.1 g/dL - 6.9 -  Total Bilirubin 0.3 - 1.2 mg/dL - 0.3 -  Alkaline Phos 38 - 126 U/L - 97 -  AST 15 - 41 U/L - 31 -  ALT 0 - 44 U/L - 26 -   CBC Latest Ref Rng & Units 08/09/2019 08/08/2019 08/07/2019  WBC 4.0 - 10.5 K/uL 13.5(H) 19.3(H) -  Hemoglobin 12.0 - 15.0 g/dL 7.7(L) 7.5(L) 7.5(L)  Hematocrit 36.0 - 46.0 % 25.0(L) 23.5(L) 23.9(L)  Platelets 150 - 400 K/uL 899(H) 946(HH) -    RADS: n/a Assessment:   exploratory laparotomy and repair of small bowel enterotomy as well as hysterectomy with OB/GYN, repeat ex-lap and re-anastamosis for failed anastamosis. Reexploration for dehiscence, second anastamosis failure, abdominal washout, LOA, end ileostomy and drain placement.  NG clamp trial to see if tolerates.  Pain level slowly but surely improving.  TPN continued until adequate return of bowel function is noted and ostomy continue to look better.   Drain continues to be serous. IR drain showing no signs of active bleeding  Hgb stable.  Culture Results  came back with some resistant organisms, ID extended antibiotics coverage to [place].  Appreciate their expertise.

## 2019-08-09 NOTE — Plan of Care (Signed)
Continuing with plan of care. 

## 2019-08-09 NOTE — Progress Notes (Signed)
ID  3/8 robot assisted lap hysterectomy was attempted for fibroid uterus complicated by SB perforation leading to laparotomy with TAH and repair of SB perforation by surgeon. Started on zosyn on 07/26/19 Anastomotic leak- hence underwent on 07/28/19 Exp lap and lysis of adhesions, resection of previous anastomosis and subsequent reanastomosis. Seen by ID dr.Fitzgerald on 07/31/19 for leucocytosis. Started on echinocandin on 3/19/21and zosyn continued  Wound dehiscence and anastomotic leak= taken for another exp lap on 08/06/19 and small bowel resection, ileostomy creation, end colostomy drain placement Zosyn changed to Meropenem on 08/07/19 as MDR e.coli  Pt has NG tube Rt PICC Ileostomy 2 abdominal drain Foley  Pt says she is feeling better  Taking ice chips Abdominal pain better  o/e  Patient Vitals for the past 24 hrs:  BP Temp Temp src Pulse Resp SpO2 Weight  08/09/19 1131 132/67 98.4 F (36.9 C) Oral 91 14 96 % --  08/09/19 0442 -- -- -- -- -- -- 112.6 kg  08/09/19 0420 (!) 141/77 98.4 F (36.9 C) Oral 91 18 98 % --  08/08/19 2004 (!) 146/76 98.4 F (36.9 C) Oral 95 -- 98 % --   No distress, ill Chest B/l air entry HS s1s2 Abd soft- 2 drains Ileostomy Foley Rt PICc  CBC Latest Ref Rng & Units 08/09/2019 08/08/2019 08/07/2019  WBC 4.0 - 10.5 K/uL 13.5(H) 19.3(H) -  Hemoglobin 12.0 - 15.0 g/dL 7.7(L) 7.5(L) 7.5(L)  Hematocrit 36.0 - 46.0 % 25.0(L) 23.5(L) 23.9(L)  Platelets 150 - 400 K/uL 899(H) 946(HH) -    CMP Latest Ref Rng & Units 08/09/2019 08/08/2019 08/07/2019  Glucose 70 - 99 mg/dL 102(H) 138(H) 129(H)  BUN 6 - 20 mg/dL 9 8 11   Creatinine 0.44 - 1.00 mg/dL 0.44 0.55 0.54  Sodium 135 - 145 mmol/L 136 137 135  Potassium 3.5 - 5.1 mmol/L 4.1 3.8 3.8  Chloride 98 - 111 mmol/L 104 105 100  CO2 22 - 32 mmol/L 25 26 27   Calcium 8.9 - 10.3 mg/dL 8.1(L) 8.1(L) 7.8(L)  Total Protein 6.5 - 8.1 g/dL - 6.9 -  Total Bilirubin 0.3 - 1.2 mg/dL - 0.3 -  Alkaline Phos 38 -  126 U/L - 97 -  AST 15 - 41 U/L - 31 -  ALT 0 - 44 U/L - 26 -   3/22 Micro- yeast and e.coli   Impression/Recommendation  Complicated surgical History with attempted robotic hysterectomy causing SB perforation leading to repair on 3/8 Followed by leak and exp lap on 3/14, followed by dehiscence and exp lap and ileostomy on 08/06/19  E.coli and yeast in cultures on meropenem and anidulafungin Leucocytosis and thrombocytosis improving Continue  current antibiotics Discussed with family

## 2019-08-10 LAB — CBC WITH DIFFERENTIAL/PLATELET
Abs Immature Granulocytes: 1.06 10*3/uL — ABNORMAL HIGH (ref 0.00–0.07)
Basophils Absolute: 0.1 10*3/uL (ref 0.0–0.1)
Basophils Relative: 0 %
Eosinophils Absolute: 0.1 10*3/uL (ref 0.0–0.5)
Eosinophils Relative: 1 %
HCT: 27.1 % — ABNORMAL LOW (ref 36.0–46.0)
Hemoglobin: 8.4 g/dL — ABNORMAL LOW (ref 12.0–15.0)
Immature Granulocytes: 7 %
Lymphocytes Relative: 13 %
Lymphs Abs: 2.1 10*3/uL (ref 0.7–4.0)
MCH: 28.9 pg (ref 26.0–34.0)
MCHC: 31 g/dL (ref 30.0–36.0)
MCV: 93.1 fL (ref 80.0–100.0)
Monocytes Absolute: 1 10*3/uL (ref 0.1–1.0)
Monocytes Relative: 6 %
Neutro Abs: 11 10*3/uL — ABNORMAL HIGH (ref 1.7–7.7)
Neutrophils Relative %: 73 %
Platelets: 885 10*3/uL — ABNORMAL HIGH (ref 150–400)
RBC: 2.91 MIL/uL — ABNORMAL LOW (ref 3.87–5.11)
RDW: 15.8 % — ABNORMAL HIGH (ref 11.5–15.5)
Smear Review: INCREASED
WBC: 15.3 10*3/uL — ABNORMAL HIGH (ref 4.0–10.5)
nRBC: 0.4 % — ABNORMAL HIGH (ref 0.0–0.2)

## 2019-08-10 LAB — BASIC METABOLIC PANEL
Anion gap: 9 (ref 5–15)
BUN: 9 mg/dL (ref 6–20)
CO2: 24 mmol/L (ref 22–32)
Calcium: 8.2 mg/dL — ABNORMAL LOW (ref 8.9–10.3)
Chloride: 99 mmol/L (ref 98–111)
Creatinine, Ser: 0.47 mg/dL (ref 0.44–1.00)
GFR calc Af Amer: >60 mL/min (ref >60)
GFR calc non Af Amer: >60 mL/min (ref >60)
Glucose, Bld: 172 mg/dL — ABNORMAL HIGH (ref 70–99)
Potassium: 4.5 mmol/L (ref 3.5–5.1)
Sodium: 132 mmol/L — ABNORMAL LOW (ref 135–145)

## 2019-08-10 LAB — GLUCOSE, CAPILLARY
Glucose-Capillary: 110 mg/dL — ABNORMAL HIGH (ref 70–99)
Glucose-Capillary: 129 mg/dL — ABNORMAL HIGH (ref 70–99)
Glucose-Capillary: 153 mg/dL — ABNORMAL HIGH (ref 70–99)
Glucose-Capillary: 162 mg/dL — ABNORMAL HIGH (ref 70–99)
Glucose-Capillary: 165 mg/dL — ABNORMAL HIGH (ref 70–99)

## 2019-08-10 LAB — PHOSPHORUS: Phosphorus: 3.1 mg/dL (ref 2.5–4.6)

## 2019-08-10 MED ORDER — CLINIMIX E/DEXTROSE (5/20) 5 % IV SOLN
INTRAVENOUS | Status: AC
  Filled 2019-08-10 (×5): qty 1000

## 2019-08-10 MED ORDER — CLINIMIX E/DEXTROSE (5/20) 5 % IV SOLN
INTRAVENOUS | Status: AC
  Filled 2019-08-10: qty 1000

## 2019-08-10 MED ORDER — TRACE MINERALS CU-MN-SE-ZN 300-55-60-3000 MCG/ML IV SOLN
INTRAVENOUS | Status: AC
  Filled 2019-08-10: qty 1000

## 2019-08-10 MED ORDER — FAT EMULSION PLANT BASED 20 % IV EMUL
360.0000 mL | INTRAVENOUS | Status: AC
  Administered 2019-08-10: 360 mL via INTRAVENOUS
  Filled 2019-08-10: qty 360

## 2019-08-10 NOTE — Progress Notes (Signed)
Subjective:  CC: Karen Aguirre is a 44 y.o. female  Hospital stay day 19,  exploratory laparotomy and repair of small bowel enterotomy as well as hysterectomy with OB/GYN, repeat ex-lap and re-anastamosis for failed anastamosis  5 Days Post-Op  Reexploration abdominal washout, LOA, end ileostomy and drain placement.  HPI: No acute issues overnight.  Some nausea reported with minimal p.o. intake.    Objective:   Temp:  [98 F (36.7 C)-99.2 F (37.3 C)] 98 F (36.7 C) (03/27 1123) Pulse Rate:  [85-93] 92 (03/27 1500) Resp:  [18-20] 20 (03/27 0542) BP: (138-195)/(78-96) 138/84 (03/27 1500) SpO2:  [97 %] 97 % (03/27 1123)     Height: 5\' 2"  (157.5 cm) Weight: 112.6 kg BMI (Calculated): 45.39   Intake/Output this shift:   Intake/Output Summary (Last 24 hours) at 08/10/2019 1613 Last data filed at 08/10/2019 1500 Gross per 24 hour  Intake 2670.32 ml  Output 1840 ml  Net 830.32 ml    Constitutional :   She is keeping her eyes closed, but responding to questions.  Just received pain medication and antiemetic.  Respiratory:  clear to auscultation bilaterally  Cardiovascular:  regular rate and rhythm  Gastrointestinal: soft, focal guarding around incision, ostomy still dark, dusky mucosa.  Thin dark serous output in bag.  malecot drain with minimal mucus output. IR drain with serosanguinous output.  staples intact, some serosanguinous drainge around retention sutures.  Skin: Cool and moist.   Psychiatric:  Depressed/blunted affect, non-agitated, not confused       LABS:  CMP Latest Ref Rng & Units 08/10/2019 08/09/2019 08/08/2019  Glucose 70 - 99 mg/dL 172(H) 102(H) 138(H)  BUN 6 - 20 mg/dL 9 9 8   Creatinine 0.44 - 1.00 mg/dL 0.47 0.44 0.55  Sodium 135 - 145 mmol/L 132(L) 136 137  Potassium 3.5 - 5.1 mmol/L 4.5 4.1 3.8  Chloride 98 - 111 mmol/L 99 104 105  CO2 22 - 32 mmol/L 24 25 26   Calcium 8.9 - 10.3 mg/dL 8.2(L) 8.1(L) 8.1(L)  Total Protein 6.5 - 8.1 g/dL - - 6.9  Total  Bilirubin 0.3 - 1.2 mg/dL - - 0.3  Alkaline Phos 38 - 126 U/L - - 97  AST 15 - 41 U/L - - 31  ALT 0 - 44 U/L - - 26   CBC Latest Ref Rng & Units 08/10/2019 08/09/2019 08/08/2019  WBC 4.0 - 10.5 K/uL 15.3(H) 13.5(H) 19.3(H)  Hemoglobin 12.0 - 15.0 g/dL 8.4(L) 7.7(L) 7.5(L)  Hematocrit 36.0 - 46.0 % 27.1(L) 25.0(L) 23.5(L)  Platelets 150 - 400 K/uL 885(H) 899(H) 946(HH)    RADS: n/a Assessment:   exploratory laparotomy and repair of small bowel enterotomy as well as hysterectomy with OB/GYN, repeat ex-lap and re-anastamosis for failed anastamosis. Reexploration for dehiscence, second anastamosis failure, abdominal washout, LOA, end ileostomy and drain placement.  Continue clears as tolerated  TPN continued until adequate return of bowel function is noted and ostomy continue to look better.    WBC trend halted, will monitor.   ID extended antibiotics coverage to [place].   All questions addressed, husband and presumed mother present.

## 2019-08-10 NOTE — Plan of Care (Signed)
Continuing with plan of care. 

## 2019-08-10 NOTE — Progress Notes (Signed)
PHARMACY - TOTAL PARENTERAL NUTRITION CONSULT NOTE   Indication:  s/p laparoscopic converted to open total hysterectomy, left salpingectomy, cystoscopy and small bowel resection (near distal ileum) secondary to bowel injury 3/8. Pt declined post operatively requiring another exploratory laparotomy, lysis of adhesions, abdominal wound washout, resection of previous anastomosis with a portion small bowel removed and subsequent reanastomosis 3/14, now  second anastamosis failure and revision 3/23  Patient Measurements: Height: 5\' 2"  (157.5 cm) Weight: 248 lb 3.8 oz (112.6 kg) IBW/kg (Calculated) : 50.1 TPN AdjBW (KG): 70.9 Body mass index is 45.4 kg/m.  Assessment: 44 y.o. female  s/p exploratory laparotomy and repair of small bowel enterotomy as well as hysterectomy with OB/GYN  Post-Op re-exploration abdominal washout and resection of original anastomosis x 2, hospital day #17  Glucose / Insulin: BG 118 - 139, continue SSI q6h, 2 units/ 24 hrs  Electrolytes: WNL Renal: Scr <1, stable LFTs / TGs: TG 198-->166 Prealbumin / albumin: 13.6/2.6 Intake / Output: 3495/2120  Central access:  07/30/19 TPN start date: 07/30/19  Nutritional Goals per RD recommendation revised 3/24 Kcal:  2832kcal/day Protein:  120g/day Goal TPN rate is 100 mL/hr (Regimen at goal rate will provide 2832kcal/day, 100g/day protein, 2760 ml volume )  Fluid: none  ABX: Eraxis 3/19>> Zosyn 3/12 >> 3/24 Meropenem 3/24 >>  Current Nutrition:  NPO  Plan:   continue Clinimix 5/20 with electrolytes at 100 ml/hr  Add MVI on Mondays and Thursdays, trace elements daily  Continue 20% lipids @30ml /hr x 12 hrs/day   Continue sensitive SSI q6h and adjust as needed   Monitor TPN labs on Mon/Thurs, per protocol and electrolytes daily for now  Oswald Hillock, PharmD, BCPS Clinical Pharmacist 08/10/2019 11:29 AM

## 2019-08-11 LAB — BASIC METABOLIC PANEL
Anion gap: 9 (ref 5–15)
BUN: 11 mg/dL (ref 6–20)
CO2: 25 mmol/L (ref 22–32)
Calcium: 8.3 mg/dL — ABNORMAL LOW (ref 8.9–10.3)
Chloride: 98 mmol/L (ref 98–111)
Creatinine, Ser: 0.38 mg/dL — ABNORMAL LOW (ref 0.44–1.00)
GFR calc Af Amer: >60 mL/min (ref >60)
GFR calc non Af Amer: >60 mL/min (ref >60)
Glucose, Bld: 127 mg/dL — ABNORMAL HIGH (ref 70–99)
Potassium: 4.6 mmol/L (ref 3.5–5.1)
Sodium: 132 mmol/L — ABNORMAL LOW (ref 135–145)

## 2019-08-11 LAB — CBC WITH DIFFERENTIAL/PLATELET
Abs Immature Granulocytes: 0.99 10*3/uL — ABNORMAL HIGH (ref 0.00–0.07)
Basophils Absolute: 0.1 10*3/uL (ref 0.0–0.1)
Basophils Relative: 1 %
Eosinophils Absolute: 0.2 10*3/uL (ref 0.0–0.5)
Eosinophils Relative: 2 %
HCT: 28.8 % — ABNORMAL LOW (ref 36.0–46.0)
Hemoglobin: 8.9 g/dL — ABNORMAL LOW (ref 12.0–15.0)
Immature Granulocytes: 7 %
Lymphocytes Relative: 17 %
Lymphs Abs: 2.5 10*3/uL (ref 0.7–4.0)
MCH: 28.8 pg (ref 26.0–34.0)
MCHC: 30.9 g/dL (ref 30.0–36.0)
MCV: 93.2 fL (ref 80.0–100.0)
Monocytes Absolute: 1.1 10*3/uL — ABNORMAL HIGH (ref 0.1–1.0)
Monocytes Relative: 8 %
Neutro Abs: 9.3 10*3/uL — ABNORMAL HIGH (ref 1.7–7.7)
Neutrophils Relative %: 65 %
Platelets: 811 10*3/uL — ABNORMAL HIGH (ref 150–400)
RBC: 3.09 MIL/uL — ABNORMAL LOW (ref 3.87–5.11)
RDW: 15.8 % — ABNORMAL HIGH (ref 11.5–15.5)
WBC: 14.2 10*3/uL — ABNORMAL HIGH (ref 4.0–10.5)
nRBC: 0.3 % — ABNORMAL HIGH (ref 0.0–0.2)

## 2019-08-11 LAB — GLUCOSE, CAPILLARY
Glucose-Capillary: 112 mg/dL — ABNORMAL HIGH (ref 70–99)
Glucose-Capillary: 136 mg/dL — ABNORMAL HIGH (ref 70–99)
Glucose-Capillary: 125 mg/dL — ABNORMAL HIGH (ref 70–99)

## 2019-08-11 LAB — PHOSPHORUS: Phosphorus: 3.3 mg/dL (ref 2.5–4.6)

## 2019-08-11 MED ORDER — FAT EMULSION PLANT BASED 20 % IV EMUL
360.0000 mL | INTRAVENOUS | Status: AC
  Administered 2019-08-11: 360 mL via INTRAVENOUS
  Filled 2019-08-11: qty 360

## 2019-08-11 MED ORDER — TRACE MINERALS CU-MN-SE-ZN 300-55-60-3000 MCG/ML IV SOLN
INTRAVENOUS | Status: AC
  Filled 2019-08-11: qty 2000

## 2019-08-11 MED ORDER — CLINIMIX E/DEXTROSE (5/20) 5 % IV SOLN
INTRAVENOUS | Status: AC
  Filled 2019-08-11: qty 1000

## 2019-08-11 NOTE — Progress Notes (Signed)
PHARMACY - TOTAL PARENTERAL NUTRITION CONSULT NOTE   Indication:  s/p laparoscopic converted to open total hysterectomy, left salpingectomy, cystoscopy and small bowel resection (near distal ileum) secondary to bowel injury 3/8. Pt declined post operatively requiring another exploratory laparotomy, lysis of adhesions, abdominal wound washout, resection of previous anastomosis with a portion small bowel removed and subsequent reanastomosis 3/14, now  second anastamosis failure and revision 3/23  Patient Measurements: Height: 5\' 2"  (157.5 cm) Weight: 249 lb 1.9 oz (113 kg) IBW/kg (Calculated) : 50.1 TPN AdjBW (KG): 70.9 Body mass index is 45.56 kg/m.  Assessment: 44 y.o. female  s/p exploratory laparotomy and repair of small bowel enterotomy as well as hysterectomy with OB/GYN  Post-Op re-exploration abdominal washout and resection of original anastomosis x 2, hospital day #17  Glucose / Insulin: BG 118 - 139, continue SSI q6h, 2 units/ 24 hrs  Electrolytes: WNL Renal: Scr <1, stable LFTs / TGs: TG 198-->166 Prealbumin / albumin: 13.6/2.6 Intake / Output: 3495/2120  Central access:  07/30/19 TPN start date: 07/30/19  Nutritional Goals per RD recommendation revised 3/24 Kcal:  2832kcal/day Protein:  120g/day Goal TPN rate is 100 mL/hr (Regimen at goal rate will provide 2832kcal/day, 100g/day protein, 2760 ml volume )  Fluid: none  ABX: Eraxis 3/19>> Zosyn 3/12 >> 3/24 Meropenem 3/24 >>  Current Nutrition:  NPO  Plan:   continue Clinimix 5/20 with electrolytes at 100 ml/hr  Add MVI on Mondays and Thursdays, trace elements daily  Continue 20% lipids @30ml /hr x 12 hrs/day   Continue sensitive SSI q6h and adjust as needed   Monitor TPN labs on Mon/Thurs, per protocol and electrolytes daily for now  Oswald Hillock, PharmD, BCPS Clinical Pharmacist 08/11/2019 10:52 AM

## 2019-08-11 NOTE — Progress Notes (Signed)
Dr. Delaine Lame contacted this nurse regarding foley catheter removal and requested for this nurse to obtain approval from Dr. Christian Mate to discontinue foley catheter and use purwik instead.  Dr. Christian Mate notified and was in agreement with foley catheter removal and placement of purwik.

## 2019-08-11 NOTE — Progress Notes (Signed)
Subjective:  CC: Karen Aguirre is a 44 y.o. female  Hospital stay day 36,  exploratory laparotomy and repair of small bowel enterotomy as well as hysterectomy with OB/GYN, repeat ex-lap and re-anastamosis for failed anastamosis  6 Days Post-Op s/o end ileostomy, malecot drainage/control of efferent limb. Reexploration abdominal washout, LOA, end ileostomy and drain placement.  HPI: No acute issues overnight.  Some improvement with nausea, reported with minimal p.o. intake.    Objective:   Temp:  [97.6 F (36.4 C)-98.4 F (36.9 C)] 97.6 F (36.4 C) (03/28 0438) Pulse Rate:  [89-98] 97 (03/28 0438) Resp:  [18-19] 18 (03/28 0438) BP: (138-195)/(62-96) 153/78 (03/28 0438) SpO2:  [97 %-98 %] 98 % (03/28 0438) Weight:  [id number] kg] 113 kg (03/28 0500)     Height: 5\' 2"  (157.5 cm) Weight: 113 kg BMI (Calculated): 45.55   Intake/Output this shift:   Intake/Output Summary (Last 24 hours) at 08/11/2019 0941 Last data filed at 08/11/2019 [id number] Gross per 24 hour  Intake 3156.69 ml  Output 840 ml  Net 2316.69 ml    Constitutional :  Much more alert today and responsive, encouraged her to sit in bed as tolerated today, she seemed responsive and intentional to cooperate as nurse present with further discussion.   Respiratory:  clear to auscultation bilaterally  Cardiovascular:  regular rate and rhythm  Gastrointestinal: soft, focal guarding around incision, ostomy still dark, sloughing dusky mucosa.  Thin dark serous output in bag.  malecot drain with minimal mucus output. IR drain with serosanguinous output.  staples intact, some serosanguinous drainge around retention sutures.  Skin: Cool and moist.   Psychiatric:  improved countenance.       LABS:  CMP Latest Ref Rng & Units 08/11/2019 08/10/2019 08/09/2019  Glucose 70 - 99 mg/dL 127(H) 172(H) 102(H)  BUN 6 - 20 mg/dL 11 9 9   Creatinine 0.44 - 1.00 mg/dL 0.38(L) 0.47 0.44  Sodium 135 - 145 mmol/L 132(L) 132(L) 136  Potassium 3.5 - 5.1  mmol/L 4.6 4.5 4.1  Chloride 98 - 111 mmol/L 98 99 104  CO2 22 - 32 mmol/L 25 24 25   Calcium 8.9 - 10.3 mg/dL 8.3(L) 8.2(L) 8.1(L)  Total Protein 6.5 - 8.1 g/dL - - -  Total Bilirubin 0.3 - 1.2 mg/dL - - -  Alkaline Phos 38 - 126 U/L - - -  AST 15 - 41 U/L - - -  ALT 0 - 44 U/L - - -   CBC Latest Ref Rng & Units 08/11/2019 08/10/2019 08/09/2019  WBC 4.0 - 10.5 K/uL 14.2(H) 15.3(H) 13.5(H)  Hemoglobin 12.0 - 15.0 g/dL 8.9(L) 8.4(L) 7.7(L)  Hematocrit 36.0 - 46.0 % 28.8(L) 27.1(L) 25.0(L)  Platelets 150 - 400 K/uL 811(H) 885(H) 899(H)    RADS: n/a Assessment:   exploratory laparotomy and repair of small bowel enterotomy as well as hysterectomy with OB/GYN, repeat ex-lap and re-anastamosis for failed anastamosis. Reexploration for dehiscence, second anastamosis failure, abdominal washout, LOA, end ileostomy and drain placement.  Continue clears as tolerated  TPN continued until adequate return of bowel function is noted and ostomy continue to look better.    WBC trend halted, will monitor.   ID extended antibiotics coverage to [place].   All questions addressed, her nurse present.

## 2019-08-11 NOTE — Progress Notes (Signed)
   Date of Admission:  07/22/2019    ID:    Subjective: Feeling better Tolerating liquids Pain abdomen better NG out 08/09/19  Medications:  . sodium chloride   Intravenous Once  . acidophilus  1 capsule Oral Daily  . Chlorhexidine Gluconate Cloth  6 each Topical Daily  . cyanocobalamin  1,000 mcg Intramuscular Daily  . enoxaparin (LOVENOX) injection  40 mg Subcutaneous Q12H  . escitalopram  20 mg Oral Daily  . gabapentin  300 mg Oral QHS  . insulin aspart  0-9 Units Subcutaneous Q6H  . levothyroxine  62.5 mcg Intravenous Daily  . pantoprazole (PROTONIX) IV  40 mg Intravenous QHS  . scopolamine  1 patch Transdermal Q72H  . sodium chloride flush  10-40 mL Intracatheter Q12H    Objective: Vital signs in last 24 hours: Temp:  [97.6 F (36.4 C)-98.4 F (36.9 C)] 97.6 F (36.4 C) (03/28 0438) Pulse Rate:  [92-98] 97 (03/28 0438) Resp:  [18-19] 18 (03/28 0438) BP: (138-180)/(62-84) 153/78 (03/28 0438) SpO2:  [97 %-98 %] 98 % (03/28 0438) Weight:  IW:[id number] kg] 113 kg (03/28 0500)  PHYSICAL EXAM:  General: Alert, cooperative, no distress, appears stated age.  Head: Normocephalic, without obvious abnormality, atraumatic. Eyes: Conjunctivae clear, anicteric sclerae. Pupils are equal ENT Nares normal. No drainage or sinus tenderness. Lips, mucosa, and tongue normal. No Thrush Neck: Supple, symmetrical, no adenopathy, thyroid: non tender no carotid bruit and no JVD. Lungs: b/l air entry Heart: s1s2 Abdomen: Soft, ileostomy 2 drains Foley Extremities: atraumatic, no cyanosis. No edema. No clubbing Skin: No rashes or lesions. Or bruising Lymph: Cervical, supraclavicular normal. Neurologic: Grossly non-focal  Lab Results Recent Labs    08/10/19 0638 08/11/19 0504  WBC 15.3* 14.2*  HGB 8.4* 8.9*  HCT 27.1* 28.8*  NA 132* 132*  K 4.5 4.6  CL 99 98  CO2 24 25  BUN 9 11  CREATININE 0.47 0.38*   Liver Panel No results for input(s): PROT, ALBUMIN, AST, ALT, ALKPHOS,  BILITOT, BILIDIR, IBILI in the last 72 hours. Sedimentation Rate No results for input(s): ESRSEDRATE in the last 72 hours. C-Reactive Protein No results for input(s): CRP in the last 72 hours.  Microbiology: 3/22 culture- e.coli,    Impression/Recommendation Complicated surgical History with attempted robotic hysterectomy causing SB perforation leading to repair on 3/8 Followed by leak and exp lap on 3/14, followed by dehiscence and exp lap and ileostomy on 08/06/19  E.coli (MDR) and yeast in cultures on meropenem and anidulafungin Leucocytosis( plateud) and thrombocytosis improving Continue  current antibiotics Discussed with patient and husband

## 2019-08-11 NOTE — Plan of Care (Signed)
Continuing with plan of care. 

## 2019-08-12 LAB — COMPREHENSIVE METABOLIC PANEL
ALT: 40 U/L (ref 0–44)
AST: 33 U/L (ref 15–41)
Albumin: 2.5 g/dL — ABNORMAL LOW (ref 3.5–5.0)
Alkaline Phosphatase: 218 U/L — ABNORMAL HIGH (ref 38–126)
Anion gap: 10 (ref 5–15)
BUN: 12 mg/dL (ref 6–20)
CO2: 26 mmol/L (ref 22–32)
Calcium: 8.4 mg/dL — ABNORMAL LOW (ref 8.9–10.3)
Chloride: 95 mmol/L — ABNORMAL LOW (ref 98–111)
Creatinine, Ser: 0.53 mg/dL (ref 0.44–1.00)
GFR calc Af Amer: >60 mL/min (ref >60)
GFR calc non Af Amer: >60 mL/min (ref >60)
Glucose, Bld: 141 mg/dL — ABNORMAL HIGH (ref 70–99)
Potassium: 4.3 mmol/L (ref 3.5–5.1)
Sodium: 131 mmol/L — ABNORMAL LOW (ref 135–145)
Total Bilirubin: 0.3 mg/dL (ref 0.3–1.2)
Total Protein: 8.3 g/dL — ABNORMAL HIGH (ref 6.5–8.1)

## 2019-08-12 LAB — DIFFERENTIAL
Abs Immature Granulocytes: 0.87 10*3/uL — ABNORMAL HIGH (ref 0.00–0.07)
Basophils Absolute: 0.1 10*3/uL (ref 0.0–0.1)
Basophils Relative: 1 %
Eosinophils Absolute: 0.3 10*3/uL (ref 0.0–0.5)
Eosinophils Relative: 2 %
Immature Granulocytes: 7 %
Lymphocytes Relative: 20 %
Lymphs Abs: 2.5 10*3/uL (ref 0.7–4.0)
Monocytes Absolute: 1 10*3/uL (ref 0.1–1.0)
Monocytes Relative: 8 %
Neutro Abs: 7.6 10*3/uL (ref 1.7–7.7)
Neutrophils Relative %: 62 %

## 2019-08-12 LAB — CBC
HCT: 29.5 % — ABNORMAL LOW (ref 36.0–46.0)
Hemoglobin: 9.1 g/dL — ABNORMAL LOW (ref 12.0–15.0)
MCH: 29.2 pg (ref 26.0–34.0)
MCHC: 30.8 g/dL (ref 30.0–36.0)
MCV: 94.6 fL (ref 80.0–100.0)
Platelets: 736 10*3/uL — ABNORMAL HIGH (ref 150–400)
RBC: 3.12 MIL/uL — ABNORMAL LOW (ref 3.87–5.11)
RDW: 15.8 % — ABNORMAL HIGH (ref 11.5–15.5)
WBC: 12.4 10*3/uL — ABNORMAL HIGH (ref 4.0–10.5)
nRBC: 0.2 % (ref 0.0–0.2)

## 2019-08-12 LAB — GLUCOSE, CAPILLARY
Glucose-Capillary: 103 mg/dL — ABNORMAL HIGH (ref 70–99)
Glucose-Capillary: 122 mg/dL — ABNORMAL HIGH (ref 70–99)
Glucose-Capillary: 124 mg/dL — ABNORMAL HIGH (ref 70–99)
Glucose-Capillary: 137 mg/dL — ABNORMAL HIGH (ref 70–99)
Glucose-Capillary: 144 mg/dL — ABNORMAL HIGH (ref 70–99)

## 2019-08-12 LAB — RETIC PANEL
Immature Retic Fract: 33.3 % — ABNORMAL HIGH (ref 2.3–15.9)
RBC.: 3.14 MIL/uL — ABNORMAL LOW (ref 3.87–5.11)
Retic Count, Absolute: 219.2 10*3/uL — ABNORMAL HIGH (ref 19.0–186.0)
Retic Ct Pct: 7 % — ABNORMAL HIGH (ref 0.4–3.1)
Reticulocyte Hemoglobin: 30.6 pg (ref >27.9)

## 2019-08-12 LAB — TRIGLYCERIDES: Triglycerides: 390 mg/dL — ABNORMAL HIGH (ref <150)

## 2019-08-12 LAB — PREALBUMIN: Prealbumin: 24.7 mg/dL (ref 18–38)

## 2019-08-12 LAB — MAGNESIUM: Magnesium: 2.3 mg/dL (ref 1.7–2.4)

## 2019-08-12 LAB — PHOSPHORUS: Phosphorus: 3.8 mg/dL (ref 2.5–4.6)

## 2019-08-12 MED ORDER — LEVOTHYROXINE SODIUM 50 MCG PO TABS: 125.0000 ug | ORAL_TABLET | Freq: Every day | ORAL | Status: DC

## 2019-08-12 MED ORDER — ENSURE ENLIVE PO LIQD
237.0000 mL | Freq: Three times a day (TID) | ORAL | Status: DC
  Administered 2019-08-15: 237 mL via ORAL

## 2019-08-12 MED ORDER — METOCLOPRAMIDE HCL 5 MG PO TABS: 5.0000 mg | ORAL_TABLET | Freq: Three times a day (TID) | ORAL | Status: DC | PRN

## 2019-08-12 MED ORDER — TRAMADOL HCL 50 MG PO TABS: 50.0000 mg | ORAL_TABLET | Freq: Four times a day (QID) | ORAL | Status: DC | PRN

## 2019-08-12 MED ORDER — TRACE MINERALS CU-MN-SE-ZN 300-55-60-3000 MCG/ML IV SOLN
40.0000 mL/h | INTRAVENOUS | Status: AC
  Filled 2019-08-12: qty 960

## 2019-08-12 MED ORDER — HYDROCODONE-ACETAMINOPHEN 5-325 MG PO TABS
1.0000 | ORAL_TABLET | Freq: Four times a day (QID) | ORAL | Status: DC | PRN
  Filled 2019-08-12: qty 1

## 2019-08-12 MED ORDER — MORPHINE SULFATE (PF) 2 MG/ML IV SOLN
1.0000 mg | INTRAVENOUS | Status: DC | PRN
  Administered 2019-08-12 – 2019-08-18 (×20): 1 mg via INTRAVENOUS
  Filled 2019-08-12 (×21): qty 1

## 2019-08-12 MED ORDER — PROMETHAZINE HCL 25 MG PO TABS: 25.0000 mg | ORAL_TABLET | Freq: Four times a day (QID) | ORAL | Status: DC | PRN

## 2019-08-12 MED ORDER — LEVOTHYROXINE SODIUM 100 MCG/5ML IV SOLN
62.5000 ug | Freq: Every day | INTRAVENOUS | Status: DC
  Administered 2019-08-12 – 2019-08-18 (×7): 62.5 ug via INTRAVENOUS
  Filled 2019-08-12 (×8): qty 5

## 2019-08-12 MED ORDER — ONDANSETRON 4 MG PO TBDP
4.0000 mg | ORAL_TABLET | Freq: Three times a day (TID) | ORAL | Status: DC | PRN
  Administered 2019-08-12 (×2): 4 mg via ORAL
  Filled 2019-08-12 (×2): qty 1

## 2019-08-12 MED ORDER — CYANOCOBALAMIN 1000 MCG/ML IJ SOLN
1000.0000 ug | INTRAMUSCULAR | Status: DC
  Administered 2019-08-15: 1000 ug via INTRAMUSCULAR
  Filled 2019-08-12: qty 1

## 2019-08-12 MED ORDER — DIPHENHYDRAMINE HCL 12.5 MG/5ML PO ELIX
12.5000 mg | ORAL_SOLUTION | Freq: Four times a day (QID) | ORAL | Status: DC | PRN
  Filled 2019-08-12: qty 5

## 2019-08-12 MED ORDER — SODIUM CHLORIDE 0.9 % IV SOLN
1.0000 mg | Freq: Every day | INTRAVENOUS | Status: AC
  Administered 2019-08-12 – 2019-08-14 (×3): 1 mg via INTRAVENOUS
  Filled 2019-08-12 (×3): qty 0.2

## 2019-08-12 MED ORDER — ONDANSETRON HCL 4 MG PO TABS: 4.0000 mg | ORAL_TABLET | ORAL | Status: DC | PRN

## 2019-08-12 MED ORDER — ACETAMINOPHEN 325 MG PO TABS: 650.0000 mg | ORAL_TABLET | Freq: Four times a day (QID) | ORAL | Status: DC | PRN

## 2019-08-12 MED ORDER — PROMETHAZINE HCL 25 MG/ML IJ SOLN
12.5000 mg | Freq: Four times a day (QID) | INTRAMUSCULAR | Status: DC | PRN
  Administered 2019-08-12 – 2019-08-19 (×17): 12.5 mg via INTRAVENOUS
  Filled 2019-08-12 (×19): qty 1

## 2019-08-12 MED ORDER — VITAMIN B-12 1000 MCG PO TABS
1000.0000 ug | ORAL_TABLET | Freq: Every day | ORAL | Status: AC
  Filled 2019-08-12: qty 1

## 2019-08-12 MED ORDER — FAT EMULSION PLANT BASED 20 % IV EMUL
250.0000 mL | INTRAVENOUS | Status: AC
  Administered 2019-08-12: 250 mL via INTRAVENOUS
  Filled 2019-08-12: qty 250

## 2019-08-12 NOTE — Progress Notes (Signed)
PHARMACIST - PHYSICIAN COMMUNICATION  DR:   Lysle Pearl  CONCERNING: IV to Oral Route Change Policy  RECOMMENDATION: This patient is receiving diphenhydramine, metoclopramide, promethazine, and ondansetron by the intravenous route.  Based on criteria approved by the Pharmacy and Therapeutics Committee, the intravenous medication(s) is/are being converted to the equivalent oral dose form(s).   DESCRIPTION: These criteria include:  The patient is eating (either orally or via tube) and/or has been taking other orally administered medications for a least 24 hours  The patient has no evidence of active gastrointestinal bleeding or impaired GI absorption (gastrectomy, short bowel, patient on TNA or NPO).  If you have questions about this conversion, please contact the Pharmacy Department  []   [phone] )  [facility] [x]   [phone] )  [facility] []   [phone] )  [facility] []   [phone] )  [facility] []   [phone] )  [place], PharmD, BCPS Clinical Pharmacist 08/12/2019 10:48 AM

## 2019-08-12 NOTE — Care Management (Signed)
Attempted to meet with patient at bedside to discuss discharged planning.  Bedside nurse performing patient care.  Will attempt at a later time

## 2019-08-12 NOTE — Progress Notes (Signed)
PHARMACY - TOTAL PARENTERAL NUTRITION CONSULT NOTE   Indication:  s/p laparoscopic converted to open total hysterectomy, left salpingectomy, cystoscopy and small bowel resection (near distal ileum) secondary to bowel injury 3/8. Pt declined post operatively requiring another exploratory laparotomy, lysis of adhesions, abdominal wound washout, resection of previous anastomosis with a portion small bowel removed and subsequent reanastomosis 3/14, now  second anastamosis failure and revision 3/23  Patient Measurements: Height: 5\' 2"  (157.5 cm) Weight: 249 lb 1.9 oz (113 kg) IBW/kg (Calculated) : 50.1 TPN AdjBW (KG): 70.9 Body mass index is 45.56 kg/m.  Assessment: 44 y.o. female  s/p exploratory laparotomy and repair of small bowel enterotomy as well as hysterectomy with OB/GYN  Post-Op re-exploration abdominal washout and resection of original anastomosis x 2, hospital day #18  Glucose / Insulin: BG 124 - 144, continue SSI q6h, 2 units/ 24 hrs  Electrolytes: WNL Renal: Scr <1, stable LFTs / TGs: TG 198-->166 - - -> 390 Prealbumin / albumin: 13.6/2.6 Intake / Output: 3495/2120  Central access:  07/30/19 TPN start date: 07/30/19  Nutritional Goals per RD recommendation revised 3/29 Kcal:  2832kcal/day Protein:  120g/day Goal TPN rate is 100 mL/hr (Regimen at goal rate will provide 2832kcal/day, 100g/day protein, 2760 ml volume )  Per discussion with dietician/Dr Lysle Pearl, will be reducing TPN rate per increased oral intake  IV Fluid: none  ABX:  Eraxis 3/19>> Zosyn 3/12 >> 3/24 Meropenem 3/24 >>  Current Nutrition:  Pt tolerating full liquids ~ 24 hours  Plan:   Reduce Clinimix 5/20 with electrolytes to 40 ml/hr  Continue MVI on Mondays and Thursdays, trace elements daily  Reduce 20% lipids to 63ml/hr x 12 hrs/day (per overall TPN reduction and elevated TG's  Continue sensitive SSI q6h and adjust as needed   Monitor TPN labs on Mon/Thurs, per protocol  No longer  following electrolytes daily per labs stable x multiple days   Lu Duffel, PharmD, BCPS Clinical Pharmacist 08/12/2019 10:13 AM

## 2019-08-12 NOTE — Progress Notes (Signed)
OT Cancellation Note  Patient Details Name: Karen Aguirre MRN: [id number] DOB: 23-Feb-1976   Cancelled Treatment:    Reason Eval/Treat Not Completed: Fatigue/lethargy limiting ability to participate;Patient declined, no reason specified. Consult received, chart reviewed. Pt sleeping soundly upon attempt. Wakes briefly but keeps eyes closed. Pt reporting significant fatigue and requesting rest at this time. Pt/spouse agreeable to OT re-attempting evaluation next date in the am.   Jeni Salles, MPH, MS, OTR/L ascom [phone] 08/12/19, 3:30 PM

## 2019-08-12 NOTE — Progress Notes (Signed)
Subjective:  CC: Karen Aguirre is a 44 y.o. female  Hospital stay day 21,  exploratory laparotomy and repair of small bowel enterotomy as well as hysterectomy with OB/GYN, repeat ex-lap and re-anastamosis for failed anastamosis  7 Days Post-Op  Reexploration abdominal washout, LOA, end ileostomy and drain placement.  HPI: No acute issues.  Tolerating full liquid.  Some nausea with pain med adminstration.  Drainage from wound thinks is decreasing.  ROS:  General: Denies weight loss, weight gain, fatigue, fevers, chills, and night sweats. Heart: Denies chest pain, palpitations, racing heart, irregular heartbeat, leg pain or swelling, and decreased activity tolerance. Respiratory: Denies breathing difficulty, shortness of breath, wheezing, cough, and sputum. GI: Denies change in appetite, heartburn, constipation, diarrhea, and blood in stool. GU: Denies difficulty urinating, pain with urinating, urgency, frequency, blood in urine.   Objective:   Temp:  [97.9 F (36.6 C)-98.6 F (37 C)] 98.4 F (36.9 C) (03/29 0506) Pulse Rate:  [93-105] 105 (03/29 0506) Resp:  [16-18] 18 (03/29 0506) BP: (130-156)/(74-84) 156/74 (03/29 0506) SpO2:  [97 %-98 %] 97 % (03/29 0506)     Height: 5\' 2"  (157.5 cm) Weight: 113 kg BMI (Calculated): 45.55   Intake/Output this shift:   Intake/Output Summary (Last 24 hours) at 08/12/2019 0719 Last data filed at 08/12/2019 0537 Gross per 24 hour  Intake 2595.11 ml  Output 4295 ml  Net -1699.89 ml    Constitutional :  alert, cooperative, appears stated age and mild distress  Respiratory:  clear to auscultation bilaterally  Cardiovascular:  regular rate and rhythm  Gastrointestinal: soft, focal guarding around incision, ostomy increased patches of pink.  More bilious output in bag.  malecot drain with sersanguinous discharge, minimal.  IR drain with thicker output, not bilious, but not completely serous, minimal drainage.  staples intact, serosanguinous drainge  around retention sutures, with pooling at inferior aspect  Skin: Cool and moist.   Psychiatric: Normal affect, non-agitated, not confused       LABS:  CMP Latest Ref Rng & Units 08/12/2019 08/11/2019 08/10/2019  Glucose 70 - 99 mg/dL 141(H) 127(H) 172(H)  BUN 6 - 20 mg/dL 12 11 9   Creatinine 0.44 - 1.00 mg/dL 0.53 0.38(L) 0.47  Sodium 135 - 145 mmol/L 131(L) 132(L) 132(L)  Potassium 3.5 - 5.1 mmol/L 4.3 4.6 4.5  Chloride 98 - 111 mmol/L 95(L) 98 99  CO2 22 - 32 mmol/L 26 25 24   Calcium 8.9 - 10.3 mg/dL 8.4(L) 8.3(L) 8.2(L)  Total Protein 6.5 - 8.1 g/dL 8.3(H) - -  Total Bilirubin 0.3 - 1.2 mg/dL 0.3 - -  Alkaline Phos 38 - 126 U/L 218(H) - -  AST 15 - 41 U/L 33 - -  ALT 0 - 44 U/L 40 - -   CBC Latest Ref Rng & Units 08/12/2019 08/11/2019 08/10/2019  WBC 4.0 - 10.5 K/uL 12.4(H) 14.2(H) 15.3(H)  Hemoglobin 12.0 - 15.0 g/dL 9.1(L) 8.9(L) 8.4(L)  Hematocrit 36.0 - 46.0 % 29.5(L) 28.8(L) 27.1(L)  Platelets 150 - 400 K/uL 736(H) 811(H) 885(H)    RADS: n/a Assessment:   exploratory laparotomy and repair of small bowel enterotomy as well as hysterectomy with OB/GYN, repeat ex-lap and re-anastamosis for failed anastamosis. Reexploration for dehiscence, second anastamosis failure, abdominal washout, LOA, end ileostomy and drain placement.  Tolerating fulls with bilious ostomy output.  All labs downtrending, pain controlled.  Will advance to regular diet and continue to monitor inhouse until labs normalize.  Slightly tach again this am, along with some thicker, although  minimal, IR drain output.  Will continue to monitor closely.  Transition to oral pain meds to minimize nausea.  PT, ostomy consult in preparation for discharge, hopefully in next couple days

## 2019-08-12 NOTE — Progress Notes (Signed)
Hematology/Oncology Progress Note [facility] [phone] [phone]  Patient Care Team: Hortencia Pilar, MD as PCP - General (Family Medicine)   Name of the patient: Karen Aguirre  HO:[id number]  10/11/1975  Date of visit: 08/12/19   INTERVAL HISTORY-  Patient reports feeling well.  NG tube are discontinued.  Diet has been advanced.  She reports some nausea after taking some medication this morning.  Review of systems- Review of Systems  Constitutional: Positive for fatigue.  Respiratory: Negative for cough and shortness of breath.   Gastrointestinal: Negative for abdominal pain.       Abdomen surgery sites pain  Genitourinary: Negative for difficulty urinating.   Neurological: Negative for headaches.  Hematological: Does not bruise/bleed easily.  Psychiatric/Behavioral: Negative for confusion.    Allergies  Allergen Reactions  . Cobalt Hives    Patient Active Problem List   Diagnosis Date Noted  . Vitamin B12 deficiency   . Folate deficiency   . Abdominal wall abscess   . Thrombocytosis ([place])   . Absolute anemia   . Intra-abdominal fluid collection   . Fibroid uterus 07/22/2019  . Pelvic pain 07/22/2019  . S/P laparotomy 07/22/2019     Past Medical History:  Diagnosis Date  . Anxiety   . Hypothyroidism   . Thyroid disease      Past Surgical History:  Procedure Laterality Date  . BOWEL RESECTION N/A 07/22/2019   Procedure: SMALL BOWEL RESECTION;  Surgeon: Ward, Honor Loh, MD;  Location: [facility];  Service: Gynecology;  Laterality: N/A;  . BOWEL RESECTION  07/27/2019   Procedure: SMALL BOWEL RESECTION;  Surgeon: Ward, Honor Loh, MD;  Location: [facility];  Service: Gynecology;;  . CHOLECYSTECTOMY    . ECTOPIC PREGNANCY SURGERY    . HYSTERECTOMY ABDOMINAL WITH SALPINGECTOMY Bilateral 07/22/2019   Procedure: HYSTERECTOMY ABDOMINAL WITH lbilateral SALPINGECTOMY, removal of left anterior cul de sac nodule, cystoscopy;   Surgeon: Ward, Honor Loh, MD;  Location: [facility];  Service: Gynecology;  Laterality: Bilateral;  . ILEOSTOMY N/A 08/05/2019   Procedure: ILEOSTOMY;  Surgeon: Benjamine Sprague, DO;  Location: [facility];  Service: General;  Laterality: N/A;  . LAPAROTOMY N/A 07/22/2019   Procedure: LAPAROTOMY;  Surgeon: Ward, Honor Loh, MD;  Location: [facility];  Service: Gynecology;  Laterality: N/A;  . LAPAROTOMY N/A 07/27/2019   Procedure: EXPLORATORY LAPAROTOMY;  Surgeon: Ward, Honor Loh, MD;  Location: [facility];  Service: Gynecology;  Laterality: N/A;  . LAPAROTOMY N/A 08/05/2019   Procedure: EXPLORATORY LAPAROTOMY;  Surgeon: Benjamine Sprague, DO;  Location: [facility];  Service: General;  Laterality: N/A;  . ROBOTIC ASSISTED TOTAL HYSTERECTOMY WITH BILATERAL SALPINGO OOPHERECTOMY Bilateral 07/22/2019   Procedure: XI ROBOTIC ASSISTED TOTAL HYSTERECTOMY WITH BILATERAL SALPINGECTOMY _ATTEMPTED;  Surgeon: Ward, Honor Loh, MD;  Location: [facility];  Service: Gynecology;  Laterality: Bilateral;    Social History   Socioeconomic History  . Marital status: Married    Spouse name: Not on file  . Number of children: Not on file  . Years of education: Not on file  . Highest education level: Not on file  Occupational History  . Not on file  Tobacco Use  . Smoking status: Former Research scientist (life sciences)  . Smokeless tobacco: Never Used  . Tobacco comment: 20 years ago  Substance and Sexual Activity  . Alcohol use: Yes    Comment: ocassional  . Drug use: No  . Sexual activity: Not on file  Other Topics Concern  . Not on file  Social History  Narrative  . Not on file   Social Determinants of Health   Financial Resource Strain:   . Difficulty of Paying Living Expenses:   Food Insecurity:   . Worried About Charity fundraiser in the Last Year:   . Arboriculturist in the Last Year:   Transportation Needs:   . Film/video editor (Medical):   Marland Kitchen Lack of Transportation (Non-Medical):   Physical Activity:   . Days of Exercise per Week:    . Minutes of Exercise per Session:   Stress:   . Feeling of Stress :   Social Connections:   . Frequency of Communication with Friends and Family:   . Frequency of Social Gatherings with Friends and Family:   . Attends Religious Services:   . Active Member of Clubs or Organizations:   . Attends Archivist Meetings:   Marland Kitchen Marital Status:   Intimate Partner Violence:   . Fear of Current or Ex-Partner:   . Emotionally Abused:   Marland Kitchen Physically Abused:   . Sexually Abused:      History reviewed. No pertinent family history.   Current Facility-Administered Medications:  .  Marland KitchenTPN (CLINIMIX-E) Adult, , Intravenous, Continuous TPN, Last Rate: 100 mL/hr at 08/12/19 0305, Rate Verify at 08/12/19 0305 **FOLLOWED BY** .TPN (CLINIMIX-E) Adult, , Intravenous, Continuous TPN, Oswald Hillock, RPH .  Marland KitchenTPN (CLINIMIX-E) Adult, 40 mL/hr, Intravenous, Continuous TPN, Shanlever, Pierce Crane, RPH .  0.9 %  sodium chloride infusion (Manually program via Guardrails IV Fluids), , Intravenous, Once, Sakai, Isami, DO .  0.9 %  sodium chloride infusion, , Intravenous, PRN, Lysle Pearl, Isami, DO, Stopped at 08/08/19 1425 .  acidophilus (RISAQUAD) capsule 1 capsule, 1 capsule, Oral, Daily, Sakai, Isami, DO, 1 capsule at 08/08/19 0946 .  anidulafungin (ERAXIS) 100 mg in sodium chloride 0.9 % 100 mL IVPB, 100 mg, Intravenous, Q24H, Sakai, Isami, DO, Stopped at 08/11/19 1639 .  Chlorhexidine Gluconate Cloth 2 % PADS 6 each, 6 each, Topical, Daily, Sakai, Isami, DO, 6 each at 08/11/19 1000 .  [START ON 08/15/2019] cyanocobalamin ((VITAMIN B-12)) injection 1,000 mcg, 1,000 mcg, Intramuscular, Weekly, Earlie Server, MD .  diphenhydrAMINE (BENADRYL) 12.5 MG/5ML elixir 12.5 mg, 12.5 mg, Oral, Q6H PRN, Shanlever, Pierce Crane, RPH .  enoxaparin (LOVENOX) injection 40 mg, 40 mg, Subcutaneous, Q12H, Sakai, Isami, DO, 40 mg at 08/12/19 1121 .  escitalopram (LEXAPRO) tablet 20 mg, 20 mg, Oral, Daily, Sakai, Isami, DO, Stopped at 08/12/19  1120 .  Fat emulsion 20 % infusion 250 mL, 250 mL, Intravenous, Continuous TPN, Shanlever, Pierce Crane, RPH .  feeding supplement (ENSURE ENLIVE) (ENSURE ENLIVE) liquid 237 mL, 237 mL, Oral, TID BM, Sakai, Isami, DO .  folic acid 1 mg in sodium chloride 0.9 % 50 mL IVPB, 1 mg, Intravenous, Daily, Earlie Server, MD .  insulin aspart (novoLOG) injection 0-9 Units, 0-9 Units, Subcutaneous, Q6H, Sakai, Isami, DO, 1 Units at 08/11/19 1814 .  ipratropium-albuterol (DUONEB) 0.5-2.5 (3) MG/3ML nebulizer solution 3 mL, 3 mL, Nebulization, Q4H PRN, Sakai, Isami, DO, 3 mL at 07/28/19 1205 .  levothyroxine (SYNTHROID, LEVOTHROID) injection 62.5 mcg, 62.5 mcg, Intravenous, Daily, Sakai, Isami, DO .  LORazepam (ATIVAN) injection 1 mg, 1 mg, Intravenous, QHS PRN,MR X 1, Sakai, Isami, DO, 1 mg at 08/11/19 2127 .  menthol-cetylpyridinium (CEPACOL) lozenge 3 mg, 1 lozenge, Oral, Q2H PRN, Sakai, Isami, DO .  meropenem (MERREM) 1 g in sodium chloride 0.9 % 100 mL IVPB, 1 g, Intravenous, Q8H, Adrian Prows  P, MD, Last Rate: 200 mL/hr at 08/12/19 0505, 1 g at 08/12/19 0505 .  morphine 2 MG/ML injection 1 mg, 1 mg, Intravenous, Q3H PRN, Sakai, Isami, DO .  ondansetron (ZOFRAN-ODT) disintegrating tablet 4 mg, 4 mg, Oral, Q8H PRN, Sakai, Isami, DO .  promethazine (PHENERGAN) injection 12.5 mg, 12.5 mg, Intravenous, Q6H PRN, Sakai, Isami, DO .  scopolamine (TRANSDERM-SCOP) 1 MG/3DAYS 1.5 mg, 1 patch, Transdermal, Q72H, Sakai, Isami, DO, 1.5 mg at 08/09/19 1805 .  sodium chloride (OCEAN) 0.65 % nasal spray 1 spray, 1 spray, Each Nare, PRN, Sakai, Isami, DO .  sodium chloride flush (NS) 0.9 % injection 10-40 mL, 10-40 mL, Intracatheter, Q12H, Sakai, Isami, DO, 10 mL at 08/11/19 2200 .  vitamin B-12 (CYANOCOBALAMIN) tablet 1,000 mcg, 1,000 mcg, Oral, Daily, Benjamine Sprague, DO   Physical exam:  Vitals:   08/11/19 1820 08/11/19 2032 08/12/19 0506 08/12/19 1200  BP: 130/74 (!) 152/84 (!) 156/74 132/78  Pulse: 97 93 (!) 105 (!)  108  Resp: 16 18 18 20   Temp: 98.6 F (37 C) 97.9 F (36.6 C) 98.4 F (36.9 C) 98.3 F (36.8 C)  TempSrc:  Oral Oral Oral  SpO2: 97% 98% 97% 97%  Weight:      Height:       Physical Exam  Constitutional: She is oriented to person, place, and time. No distress.  Eyes: No scleral icterus.  Cardiovascular: Normal rate.  Pulmonary/Chest: Effort normal.  Abdominal: Bowel sounds are normal.  Musculoskeletal:     Cervical back: Neck supple.  Neurological: She is alert and oriented to person, place, and time.  Skin: Skin is warm.  Psychiatric: Affect normal.       CMP Latest Ref Rng & Units 08/12/2019  Glucose 70 - 99 mg/dL 141(H)  BUN 6 - 20 mg/dL 12  Creatinine 0.44 - 1.00 mg/dL 0.53  Sodium 135 - 145 mmol/L 131(L)  Potassium 3.5 - 5.1 mmol/L 4.3  Chloride 98 - 111 mmol/L 95(L)  CO2 22 - 32 mmol/L 26  Calcium 8.9 - 10.3 mg/dL 8.4(L)  Total Protein 6.5 - 8.1 g/dL 8.3(H)  Total Bilirubin 0.3 - 1.2 mg/dL 0.3  Alkaline Phos 38 - 126 U/L 218(H)  AST 15 - 41 U/L 33  ALT 0 - 44 U/L 40   CBC Latest Ref Rng & Units 08/12/2019  WBC 4.0 - 10.5 K/uL 12.4(H)  Hemoglobin 12.0 - 15.0 g/dL 9.1(L)  Hematocrit 36.0 - 46.0 % 29.5(L)  Platelets 150 - 400 K/uL 736(H)    RADIOGRAPHIC STUDIES: I have personally reviewed the radiological images as listed and agreed with the findings in the report. DG Chest 1 View  Result Date: 07/28/2019 CLINICAL DATA:  Tachypnea, recent hysterectomy EXAM: CHEST  1 VIEW COMPARISON:  07/27/2019 chest radiograph. FINDINGS: Low lung volumes. Stable cardiomediastinal silhouette with normal heart size. No pneumothorax. No pleural effusion. Mild right basilar atelectasis, unchanged. No pulmonary edema. No acute consolidative airspace disease. IMPRESSION: Stable low lung volumes with mild right basilar atelectasis. Electronically Signed   By: Ilona Sorrel M.D.   On: 07/28/2019 16:45   DG Chest 1 View  Result Date: 07/27/2019 CLINICAL DATA:  Tachypnea.  Recent  abdominal surgery. EXAM: CHEST  1 VIEW COMPARISON:  11/03/2010 FINDINGS: Lung volumes are low. There is opacity at the right lung base consistent with atelectasis. Remainder of the lungs is clear. No convincing pleural effusion and no pneumothorax. Cardiac silhouette is normal in size. No mediastinal or hilar masses. Skeletal structures are grossly intact.  IMPRESSION: 1. No acute cardiopulmonary disease. 2. Mild right lung base atelectasis. Electronically Signed   By: Lajean Manes M.D.   On: 07/27/2019 16:15   DG Abd 1 View  Result Date: 07/22/2019 CLINICAL DATA:  Evaluate for possible retained sponge EXAM: ABDOMEN - 1 VIEW COMPARISON:  None. FINDINGS: Scattered large and small bowel gas is noted. No abnormal mass or abnormal calcifications are seen. Postsurgical changes are noted. No radiopaque foreign body is identified to suggest retained sponge. IMPRESSION: No evidence of retained foreign body. These results will be called to the ordering clinician or representative by the Radiologist Assistant, and communication documented in the PACS or zVision Dashboard. Electronically Signed   By: Inez Catalina M.D.   On: 07/22/2019 22:49   [place] Abscess Drain  Result Date: 08/02/2019 INDICATION: 44 year old with recent hysterectomy and bowel surgery. Patient has elevated white blood cell count and small intra-abdominal fluid collections. EXAM: ULTRASOUND-GUIDED ASPIRATION OF ABDOMINAL FLUID MEDICATIONS: Fentanyl 25 mcg ANESTHESIA/SEDATION: The patient was continuously monitored during the procedure by the interventional radiology nurse under my direct supervision. COMPLICATIONS: None immediate. PROCEDURE: Informed written consent was obtained from the patient after a thorough discussion of the procedural risks, benefits and alternatives. All questions were addressed. A timeout was performed prior to the initiation of the procedure. Abdomen was evaluated with ultrasound. Pocket of fluid in the anterior right lower  abdomen was targeted just medial to the existing percutaneous drain. Skin was prepped with chlorhexidine and sterile field was created. Skin and soft tissues were anesthetized with 1% lidocaine. Using ultrasound guidance, a Yueh catheter was directed into this peritoneal fluid. Only 3 mL of fluid could be obtained. Unable to aspirate majority of the fluid in this area. Bandage placed over the puncture site. FINDINGS: Small pocket of fluid along the right anterior lower abdomen just medial to the existing percutaneous drain. Needle position confirmed within this small collection but only a small amount of slightly thick red fluid was removed. IMPRESSION: Ultrasound-guided aspiration of fluid from the right anterior abdomen. Only a small amount of slightly thick red fluid could be obtained. Findings could represent blood products or hematoma formation. Fluid was sent for culture. Electronically Signed   By: Markus Daft M.D.   On: 08/02/2019 17:11   [place] Abscess Drain  Result Date: 07/26/2019 INDICATION: History of hysterectomy complicated by small bowel injury requiring resection and primary anastomosis. CT scan of the abdomen and pelvis performed earlier today secondary to elevated white blood cell count and fever demonstrated indeterminate fluid collections within the abdomen with dominant collection within the right lower abdominal quadrant As such, request made for ultrasound-guided paracentesis versus drainage catheter placement for and diagnostic and potentially therapeutic purposes. EXAM: ULTRASOUND GUIDED ABSCESS DRAINAGE COMPARISON:  CT abdomen and pelvis - earlier same day MEDICATIONS: The patient is currently admitted to the hospital and receiving intravenous antibiotics. The antibiotics were administered within an appropriate time frame prior to the initiation of the procedure. ANESTHESIA/SEDATION: None CONTRAST:  None COMPLICATIONS: None immediate. PROCEDURE: Informed written consent was obtained from  the patient after a discussion of the risks, benefits and alternatives to treatment. The patient was placed supine on her hospital bed and sonographic evaluation was performed of the abdomen demonstrating ill-defined fluid scattered throughout the abdomen with dominant collection within right lower abdominal quadrant measuring approximately 9.6 x 8.2 x 3.9 cm, correlating with the dominant collection seen on preceding abdominal CT image 66, series 2. The procedure was planned. A timeout was performed prior  to the initiation of the procedure. The skin overlying the right lower abdomen was prepped and draped in the usual sterile fashion. The overlying soft tissues were anesthetized with 1% lidocaine with epinephrine. Appropriate trajectory was planned with the use of a 22 gauge spinal needle. An 18 gauge trocar needle was advanced into the abscess/fluid collection and a short Amplatz super stiff wire was coiled within the collection. Multiple ultrasound images were saved for procedural documentation purposes Next, the track was dilated ultimately allowing placement of a 10 [place] all-purpose drainage catheter. Next, approximately 150 cc of brown serous ascitic fluid was aspirated. A representative sample was capped and sent to the laboratory for analysis. The tube was connected to a drainage bag and sutured in place. A dressing was placed. The patient tolerated the procedure well without immediate post procedural complication. IMPRESSION: Successful ultrasound guided placement of a 10 [place] all purpose drain catheter into the dominant collection within the right lower abdomen with aspiration of 150 cc of brown, serous ascitic fluid. Samples were sent to the laboratory as requested by the ordering clinical team. Electronically Signed   By: Sandi Mariscal M.D.   On: 07/26/2019 17:30   CT ABDOMEN PELVIS W CONTRAST  Result Date: 07/31/2019 CLINICAL DATA:  44 yo F s/p exp. Lap and repair of small bowel enterotomy as well  as hysterectomy with OB/GYN and surgery involved. Reexploration abdominal washout and resection of original anastomosis today 3/14. EXAM: CT ABDOMEN AND PELVIS WITH CONTRAST TECHNIQUE: Multidetector CT imaging of the abdomen and pelvis was performed using the standard protocol following bolus administration of intravenous contrast. CONTRAST:  173mL OMNIPAQUE IOHEXOL 300 MG/ML  SOLN COMPARISON:  07/26/2019 FINDINGS: Lower chest: Dependent atelectasis, greater on the right, similar to the prior CT. No acute findings. Hepatobiliary: No focal liver abnormality is seen. Status post cholecystectomy. No biliary dilatation. Pancreas: Unremarkable. No pancreatic ductal dilatation or surrounding inflammatory changes. Spleen: Normal in size without focal abnormality. Adrenals/Urinary Tract: No adrenal masses. Stable low-density renal masses consistent with cysts on the left. Mild prominence of the right intrarenal collecting system. No renal or ureteral stones. No left renal collecting system dilation. Normal left ureter. Symmetric renal enhancement and excretion. Bladder minimally distended. Foley catheter in place. No bladder mass. Stomach/Bowel: Normal stomach. Mesenteric edema and inflammation most evident in the central to lower abdomen. Right lower quadrant percutaneously placed drainage catheter significantly decreases the size of the previously seen right lower quadrant collection. Residual collection measures 6.4 x 2.9 cm transversely. Small anterior less well-defined fluid collection is seen, to the right of midline, near the level of the umbilicus, 6.3 x 1.9 cm transversely. Fluid is noted within leaves of the small bowel mesentery most evident mid to lower abdomen, without additional defined collection. Bowel anastomosis staples are noted upper anterior pelvis extending to the right lower quadrant. No evidence of bowel obstruction. Several loops of central to lower abdomen small bowel demonstrate mild wall  thickening. Colon is mostly decompressed. No colonic wall thickening. Vascular/Lymphatic: No vascular abnormality. Several prominent mesenteric lymph nodes most evident right lower quadrant, all subcentimeter and similar to the prior CT. Reproductive: Status post hysterectomy. No adnexal masses. Other: Anterior abdominal wall air adjacent to the midline incision. No hernia. No abdominal wall collection to suggest abscess. Small amount of ascites. Tiny bubbles of free intraperitoneal air are noted deep to the midline incision. Musculoskeletal: No acute or significant abnormality. IMPRESSION: 1. No bowel obstruction. Mild small bowel wall thickening in the lower abdomen is  likely reactive/edema from the recent surgery. 2. Significant improvement the right lower quadrant collection following placement of a percutaneous drainage catheter. 3. No new abdominal collections. Small amount of ascites and diffuse mesenteric edema most evident mid to lower abdomen. 4. Few tiny bubbles of anterior midline free air, presumed postsurgical. Electronically Signed   By: Lajean Manes M.D.   On: 07/31/2019 10:48   CT ABDOMEN PELVIS W CONTRAST  Result Date: 07/26/2019 CLINICAL DATA:  Abdominal pain and tenderness. Leukocytosis. Recent small bowel resection and hysterectomy. EXAM: CT ABDOMEN AND PELVIS WITH CONTRAST TECHNIQUE: Multidetector CT imaging of the abdomen and pelvis was performed using the standard protocol following bolus administration of intravenous contrast. CONTRAST:  172mL OMNIPAQUE IOHEXOL 300 MG/ML  SOLN COMPARISON:  06/21/2019 FINDINGS: Lower Chest: New bibasilar atelectasis. Hepatobiliary: No hepatic masses identified. Prior cholecystectomy. No evidence of biliary obstruction. Pancreas:  No mass or inflammatory changes. Spleen: Within normal limits in size and appearance. Adrenals/Urinary Tract: Stable tiny bilateral renal cysts. No masses identified. No evidence of ureteral calculi or hydronephrosis.  Stomach/Bowel: A small amount of extraperitoneal air in the lower pelvis, and intraperitoneal air, are consistent with recent surgery. No evidence of bowel obstruction. Mildly dilated small bowel loops are seen containing air-fluid levels and increased gaseous distention of colon is also seen, suspicious for adynamic ileus. Diffuse mesenteric inflammatory changes or edema are new since previous study. Multiple small loculated fluid intraperitoneal fluid collections are seen in the abdomen and pelvis, 1 in the right lower quadrant measuring 9.8 x 5.1 cm on image 66/2. Vascular/Lymphatic: No pathologically enlarged lymph nodes. No abdominal aortic aneurysm. Reproductive: Prior hysterectomy noted. Rim enhancing fluid collection seen in the hysterectomy bed which measures 8.0 x 7.4 cm on image 67/7. Other:  None. Musculoskeletal:  No suspicious bone lesions identified. IMPRESSION: 1. Small amount of intraperitoneal and extraperitoneal air, consistent with recent surgery. 2. Multiple loculated intraperitoneal fluid collections in the abdomen and pelvis, largest in the right lower quadrant measuring 9.8 cm and pelvic cul-de-sac measuring 8.0 cm, suspicious for abscesses. 3. Mild diffuse dilatation of small bowel and colon, suspicious for adynamic ileus. 4. New bibasilar atelectasis. Electronically Signed   By: Marlaine Hind M.D.   On: 07/26/2019 13:38   US Abdomen Limited  Result Date: 07/30/2019 CLINICAL DATA:  Drainage from abdominal wall incision site for hysterectomy. Evaluate for abscess. EXAM: LIMITED ULTRASOUND OF ABDOMINAL SOFT TISSUES TECHNIQUE: Ultrasound examination of the anterior abdominal wall soft tissues was performed in the area of clinical concern at the previous incision site. COMPARISON:  None. FINDINGS: A small fluid collection is seen within the subcutaneous tissues of the midline abdominal wall just above the umbilicus which measures 0.5 x 0.5 x 1.1 cm. A 2nd small complex fluid collection is  seen just below the level of the umbilicus which measures 1.3 x 0.5 x 1.1 cm. IMPRESSION: Two small approximately 1 cm complex fluid collections are identified in the midline abdominal wall subcutaneous tissues. These may represent postop seromas or abscesses. Electronically Signed   By: Marlaine Hind M.D.   On: 07/30/2019 11:41   US Venous Img Lower Bilateral (DVT)  Result Date: 07/27/2019 CLINICAL DATA:  Tachycardia.  History of recent surgery. EXAM: BILATERAL LOWER EXTREMITY VENOUS DOPPLER ULTRASOUND TECHNIQUE: Gray-scale sonography with compression, as well as color and duplex ultrasound, were performed to evaluate the deep venous system(s) from the level of the common femoral vein through the popliteal and proximal calf veins. COMPARISON:  None. FINDINGS: VENOUS Normal compressibility of  the common femoral, superficial femoral, and popliteal veins, as well as the visualized calf veins. Visualized portions of profunda femoral vein and great saphenous vein unremarkable. No filling defects to suggest DVT on grayscale or color Doppler imaging. Doppler waveforms show normal direction of venous flow, normal respiratory phasicity and response to augmentation. Limited views of the contralateral common femoral vein are unremarkable. OTHER Edema noted in the legs bilaterally. Limitations: none IMPRESSION: No femoropopliteal DVT nor evidence of DVT within the visualized calf veins. If clinical symptoms are inconsistent or if there are persistent or worsening symptoms, further imaging (possibly involving the iliac veins) may be warranted. Electronically Signed   By: Lajean Manes M.D.   On: 07/27/2019 16:16   [place] EKG SITE RITE  Result Date: 07/30/2019 If Site Rite image not attached, placement could not be confirmed due to current cardiac rhythm.   Assessment and plan-   #Thrombocytosis, likely secondary to acute infection/inflammation and underlying iron deficiency. Platelet counts slightly trended  down  #Anemia, status post IV Venofer treatments.  Also blood transfusion 1 unit last week. Hemoglobin is 9.1 today.  Added reticulocyte panel.  Reticulocyte hemoglobin level at 30. I will hold off additional IV Venofer at this point.  #Vitamin B12 deficiency, status post vitamin B12 100 MCG IM daily x 5 I ordered weekly vitamin B12 x 4.  #Folate deficiency, continue folic acid 1 mg IV daily x3 Eventually when she is able to take oral medication, will switch her to oral folic acid daily.  #Leukocytosis due to surgery/infection/inflammation.  Continue antibiotics.  Patient is on TPN.  Fungal coverage. Leukocytosis trending down.  DVT prophylaxis with Lovenox.  Earlie Server, MD, PhD Hematology Oncology [facility] at [facility] Pager- [phone] 08/12/2019

## 2019-08-12 NOTE — Progress Notes (Signed)
Patients mid lower abdomen drain came out while she was rolling in bed for bed change. This nurse notified Dr. Lysle Pearl, no new orders, continue to monitor.

## 2019-08-12 NOTE — Consult Note (Signed)
[facility] Nurse ostomy consult note 7 day post op for end ileostomy and drain placement.  Consult for [place] team placed today.  Patient has observed staff care for her pouch but has not participated up to this point.  Stoma type/location: Midline abdominal end ileostomy with retention sutures present superior and distal to the stoma.  Area stays frequently moist from wounds and drains.   Stomal assessment/size: 90% dark and malodorous.  Is moist and producing stool and finger inside os reveals pink mucosa.  Peristomal assessment: retention sutures, see above.  Treatment options for stomal/peristomal skin: Will implement barrier ring and convex pouch Output liquid browns  Ostomy pouching: 1pc.convex with barrier ring.  Education provided: Talked through each step ofpouch change, including barrier ring, twice weekly pouch changes and rationale for convex pouch.  She is able to roll pouch closed with cueing today.  Enrolled patient in [facility] program: No WOC team will follow.  Domenic Moras MSN, RN, FNP-BC CWON Wound, Ostomy, Continence Nurse Pager [phone]

## 2019-08-12 NOTE — Progress Notes (Signed)
Nutrition Follow-up  DOCUMENTATION CODES:   Morbid obesity  INTERVENTION:  Provide Ensure Enlive po TID, each supplement provides 350 kcal and 20 grams of protein. Patient can only have vanilla or chocolate. She reports she cannot have the strawberry flavor.  Plan is to reduce TPN to half rate. Reduce to Clinimix E 5/20 at 40 mL/hr x 24 hrs + 20% ILE (lipids) at 20 mL/hr x 12 hrs.  NUTRITION DIAGNOSIS:   Inadequate oral intake related to acute illness as evidenced by NPO status.  Resolving - diet now advancing.  GOAL:   Patient will meet greater than or equal to 90% of their needs  Previously met with TPN; progressing with advancement of diet.  MONITOR:   Diet advancement, Labs, Weight trends, Skin, I & O's  REASON FOR ASSESSMENT:   NPO/Clear Liquid Diet    ASSESSMENT:   44 y/o female with uterine fibroids s/p laparoscopic converted to open total hysterectomy, left salpingectomy, cystoscopy and 25 cm small bowel resection (near distal ileum) secondary to bowel injury 3/8. Pt declined post operatively requiring exploratory laparotomy, lysis of adhesions, abdominal wound washout, resection of previous anastomosis with 23cm of small bowel removed and subsequent reanastomosis 3/14  -NGT was removed on 3/26. -Diet was advanced to clear liquids on 3/26, full liquids on 3/28, and regular this morning (3/29).  Patient reports she tolerated full liquids. She had not yet tried solid food at time of RD assessment. She was having some nausea. Patient is amenable to drinking an ONS between meals. She reports she cannot have strawberry. Patient is having ileostomy output and is passing flatus.  IV Access: right basilic double lumen PICC placed 3/16  Medications reviewed and include: acidophilus, gabapentin, Novolog 0-9 units Q6hrs, levothyroxine, meropenem.  Labs reviewed: CBG 124-144, Sodium 131, Chloride 95, TG 390.  I/O: 3975 mL UOP yesterday (1.5 mL/kg/hr) + 2 occurrences  unmeasured UOP; 20 mL output from drain yesterday; 300 mL output from ileostomy yesterday  Weight trend: 113 kg on 3/28; +4.8 kg from 3/23  Discussed with MD via secure chat. Plan is to reduce TPN to 1/2 rate today.  Diet Order:   Diet Order            Diet regular Room service appropriate? Yes; Fluid consistency: Thin  Diet effective now             EDUCATION NEEDS:   No education needs have been identified at this time  Skin:  Skin Assessment: Skin Integrity Issues:(Closed incision to abdomen)  Last BM:  3/28: type 7 per ileostomy  Height:   Ht Readings from Last 1 Encounters:  07/23/19 5' 2"  (1.575 m)   Weight:   Wt Readings from Last 1 Encounters:  08/11/19 113 kg   Ideal Body Weight:  50 kg  BMI:  Body mass index is 45.56 kg/m.  Estimated Nutritional Needs:   Kcal:  2400-2700kcal/day  Protein:  120-135g/day  Fluid:  1.8L/day  Jacklynn Barnacle, MS, RD, LDN Pager number available on Amion

## 2019-08-12 NOTE — Evaluation (Signed)
Physical Therapy Evaluation Patient Details Name: Karen Aguirre MRN: [id number] DOB: [birth year] Today's Date: 08/12/2019   History of Present Illness  Pt is a 44 y.o. female with attempted robotic hysterectomy causing SB perforation leading to repair on 3/8, leak and exploratory laparotomy on 3/14, pt experienced dehiscence and underwent exploratory laparotmoy and ileostomy on 08/06/19.    Clinical Impression  The patient with RN at bedside, reported nausea this AM as well as abdominal pain (3-4/10). The patient reported at baseline she is independent, works full time remotely, so does her husband, and has adult nieces/nephews to assist if needed.  The patient was instructed in log rolling technique to sit EOB, able to perform with CGA/supervision. Extended time sitting EOB due to complaints of light headedness and nausea, pt did complain of back pain once in sitting as well. BP assessed, inaccurate reading noted. Sit <> stand with RW and CGA, pt able to stand and march in place, and take several steps towards [facility], requested to sit due to significant increase in dizziness. Returned to supine, vitals assessed, WFLs. RN at bedside for pt education, further mobility held.  Overall the patient demonstrated deficits (see "PT Problem List") that impede the patient's functional abilities, safety, and mobility and would benefit from skilled PT intervention. Recommendation is HHPT with supervision for mobility/OOB pending pt progress. The patient would benefit from further orthostatic assessment, RN notified.      Follow Up Recommendations Home health PT;Supervision for mobility/OOB    Equipment Recommendations  Rolling walker with 5" wheels    Recommendations for Other Services OT consult     Precautions / Restrictions Precautions Precautions: Fall Precaution Comments: abdominal surgeries Restrictions Weight Bearing Restrictions: No      Mobility  Bed Mobility Overal bed mobility: Needs  Assistance Bed Mobility: Rolling;Sidelying to Sit Rolling: Supervision Sidelying to sit: Supervision       General bed mobility comments: cues for log rolling technique  Transfers Overall transfer level: Needs assistance Equipment used: Rolling walker (2 wheeled) Transfers: Sit to/from Stand Sit to Stand: Min guard            Ambulation/Gait   Gait Distance (Feet): 1 Feet Assistive device: Rolling walker (2 wheeled)       General Gait Details: Patient able to take several steps towards [facility], and march in place. Further mobility deferred due to dizziness and ostomy education RN entering room.  Stairs            Wheelchair Mobility    Modified Rankin (Stroke Patients Only)       Balance Overall balance assessment: Needs assistance Sitting-balance support: Feet supported Sitting balance-Leahy Scale: Fair Sitting balance - Comments: pt prefers at least unilateral support in sitting       Standing balance comment: poor                             Pertinent Vitals/Pain Pain Assessment: 0-10 Pain Score: 4  Pain Location: abdominal pain Pain Intervention(s): Limited activity within patient's tolerance;Monitored during session;Premedicated before session;Repositioned    Home Living Family/patient expects to be discharged to:: Private residence Living Arrangements: Spouse/significant other;Parent;Other relatives Available Help at Discharge: Family Type of Home: House Home Access: Stairs to enter Entrance Stairs-Rails: Psychiatric nurse of Steps: 3-4 Home Layout: Two level;Able to live on main level with bedroom/bathroom Home Equipment: None      Prior Function Level of Independence: Independent  Comments: works full time     Journalist, newspaper   Dominant Hand: Right    Extremity/Trunk Assessment   Upper Extremity Assessment Upper Extremity Assessment: Generalized weakness    Lower Extremity  Assessment Lower Extremity Assessment: Generalized weakness    Cervical / Trunk Assessment Cervical / Trunk Assessment: Other exceptions Cervical / Trunk Exceptions: abdominal surgery gauze, drain, ostomy  Communication   Communication: No difficulties  Cognition Arousal/Alertness: Awake/alert Behavior During Therapy: WFL for tasks assessed/performed Overall Cognitive Status: Within Functional Limits for tasks assessed                                        General Comments      Exercises Other Exercises Other Exercises: pt instructed in importance of early/continued mobility, up for meals, importance of ambulation   Assessment/Plan    PT Assessment Patient needs continued PT services  PT Problem List Decreased strength;Decreased mobility;Decreased range of motion;Decreased knowledge of precautions;Decreased activity tolerance;Decreased balance;Pain       PT Treatment Interventions DME instruction;Therapeutic activities;Gait training;Therapeutic exercise;Patient/family education;Stair training;Balance training;Functional mobility training;Neuromuscular re-education    PT Goals (Current goals can be found in the Care Plan section)  Acute Rehab PT Goals Patient Stated Goal: to feel better PT Goal Formulation: With patient Time For Goal Achievement: 08/26/19 Potential to Achieve Goals: Good    Frequency Min 2X/week   Barriers to discharge        Co-evaluation               AM-PAC PT "6 Clicks" Mobility  Outcome Measure Help needed turning from your back to your side while in a flat bed without using bedrails?: A Little Help needed moving from lying on your back to sitting on the side of a flat bed without using bedrails?: A Little Help needed moving to and from a bed to a chair (including a wheelchair)?: A Little Help needed standing up from a chair using your arms (e.g., wheelchair or bedside chair)?: A Little Help needed to walk in hospital  room?: A Little Help needed climbing 3-5 steps with a railing? : A Lot 6 Click Score: 17    End of Session Equipment Utilized During Treatment: Gait belt Activity Tolerance: Patient tolerated treatment well Patient left: in bed;with call bell/phone within reach;with nursing/sitter in room;with family/visitor present Nurse Communication: Mobility status PT Visit Diagnosis: Other abnormalities of gait and mobility (R26.89);Muscle weakness (generalized) (M62.81);Difficulty in walking, not elsewhere classified (R26.2)    Time: [id number] PT Time Calculation (min) (ACUTE ONLY): 25 min   Charges:   PT Evaluation $PT Eval Moderate Complexity: 1 Mod PT Treatments $Therapeutic Activity: 8-22 mins        Lieutenant Diego PT, DPT 10:42 AM,08/12/19

## 2019-08-13 ENCOUNTER — Inpatient Hospital Stay: Payer: Managed Care, Other (non HMO)

## 2019-08-13 LAB — COMPREHENSIVE METABOLIC PANEL
ALT: 36 U/L (ref 0–44)
AST: 33 U/L (ref 15–41)
Albumin: 2.5 g/dL — ABNORMAL LOW (ref 3.5–5.0)
Alkaline Phosphatase: 210 U/L — ABNORMAL HIGH (ref 38–126)
Anion gap: 10 (ref 5–15)
BUN: 12 mg/dL (ref 6–20)
CO2: 27 mmol/L (ref 22–32)
Calcium: 8.7 mg/dL — ABNORMAL LOW (ref 8.9–10.3)
Chloride: 94 mmol/L — ABNORMAL LOW (ref 98–111)
Creatinine, Ser: 0.58 mg/dL (ref 0.44–1.00)
GFR calc Af Amer: >60 mL/min (ref >60)
GFR calc non Af Amer: >60 mL/min (ref >60)
Glucose, Bld: 122 mg/dL — ABNORMAL HIGH (ref 70–99)
Potassium: 4.3 mmol/L (ref 3.5–5.1)
Sodium: 131 mmol/L — ABNORMAL LOW (ref 135–145)
Total Bilirubin: 0.4 mg/dL (ref 0.3–1.2)
Total Protein: 8.3 g/dL — ABNORMAL HIGH (ref 6.5–8.1)

## 2019-08-13 LAB — CBC WITH DIFFERENTIAL/PLATELET
Abs Immature Granulocytes: 0.32 10*3/uL — ABNORMAL HIGH (ref 0.00–0.07)
Basophils Absolute: 0 10*3/uL (ref 0.0–0.1)
Basophils Relative: 0 %
Eosinophils Absolute: 0.2 10*3/uL (ref 0.0–0.5)
Eosinophils Relative: 2 %
HCT: 29.9 % — ABNORMAL LOW (ref 36.0–46.0)
Hemoglobin: 10.4 g/dL — ABNORMAL LOW (ref 12.0–15.0)
Immature Granulocytes: 3 %
Lymphocytes Relative: 19 %
Lymphs Abs: 1.8 10*3/uL (ref 0.7–4.0)
MCH: 33.2 pg (ref 26.0–34.0)
MCHC: 34.8 g/dL (ref 30.0–36.0)
MCV: 95.5 fL (ref 80.0–100.0)
Monocytes Absolute: 0.8 10*3/uL (ref 0.1–1.0)
Monocytes Relative: 8 %
Neutro Abs: 6.2 10*3/uL (ref 1.7–7.7)
Neutrophils Relative %: 68 %
Platelets: 713 10*3/uL — ABNORMAL HIGH (ref 150–400)
RBC: 3.13 MIL/uL — ABNORMAL LOW (ref 3.87–5.11)
RDW: 16.1 % — ABNORMAL HIGH (ref 11.5–15.5)
WBC: 9.3 10*3/uL (ref 4.0–10.5)
nRBC: 0 % (ref 0.0–0.2)

## 2019-08-13 LAB — GLUCOSE, CAPILLARY
Glucose-Capillary: 118 mg/dL — ABNORMAL HIGH (ref 70–99)
Glucose-Capillary: 100 mg/dL — ABNORMAL HIGH (ref 70–99)
Glucose-Capillary: 95 mg/dL (ref 70–99)

## 2019-08-13 MED ORDER — METOCLOPRAMIDE HCL 5 MG/ML IJ SOLN
5.0000 mg | Freq: Four times a day (QID) | INTRAMUSCULAR | Status: DC | PRN
  Administered 2019-08-13 – 2019-08-14 (×4): 5 mg via INTRAVENOUS
  Filled 2019-08-13 (×4): qty 2

## 2019-08-13 MED ORDER — DIATRIZOATE MEGLUMINE & SODIUM 66-10 % PO SOLN: 90.0000 mL | Freq: Once | ORAL | Status: DC

## 2019-08-13 MED ORDER — SUCRALFATE 1 GM/10ML PO SUSP
1.0000 g | Freq: Two times a day (BID) | ORAL | Status: DC | PRN
  Filled 2019-08-13: qty 10

## 2019-08-13 MED ORDER — CLINIMIX E/DEXTROSE (5/20) 5 % IV SOLN
INTRAVENOUS | Status: AC
  Filled 2019-08-13: qty 400

## 2019-08-13 MED ORDER — FAT EMULSION PLANT BASED 20 % IV EMUL
360.0000 mL | INTRAVENOUS | Status: AC
  Administered 2019-08-13: 360 mL via INTRAVENOUS
  Filled 2019-08-13: qty 360

## 2019-08-13 MED ORDER — TRACE MINERALS CU-MN-SE-ZN 300-55-60-3000 MCG/ML IV SOLN
INTRAVENOUS | Status: AC
  Filled 2019-08-13: qty 2000

## 2019-08-13 NOTE — Progress Notes (Addendum)
PHARMACY - TOTAL PARENTERAL NUTRITION CONSULT NOTE   Indication:  s/p laparoscopic converted to open total hysterectomy, left salpingectomy, cystoscopy and small bowel resection (near distal ileum) secondary to bowel injury 3/8. Pt declined post operatively requiring another exploratory laparotomy, lysis of adhesions, abdominal wound washout, resection of previous anastomosis with a portion small bowel removed and subsequent reanastomosis 3/14, now  second anastamosis failure and revision 3/23  Patient Measurements: Height: 5\' 2"  (157.5 cm) Weight: 249 lb 1.9 oz (113 kg) IBW/kg (Calculated) : 50.1 TPN AdjBW (KG): 70.9 Body mass index is 45.56 kg/m.  Assessment: 44 y.o. female  s/p exploratory laparotomy and repair of small bowel enterotomy as well as hysterectomy with OB/GYN  Post-Op re-exploration abdominal washout and resection of original anastomosis x 2, hospital day #18  Glucose / Insulin: BG 124 - 144, continue SSI q6h, 2 units/ 24 hrs  Electrolytes: WNL Renal: Scr <1, stable LFTs / TGs: TG 198-->166 - - -> 390 Prealbumin / albumin: 13.6/2.6 Intake / Output: I/O net 3/29 = 381.59ml  Central access:  07/30/19 TPN start date: 07/30/19  Nutritional Goals per RD recommendation  Kcal:  2832kcal/day Protein:  120g/day Goal TPN rate is 100 mL/hr (Regimen at goal rate will provide 2832kcal/day, 100g/day protein, 2760 ml volume )  Per discussion with dietician/Dr Lysle Pearl, will be increasing TPN rate back to full rate given decreased oral intake/nausea  IV Fluid: none  ABX:  Eraxis 3/19>> Zosyn 3/12 >> 3/24 Meropenem 3/24 >>  Current Nutrition:  Fluctuating  Plan:   Increase Clinimix 5/20 with electrolytes to 100 ml/hr  Continue MVI on Mondays and Thursdays, trace elements daily  Increase 20% lipids to 44ml/hr x 12 hrs/day   Continue sensitive SSI q6h and adjust as needed   Monitor TPN labs on Mon/Thurs, per protocol  Will recheck electrolytes/TG's with Thursday am  labs  Lu Duffel, PharmD, BCPS Clinical Pharmacist 08/13/2019 11:53 AM

## 2019-08-13 NOTE — Evaluation (Signed)
Occupational Therapy Evaluation Patient Details Name: TYFFANI ZIMMERS MRN: [id number] DOB: 07/26/1975 Today's Date: 08/13/2019    History of Present Illness Pt is a 44 y.o. female with attempted robotic hysterectomy causing SB perforation leading to repair on 3/8, leak and exploratory laparotomy on 3/14, pt experienced dehiscence and underwent exploratory laparotmoy and ileostomy on 08/06/19.   Clinical Impression   Patient seen this AM for OT evaluation, supine in bed with mother-in-law present at bedside; agreeable to therapy.  Patient reports 3/10 pain at surgical site.  Requires encouragement for participation in [place] tasks.  Reports feeling dizzy when sitting up or standing.  HR 100-105 for all activities.  Patient sat at EOB with SBA and extra time.  Performed UB bathing with SBA but required assistance for back.  Performed SPT to recliner using RW with CGA/SBA.  Patient presents with generalized weakness, surgical precautions and pain resulting in decreased ability to safely perform BADLs.  Patient would benefit from continued occupational therapy services to address deficits in performance components.  Based on today's performance, recommending HH OT and SPV for all OOB activities.      Follow Up Recommendations  Home health OT;Other (comment)(SPV for OOB activities)    Equipment Recommendations  3 in 1 bedside commode    Recommendations for Other Services       Precautions / Restrictions Precautions Precautions: Fall Precaution Comments: abdominal surgeries Restrictions Weight Bearing Restrictions: No      Mobility Bed Mobility Overal bed mobility: Needs Assistance Bed Mobility: Supine to Sit     Supine to sit: Supervision;HOB elevated     General bed mobility comments: Mother-in-law present with good safety regarding drain/ostomy/IV  Transfers Overall transfer level: Needs assistance Equipment used: Rolling walker (2 wheeled) Transfers: Sit to/from [employer] Sit to Stand: Min guard;Supervision Stand pivot transfers: Min guard;Supervision       General transfer comment: Rquires uses of RW at this time for UE support.  Complaints of dizziness when sitting up or standing.    Balance                                           ADL either performed or assessed with clinical judgement   ADL Overall ADL's : Needs assistance/impaired         Upper Body Bathing: Sitting;Supervision/ safety Upper Body Bathing Details (indicate cue type and reason): Requires assistance for back Lower Body Bathing: Maximal assistance;Sitting/lateral leans Lower Body Bathing Details (indicate cue type and reason): limited bending/twisting ability         Toilet Transfer: Min guard;Supervision/safety;RW;Stand-pivot   Toileting- Clothing Manipulation and Hygiene: Moderate assistance;Sit to/from stand Toileting - Clothing Manipulation Details (indicate cue type and reason): Poor motivation to complete     Functional mobility during ADLs: Min guard;Supervision/safety;Rolling walker General ADL Comments: Requires extra time and encouragement for full participation     Vision Patient Visual Report: No change from baseline       Perception     Praxis      Pertinent Vitals/Pain Pain Assessment: 0-10 Pain Score: 3  Pain Location: abdominal pain Pain Descriptors / Indicators: Constant;Discomfort Pain Intervention(s): Limited activity within patient's tolerance;Monitored during session;Premedicated before session     Hand Dominance Right   Extremity/Trunk Assessment Upper Extremity Assessment Upper Extremity Assessment: Generalized weakness   Lower Extremity Assessment Lower Extremity Assessment: Defer to PT evaluation  Cervical / Trunk Assessment Cervical / Trunk Assessment: Other exceptions Cervical / Trunk Exceptions: abdominal surgery gauze, drain, ostomy   Communication Communication Communication: No  difficulties   Cognition Arousal/Alertness: Awake/alert Behavior During Therapy: WFL for tasks assessed/performed Overall Cognitive Status: Within Functional Limits for tasks assessed                                     General Comments       Exercises Other Exercises Other Exercises: Patient agreeable to sit up at EOB with encouragment from author and mother-in-law.  Sat at EOB and performed UB bathing with SBA and asisstance for back.  Performed SPT to recliner with use of RW and CGa/SBA.  HR 100-105 during activities. Other Exercises: Provided education on role and goals of OT in acute care setting Other Exercises: Provided education on importance of engaging in OOB activities or sitting in recliner to improve healing and overall health   Shoulder Instructions      Home Living Family/patient expects to be discharged to:: Private residence Living Arrangements: Spouse/significant other;Parent;Other relatives Available Help at Discharge: Family Type of Home: House Home Access: Stairs to enter [employer] of Steps: 3-4 Entrance Stairs-Rails: Right;Left Home Layout: Two level;Able to live on main level with bedroom/bathroom     Bathroom Shower/Tub: Tub/shower unit         Home Equipment: None          Prior Functioning/Environment Level of Independence: Independent        Comments: works full time        OT Problem List: Decreased strength;Decreased activity tolerance;Pain      OT Treatment/Interventions: Self-care/ADL training;Therapeutic exercise;Energy conservation;DME and/or AE instruction;Therapeutic activities;Patient/family education    OT Goals(Current goals can be found in the care plan section) Acute Rehab OT Goals Patient Stated Goal: "feel better again" OT Goal Formulation: With patient Time For Goal Achievement: 08/27/19 Potential to Achieve Goals: Good  OT Frequency: Min 1X/week   Barriers to D/C:             Co-evaluation              AM-PAC OT "6 Clicks" Daily Activity     Outcome Measure Help from another person eating meals?: None Help from another person taking care of personal grooming?: None Help from another person toileting, which includes using toliet, bedpan, or urinal?: A Little Help from another person bathing (including washing, rinsing, drying)?: A Lot Help from another person to put on and taking off regular upper body clothing?: None Help from another person to put on and taking off regular lower body clothing?: A Lot 6 Click Score: 19   End of Session Equipment Utilized During Treatment: Rolling walker;Gait belt Nurse Communication: Other (comment)(Spoke with NT that patient is in recliner without purewic)  Activity Tolerance: Patient tolerated treatment well;Patient limited by fatigue Patient left: in chair;with call bell/phone within reach;with family/visitor present  OT Visit Diagnosis: Muscle weakness (generalized) (M62.81)                Time: [id number] OT Time Calculation (min): 38 min Charges:  OT General Charges $OT Visit: 1 Visit OT Evaluation $OT Eval Moderate Complexity: 1 Mod OT Treatments $Self Care/Home Management : 8-22 mins $Therapeutic Activity: 23-37 mins  Baldomero Lamy, MS, OTR/L 08/13/19, 10:59 AM

## 2019-08-13 NOTE — Progress Notes (Signed)
Subjective:  CC: Karen Aguirre is a 44 y.o. female  Hospital stay day 22,  exploratory laparotomy and repair of small bowel enterotomy as well as hysterectomy with OB/GYN, repeat ex-lap and re-anastamosis for failed anastamosis  8 Days Post-Op  Reexploration abdominal washout, LOA, end ileostomy and drain placement.  HPI: Persistent nausea with emesis even with sips yesterday.  No new complaints of pain.  [place] drain also came out while moving out of bed.  ROS:  General: Denies weight loss, weight gain, fatigue, fevers, chills, and night sweats. Heart: Denies chest pain, palpitations, racing heart, irregular heartbeat, leg pain or swelling, and decreased activity tolerance. Respiratory: Denies breathing difficulty, shortness of breath, wheezing, cough, and sputum. GI: Denies change in appetite, heartburn, constipation, diarrhea, and blood in stool. GU: Denies difficulty urinating, pain with urinating, urgency, frequency, blood in urine.   Objective:   Temp:  [98 F (36.7 C)-99 F (37.2 C)] 98 F (36.7 C) (03/30 1154) Pulse Rate:  [88-97] 89 (03/30 1154) Resp:  [16-20] 18 (03/30 1154) BP: (135-159)/(64-86) 135/64 (03/30 1154) SpO2:  [97 %-98 %] 97 % (03/30 1154)     Height: _0  (157.5 cm) Weight: 113 kg BMI (Calculated): 45.55   Intake/Output this shift:   Intake/Output Summary (Last 24 hours) at 08/13/2019 1506 Last data filed at 08/13/2019 1500 Gross per 24 hour  Intake 240 ml  Output 1650 ml  Net -1410 ml   200 ostomy output 85 IR drain Constitutional :  alert, cooperative, appears stated age and mild distress  Respiratory:  clear to auscultation bilaterally  Cardiovascular:  regular rate and rhythm  Gastrointestinal: soft, focal guarding around incision improving now, ostomy patches of pink.  bilious output in bag.  Former Radiographer, therapeutic drain site with thicker sersanguinous discharge, still minimal.  IR drain with thicker output, more purulent looking.  staples intact,  serosanguinous drainge around retention sutures inferior aspect.  Skin: Cool and moist.   Psychiatric: Normal affect, non-agitated, not confused       LABS:  CMP Latest Ref Rng & Units 08/13/2019 08/12/2019 08/11/2019  Glucose 70 - 99 mg/dL 122(H) 141(H) 127(H)  BUN 6 - 20 mg/dL _1 Creatinine 0.44 - 1.00 mg/dL 0.58 0.53 0.38(L)  Sodium 135 - 145 mmol/L 131(L) 131(L) 132(L)  Potassium 3.5 - 5.1 mmol/L 4.3 4.3 4.6  Chloride 98 - 111 mmol/L 94(L) 95(L) 98  CO2 22 - 32 mmol/L _2 Calcium 8.9 - 10.3 mg/dL 8.7(L) 8.4(L) 8.3(L)  Total Protein 6.5 - 8.1 g/dL 8.3(H) 8.3(H) -  Total Bilirubin 0.3 - 1.2 mg/dL 0.4 0.3 -  Alkaline Phos 38 - 126 U/L 210(H) 218(H) -  AST 15 - 41 U/L 33 33 -  ALT 0 - 44 U/L 36 40 -   CBC Latest Ref Rng & Units 08/13/2019 08/12/2019 08/11/2019  WBC 4.0 - 10.5 K/uL 9.3 12.4(H) 14.2(H)  Hemoglobin 12.0 - 15.0 g/dL 10.4(L) 9.1(L) 8.9(L)  Hematocrit 36.0 - 46.0 % 29.9(L) 29.5(L) 28.8(L)  Platelets 150 - 400 K/uL 713(H) 736(H) 811(H)    RADS: CLINICAL DATA:  Small bowel obstruction.  EXAM: ABDOMEN - 1 VIEW  COMPARISON:  CT scan of the abdomen dated 07/31/2019  FINDINGS: Drainage catheters seen in the right lower abdomen. Minimal air in the bowel. Single loop of minimally distended small bowel in the left mid abdomen. Colon is not distended. Stomach is not distended.  Bones are normal.  IMPRESSION: Single loop of minimally distended small bowel in the  left mid abdomen.   Electronically Signed   By: Lorriane Shire M.D.   On: 08/13/2019 11:09 Assessment:   exploratory laparotomy and repair of small bowel enterotomy as well as hysterectomy with OB/GYN, repeat ex-lap and re-anastamosis for failed anastamosis. Reexploration for dehiscence, second anastamosis failure, abdominal washout, LOA, end ileostomy and drain placement.  Now with nausea and vomiting again, along with thicker purulent looking drainage from IR drain.  Despite this, labs  continue to downtrend, now with normal WBC and decreasing plt count.  Attempted SBFT but could not tolerate the contrast.  Plain films with no obvious concerns.  Pain meds changed to morphine, no major change in nausea, pain still controlled.  Nausea did start after taking in broth, possible instigating factor?  But nausea shouldn't persist this far out.  Ordered carafate prn and encouraged patient to try to drink if possible to further coat stomach from irritation that maybe causing nausea.  Alk Phos elevated but pt with hx of chole so unlikely to be of clinical significance.  Will increase TPN back to regular rate, switch to reglan prn since patient stated this works best for her nausea.  Will continue to monitor.  Also pt request for psych eval due to depressed mood, understandable due to circumstances.  Appreciate recs.  May consider scheduled reglan if labs, vitals, and overall clincial exam remain unchanged by tomorrow.  Pt and mother in law verbalized understanding.

## 2019-08-13 NOTE — Progress Notes (Signed)
Nutrition Follow-up  DOCUMENTATION CODES:   Morbid obesity  INTERVENTION:  Increase TPN back to goal regimen of Clinimix E 5/20 at 100 mL/hr x 24 hrs + 20% ILE (lipids) at 30 mL/hr x 12 hrs. Provides 2832 kcal, 120 grams of protein, 2760 mL fluid daily.  Continue trace elements daily in TPN and MVI in TPN per protocol in setting of shortage.  Continue daily weights.  NUTRITION DIAGNOSIS:   Inadequate oral intake related to acute illness as evidenced by NPO status.  Ongoing inadequate oral intake.  GOAL:   Patient will meet greater than or equal to 90% of their needs  Progressing with advancement of TPN back to goal rate.  MONITOR:   Diet advancement, Labs, Weight trends, Skin, I & O's  REASON FOR ASSESSMENT:   NPO/Clear Liquid Diet    ASSESSMENT:   44 y/o female with uterine fibroids s/p laparoscopic converted to open total hysterectomy, left salpingectomy, cystoscopy and 25 cm small bowel resection (near distal ileum) secondary to bowel injury 3/8. Pt declined post operatively requiring exploratory laparotomy, lysis of adhesions, abdominal wound washout, resection of previous anastomosis with 23cm of small bowel removed and subsequent reanastomosis 3/14   -NGT was removed on 3/26. -Diet was advanced to clear liquids on 3/26, full liquids on 3/28, and regular on 3/29.  Patient has not been able to try any solid foods. She has not been able to try any Ensure yet (marked as being refused in chart). Patient is having persistent nausea and cannot tolerate even sips now. Discussed with surgeon via secure chat. Plan is to increase TPN back to goal rate.  Medications reviewed and include: Novolog 0-9 units Q6hrs, levothyroxine, folic acid 1 mg daily, meropenem.  Labs reviewed: CBG 103-122. Still pending comprehensive metabolic panel that was ordered this AM.  IV Access: right basilic double lumen PICC placed 3/16  Diet Order:   Diet Order            Diet regular Room  service appropriate? Yes; Fluid consistency: Thin  Diet effective now             EDUCATION NEEDS:   No education needs have been identified at this time  Skin:  Skin Assessment: Skin Integrity Issues:(Closed incision to abdomen)  Last BM:  3/28: type 7 per ileostomy  Height:   Ht Readings from Last 1 Encounters:  07/23/19 5\' 2"  (1.575 m)   Weight:   Wt Readings from Last 1 Encounters:  08/11/19 113 kg   Ideal Body Weight:  50 kg  BMI:  Body mass index is 45.56 kg/m.  Estimated Nutritional Needs:   Kcal:  2400-2700kcal/day  Protein:  120-135g/day  Fluid:  1.8L/day  Jacklynn Barnacle, MS, RD, LDN Pager number available on Amion

## 2019-08-13 NOTE — Consult Note (Addendum)
[place] Nurse ostomy follow up Pt had ileostomy surgery performed 3/22; pouch change and teaching session was done yesterday. Current pouch is intact with good seal, mod amt liquid brown stool.  Discussed pouching routines and ordering supplies with patient and family member at the bedside.  Pt is familiar with ostomies since her father had one previously, she states.   Education provided: Discussed ileostomy output, characteristics, and dietary precautions and avoiding dehydration.  Educational materials left at the bedside and extra supplies ordered to the bedside for staff nurse use. Informed patient and family member I will perform another pouch change demonstration and teaching session tomorrow afternoon. Pt could benefit from home health assistance after discharge.  Enrolled patient in [place] Start Discharge program: Not yet Julien Girt MSN, [place], [place], [place], [place]

## 2019-08-13 NOTE — Consult Note (Signed)
[facility] Face-to-Face Psychiatry Consult   Reason for Consult: Depression  Referring Physician: Dr. Lysle Pearl Patient Identification: Karen Aguirre MRN:  [id number] Principal Diagnosis: <principal problem not specified> Diagnosis:  Active Problems:   Fibroid uterus   Pelvic pain   S/P laparotomy   Thrombocytosis (HCC)   Absolute anemia   Intra-abdominal fluid collection   Abdominal wall abscess   Vitamin B12 deficiency   Folate deficiency   Total Time spent with patient: 30 minutes  Subjective:   Karen Aguirre is a 44 y.o. female patient who was admitted for hysterectomy and needed bowel surgeries  HPI: Patient is a 44 year old female with a past psychiatric history of anxiety who presents for hysterectomy.  During the course of her procedure patient had her bowel injured and required further surgery.  Patient has been in the hospital for approximately 20+ days and was displaying depressed mood.  Psychiatry was asked to consult.  Upon evaluation patient is calm and cooperative with Probation officer.  Husband is at the bedside who patient asked to remain during the course of the interview.  Husband did not participate in the interview.  Patient states that she was born and raised in this area and does have some family surrounding including her mother whom she lives with.  She does acknowledge the recent passing of her father in September something that was somewhat difficult for her and continues to be a source of grief.  In addition to this patient is reporting stress due to her current situation and being in the hospital and having to have multiple procedures.  Patient denies feeling depressed, but rather reports feeling frustrated.  She does endorse occasional poor sleep while in the hospital.  Patient is agreeable to medication changes to target her insomnia while in the hospital.  Past Psychiatric History: Has been taking Lexapro for several years.  Medication was started as patient was  experiencing increased stress taking care of her father's ailments before his passing.  Patient denies any previous psychiatric hospitalizations or suicide attempts.  Risk to Self: Is patient at risk for suicide?: No Risk to Others:  No Prior Inpatient Therapy:  No Prior Outpatient Therapy:  Yes  Past Medical History:  Past Medical History:  Diagnosis Date  . Anxiety   . Hypothyroidism   . Thyroid disease     Past Surgical History:  Procedure Laterality Date  . BOWEL RESECTION N/A 07/22/2019   Procedure: SMALL BOWEL RESECTION;  Surgeon: Ward, Honor Loh, MD;  Location: [facility] ORS;  Service: Gynecology;  Laterality: N/A;  . BOWEL RESECTION  07/27/2019   Procedure: SMALL BOWEL RESECTION;  Surgeon: Ward, Honor Loh, MD;  Location: [facility] ORS;  Service: Gynecology;;  . CHOLECYSTECTOMY    . ECTOPIC PREGNANCY SURGERY    . HYSTERECTOMY ABDOMINAL WITH SALPINGECTOMY Bilateral 07/22/2019   Procedure: HYSTERECTOMY ABDOMINAL WITH lbilateral SALPINGECTOMY, removal of left anterior cul de sac nodule, cystoscopy;  Surgeon: Ward, Honor Loh, MD;  Location: [facility] ORS;  Service: Gynecology;  Laterality: Bilateral;  . ILEOSTOMY N/A 08/05/2019   Procedure: ILEOSTOMY;  Surgeon: Benjamine Sprague, DO;  Location: [facility] ORS;  Service: General;  Laterality: N/A;  . LAPAROTOMY N/A 07/22/2019   Procedure: LAPAROTOMY;  Surgeon: Ward, Honor Loh, MD;  Location: [facility] OR[facility];  Service: Gynecology;  Laterality: N/A;  . LAPAROTOMY N/A 07/27/2019   Procedure: EXPLORATORY LAPAROTOMY;  Surgeon: Ward, Honor Loh, MD;  Location: [facility] OR[facility];  Service: Gynecology;  Laterality: N/A;  . LAPAROTOMY N/A 08/05/2019   Procedure: EXPLORATORY LAPAROTOMY;  Surgeon: Benjamine Sprague, DO;  Location: [facility] ORS;  Service: General;  Laterality: N/A;  . ROBOTIC ASSISTED TOTAL HYSTERECTOMY WITH BILATERAL SALPINGO OOPHERECTOMY Bilateral 07/22/2019   Procedure: XI ROBOTIC ASSISTED TOTAL HYSTERECTOMY WITH BILATERAL SALPINGECTOMY _ATTEMPTED;  Surgeon: Ward, Honor Loh, MD;   Location: [facility] OR[facility];  Service: Gynecology;  Laterality: Bilateral;   Family History: History reviewed. No pertinent family history. Family Psychiatric  History: Unknown Social History:  Social History   Substance and Sexual Activity  Alcohol Use Yes   Comment: ocassional     Social History   Substance and Sexual Activity  Drug Use No    Social History   Socioeconomic History  . Marital status: Married    Spouse name: Not on file  . Number of children: Not on file  . Years of education: Not on file  . Highest education level: Not on file  Occupational History  . Not on file  Tobacco Use  . Smoking status: Former Research scientist (life sciences)  . Smokeless tobacco: Never Used  . Tobacco comment: 20 years ago  Substance and Sexual Activity  . Alcohol use: Yes    Comment: ocassional  . Drug use: No  . Sexual activity: Not on file  Other Topics Concern  . Not on file  Social History Narrative  . Not on file   Social Determinants of Health   Financial Resource Strain:   . Difficulty of Paying Living Expenses:   Food Insecurity:   . Worried About Charity fundraiser in the Last Year:   . Arboriculturist in the Last Year:   Transportation Needs:   . Film/video editor (Medical):   Marland Kitchen Lack of Transportation (Non-Medical):   Physical Activity:   . Days of Exercise per Week:   . Minutes of Exercise per Session:   Stress:   . Feeling of Stress :   Social Connections:   . Frequency of Communication with Friends and Family:   . Frequency of Social Gatherings with Friends and Family:   . Attends Religious Services:   . Active Member of Clubs or Organizations:   . Attends Archivist Meetings:   Marland Kitchen Marital Status:    Additional Social History:    Allergies:   Allergies  Allergen Reactions  . Cobalt Hives    Labs:  Results for orders placed or performed during the hospital encounter of 07/22/19 (from the past 48 hour(s))  Glucose, capillary     Status: Abnormal    Collection Time: 08/11/19  6:07 PM  Result Value Ref Range   Glucose-Capillary 125 (H) 70 - 99 mg/dL    Comment: Glucose reference range applies only to samples taken after fasting for at least 8 hours.  Glucose, capillary     Status: Abnormal   Collection Time: 08/12/19 12:01 AM  Result Value Ref Range   Glucose-Capillary 144 (H) 70 - 99 mg/dL    Comment: Glucose reference range applies only to samples taken after fasting for at least 8 hours.  Prealbumin     Status: None   Collection Time: 08/12/19  4:45 AM  Result Value Ref Range   Prealbumin 24.7 18 - 38 mg/dL    Comment: Performed at [facility] [address] [place], [place] [address]  Comprehensive metabolic panel     Status: Abnormal   Collection Time: 08/12/19  4:49 AM  Result Value Ref Range   Sodium 131 (L) 135 - 145 mmol/L   Potassium 4.3 3.5 -  5.1 mmol/L   Chloride 95 (L) 98 - 111 mmol/L   CO2 26 22 - 32 mmol/L   Glucose, Bld 141 (H) 70 - 99 mg/dL    Comment: Glucose reference range applies only to samples taken after fasting for at least 8 hours.   BUN 12 6 - 20 mg/dL   Creatinine, Ser 0.53 0.44 - 1.00 mg/dL   Calcium 8.4 (L) 8.9 - 10.3 mg/dL   Total Protein 8.3 (H) 6.5 - 8.1 g/dL   Albumin 2.5 (L) 3.5 - 5.0 g/dL   AST 33 15 - 41 U/L   ALT 40 0 - 44 U/L   Alkaline Phosphatase 218 (H) 38 - 126 U/L   Total Bilirubin 0.3 0.3 - 1.2 mg/dL   GFR calc non Af Amer >60 >60 mL/min   GFR calc Af Amer >60 >60 mL/min   Anion gap 10 5 - 15    Comment: Performed at [facility], [address], [place], [place] [address]  Magnesium     Status: None   Collection Time: 08/12/19  4:49 AM  Result Value Ref Range   Magnesium 2.3 1.7 - 2.4 mg/dL    Comment: Performed at [facility], [address] [place], [place] [address]  Phosphorus     Status: None   Collection Time: 08/12/19  4:49 AM  Result Value Ref Range   Phosphorus 3.8 2.5 - 4.6 mg/dL    Comment: Performed at [facility], [address] [place], [place] [address]  CBC     Status: Abnormal   Collection Time: 08/12/19  4:49 AM  Result Value Ref Range   WBC 12.4 (H) 4.0 - 10.5 K/uL   RBC 3.12 (L) 3.87 - 5.11 MIL/uL   Hemoglobin 9.1 (L) 12.0 - 15.0 g/dL   HCT 29.5 (L) 36.0 - 46.0 %   MCV 94.6 80.0 - 100.0 fL   MCH 29.2 26.0 - 34.0 pg   MCHC 30.8 30.0 - 36.0 g/dL   RDW 15.8 (H) 11.5 - 15.5 %   Platelets 736 (H) 150 - 400 K/uL   nRBC 0.2 0.0 - 0.2 %    Comment: Performed at [facility], [address] [place], [place] [address]  Differential     Status: Abnormal   Collection Time: 08/12/19  4:49 AM  Result Value Ref Range   Neutrophils Relative % 62 %   Neutro Abs 7.6 1.7 - 7.7 K/uL   Lymphocytes Relative 20 %   Lymphs Abs 2.5 0.7 - 4.0 K/uL   Monocytes Relative 8 %   Monocytes Absolute 1.0 0.1 - 1.0 K/uL   Eosinophils Relative 2 %   Eosinophils Absolute 0.3 0.0 - 0.5 K/uL   Basophils Relative 1 %   Basophils Absolute 0.1 0.0 - 0.1 K/uL   Immature Granulocytes 7 %   Abs Immature Granulocytes 0.87 (H) 0.00 - 0.07 K/uL    Comment: Performed at [facility], [address] [place], [place] [address]  Retic Panel     Status: Abnormal   Collection Time: 08/12/19  4:49 AM  Result Value Ref Range   Retic Ct Pct 7.0 (H) 0.4 - 3.1 %   RBC. 3.14 (L) 3.87 - 5.11 MIL/uL   Retic Count, Absolute 219.2 (H) 19.0 - 186.0 K/uL   Immature Retic Fract 33.3 (H) 2.3 - 15.9 %   Reticulocyte Hemoglobin 30.6 >27.9 pg    Comment: Performed at [facility], [address] [place],  [place] [address]  Triglycerides     Status: Abnormal   Collection Time: 08/12/19  4:50 AM  Result Value Ref Range   Triglycerides 390 (H) <150 mg/dL    Comment: Performed at [facility], [address] [place], [place] [address]  Glucose, capillary     Status: Abnormal   Collection Time: 08/12/19  5:11 AM  Result Value Ref Range   Glucose-Capillary 124 (H) 70 - 99 mg/dL    Comment: Glucose  reference range applies only to samples taken after fasting for at least 8 hours.  Glucose, capillary     Status: Abnormal   Collection Time: 08/12/19 11:58 AM  Result Value Ref Range   Glucose-Capillary 137 (H) 70 - 99 mg/dL    Comment: Glucose reference range applies only to samples taken after fasting for at least 8 hours.   Comment 1 Notify RN   Glucose, capillary     Status: Abnormal   Collection Time: 08/12/19  6:03 PM  Result Value Ref Range   Glucose-Capillary 122 (H) 70 - 99 mg/dL    Comment: Glucose reference range applies only to samples taken after fasting for at least 8 hours.  Glucose, capillary     Status: Abnormal   Collection Time: 08/12/19 11:42 PM  Result Value Ref Range   Glucose-Capillary 103 (H) 70 - 99 mg/dL    Comment: Glucose reference range applies only to samples taken after fasting for at least 8 hours.   Comment 1 Notify RN   CBC with Differential/Platelet     Status: Abnormal   Collection Time: 08/13/19  7:10 AM  Result Value Ref Range   WBC 9.3 4.0 - 10.5 K/uL   RBC 3.13 (L) 3.87 - 5.11 MIL/uL   Hemoglobin 10.4 (L) 12.0 - 15.0 g/dL   HCT 29.9 (L) 36.0 - 46.0 %   MCV 95.5 80.0 - 100.0 fL   MCH 33.2 26.0 - 34.0 pg   MCHC 34.8 30.0 - 36.0 g/dL   RDW 16.1 (H) 11.5 - 15.5 %   Platelets 713 (H) 150 - 400 K/uL   nRBC 0.0 0.0 - 0.2 %   Neutrophils Relative % 68 %   Neutro Abs 6.2 1.7 - 7.7 K/uL   Lymphocytes Relative 19 %   Lymphs Abs 1.8 0.7 - 4.0 K/uL   Monocytes Relative 8 %   Monocytes Absolute 0.8 0.1 - 1.0 K/uL   Eosinophils Relative 2 %   Eosinophils Absolute 0.2 0.0 - 0.5 K/uL   Basophils Relative 0 %   Basophils Absolute 0.0 0.0 - 0.1 K/uL   Immature Granulocytes 3 %   Abs Immature Granulocytes 0.32 (H) 0.00 - 0.07 K/uL    Comment: Performed at [facility], [address] [place], [place] [address]  Comprehensive metabolic panel     Status: Abnormal   Collection Time: 08/13/19 10:45 AM  Result Value Ref Range   Sodium 131 (L)  135 - 145 mmol/L   Potassium 4.3 3.5 - 5.1 mmol/L   Chloride 94 (L) 98 - 111 mmol/L   CO2 27 22 - 32 mmol/L   Glucose, Bld 122 (H) 70 - 99 mg/dL    Comment: Glucose reference range applies only to samples taken after fasting for at least 8 hours.   BUN 12 6 - 20 mg/dL   Creatinine, Ser 0.58 0.44 - 1.00 mg/dL   Calcium 8.7 (L) 8.9 - 10.3 mg/dL   Total Protein 8.3 (H) 6.5 - 8.1 g/dL  Albumin 2.5 (L) 3.5 - 5.0 g/dL   AST 33 15 - 41 U/L   ALT 36 0 - 44 U/L   Alkaline Phosphatase 210 (H) 38 - 126 U/L   Total Bilirubin 0.4 0.3 - 1.2 mg/dL   GFR calc non Af Amer >60 >60 mL/min   GFR calc Af Amer >60 >60 mL/min   Anion gap 10 5 - 15    Comment: Performed at [facility], [address] [place], [place] [address]  Glucose, capillary     Status: Abnormal   Collection Time: 08/13/19 11:52 AM  Result Value Ref Range   Glucose-Capillary 100 (H) 70 - 99 mg/dL    Comment: Glucose reference range applies only to samples taken after fasting for at least 8 hours.    Current Facility-Administered Medications  Medication Dose Route Frequency Provider Last Rate Last Admin  . Marland KitchenTPN (CLINIMIX-E) Adult  40 mL/hr Intravenous Continuous TPN Lu Duffel, [place] 40 mL/hr at 08/12/19 1833 New Bag at 08/12/19 1833  . Marland KitchenTPN (CLINIMIX-E) Adult   Intravenous Continuous TPN Lu Duffel, RPH       Followed by  . [START ON 08/14/2019] .TPN (CLINIMIX-E) Adult   Intravenous Continuous TPN Shanlever, Pierce Crane, RPH      . 0.9 %  sodium chloride infusion (Manually program via Guardrails IV Fluids)   Intravenous Once Sakai, Isami, DO      . 0.9 %  sodium chloride infusion   Intravenous PRN Lysle Pearl, Isami, DO   Stopped at 08/08/19 1425  . acidophilus (RISAQUAD) capsule 1 capsule  1 capsule Oral Daily [place], Isami, DO   Stopped at 08/13/19 [id number]  . anidulafungin (ERAXIS) 100 mg in sodium chloride 0.9 % 100 mL IVPB  100 mg Intravenous Q24H Sakai, Isami, DO 78 mL/hr at 08/12/19 1710 100 mg at 08/12/19  1710  . Chlorhexidine Gluconate Cloth 2 % PADS 6 each  6 each Topical Daily [place], Isami, DO   6 each at 08/13/19 [phone]  . [START ON 08/15/2019] cyanocobalamin ((VITAMIN B-12)) injection 1,000 mcg  1,000 mcg Intramuscular Weekly Earlie Server, MD      . diatrizoate meglumine-sodium (GASTROGRAFIN) 66-10 % solution 90 mL  90 mL Oral Once [place], [place]ie [place], DO   Stopped at 08/13/19 0815  . diphenhydrAMINE (BENADRYL) 12.5 MG/5ML elixir 12.5 mg  12.5 mg Oral Q6H PRN Shanlever, Pierce Crane, RPH      . enoxaparin (LOVENOX) injection 40 mg  40 mg Subcutaneous Q12H Sakai, Isami, DO   40 mg at 08/13/19 0839  . escitalopram (LEXAPRO) tablet 20 mg  20 mg Oral Daily Sakai, Isami, DO   Stopped at 08/12/19 1120  . Fat emulsion 20 % infusion 360 mL  360 mL Intravenous Continuous TPN Lu Duffel, RPH      . feeding supplement (ENSURE ENLIVE) (ENSURE ENLIVE) liquid 237 mL  237 mL Oral TID BM Sakai, Isami, DO      . folic acid 1 mg in sodium chloride 0.9 % 50 mL IVPB  1 mg Intravenous Daily Earlie Server, MD 100.4 mL/hr at 08/13/19 0837 1 mg at 08/13/19 0837  . insulin aspart (novoLOG) injection 0-9 Units  0-9 Units Subcutaneous Q6H Sakai, Isami, DO   1 Units at 08/12/19 1834  . ipratropium-albuterol (DUONEB) 0.5-2.5 (3) MG/3ML nebulizer solution 3 mL  3 mL Nebulization Q4H PRN Sakai, Isami, DO   3 mL at 07/28/19 1205  . levothyroxine (SYNTHROID, LEVOTHROID) injection 62.5 mcg  62.5 mcg Intravenous Daily Lysle Pearl, Isami,  DO   62.5 mcg at 08/13/19 0831  . LORazepam (ATIVAN) injection 1 mg  1 mg Intravenous QHS PRN,MR X 1 Sakai, Isami, DO   1 mg at 08/11/19 2127  . menthol-cetylpyridinium (CEPACOL) lozenge 3 mg  1 lozenge Oral Q2H PRN Sakai, Isami, DO      . meropenem (MERREM) 1 g in sodium chloride 0.9 % 100 mL IVPB  1 g Intravenous Q8H Leonel Ramsay, MD 200 mL/hr at 08/13/19 0640 1 g at 08/13/19 0640  . metoCLOPramide (REGLAN) injection 5 mg  5 mg Intravenous Q6H PRN Sakai, Isami, DO   5 mg at 08/13/19 0831  . morphine 2  MG/ML injection 1 mg  1 mg Intravenous Q3H PRN Sakai, Isami, DO   1 mg at 08/13/19 1034  . promethazine (PHENERGAN) injection 12.5 mg  12.5 mg Intravenous Q6H PRN Sakai, Isami, DO   12.5 mg at 08/13/19 1034  . sodium chloride (OCEAN) 0.65 % nasal spray 1 spray  1 spray Each Nare PRN Sakai, Isami, DO      . sodium chloride flush (NS) 0.9 % injection 10-40 mL  10-40 mL Intracatheter Q12H Sakai, Isami, DO   10 mL at 08/13/19 [id number]  . sucralfate (CARAFATE) 1 GM/10ML suspension 1 g  1 g Oral BID PRN Benjamine Sprague, DO        Musculoskeletal: Strength & Muscle Tone: within normal limits Gait & Station: unable to stand Patient leans: N/A  Psychiatric Specialty Exam: Physical Exam  Review of Systems  Blood pressure 135/64, pulse 89, temperature 98 F (36.7 C), temperature source Oral, resp. rate 18, height 5\' 2"  (1.575 m), weight 113 kg, SpO2 97 %.Body mass index is 45.56 kg/m.  General Appearance: Fairly Groomed  Eye Contact:  Fair  Speech:  Slow  Volume:  Normal  Mood:  Frustrated  Affect:  Congruent  Thought Process:  Coherent  Orientation:  Full (Time, Place, and Person)  Thought Content:  Logical  Suicidal Thoughts:  No  Homicidal Thoughts:  No  Memory:  Recent;   Fair  Judgement:  Intact  Insight:  Fair  Psychomotor Activity:  Normal  Concentration:  Concentration: Fair  Recall:  [employer] of Knowledge:  Fair  Language:  Fair  Akathisia:  No  Handed:  Right  AIMS (if indicated):     Assets:  Communication Skills Desire for Improvement  ADL's:  Intact  Cognition:  WNL  Sleep:        Treatment Plan Summary: 44 year old female with no significant past psychiatric history presenting for multiple surgeries and found to have dysphoric mood.  Upon evaluation patient's mood is appropriate to her situation.  No medication adjustments at this time would be indicated.  Psychiatry does agree with the use of lorazepam at night as needed for sleep as well as during the course of the  day for anxiety as long as used cautiously alongside opiate medications.  No medication changes at this time Continue lorazepam 1 mg as needed for sleep and anxiety Continue escitalopram 20 mg p.o. daily when patient is able to tolerate p.o.  Disposition: No evidence of imminent risk to self or others at present.   Patient does not meet criteria for psychiatric inpatient admission. Supportive therapy provided about ongoing stressors.  Dixie Dials, MD 08/13/2019 2:35 PM

## 2019-08-13 NOTE — TOC Initial Note (Signed)
Transition of Care [facility]) - Initial/Assessment Note    Patient Details  Name: Karen Aguirre MRN: [id number] Date of Birth: 11/27/1975  Transition of Care [facility]) CM/SW Contact:    Beverly Sessions, RN Phone Number: 08/13/2019, 1:43 PM  Clinical Narrative:                 Patient from home Lives at home with husband, niece and nephew  [facility]P [place] [facility] - denies issues obtaining medications  Patient will need home health RN and PT at discharge.  Patient agreeable to home health services.  Patient states she does not have a preference of home health agency.  Referral made to [facility] as they are in network with insurance.  Brad with [employer] notified of order for RW    Expected Discharge Plan: [place]     Patient Goals and CMS Choice     Choice offered to / list presented to : Patient  Expected Discharge Plan and Services Expected Discharge Plan: [place]       Living arrangements for the past 2 months: Single Family Home                           HH Arranged: RN, PT [facility] Agency: [place] ([place]) Date HH Agency Contacted: 08/13/19   Representative spoke with at [place]: Corene Cornea  Prior Living Arrangements/Services Living arrangements for the past 2 months: [place] Lives with:: Spouse Patient language and need for interpreter reviewed:: Yes Do you feel safe going back to the place where you live?: Yes      Need for Family Participation in Patient Care: Yes (Comment) Care giver support system in place?: Yes (comment)   Criminal Activity/Legal Involvement Pertinent to Current Situation/Hospitalization: No - Comment as needed  Activities of Daily Living Home Assistive Devices/Equipment: Eyeglasses ADL Screening (condition at time of admission) Patient's cognitive ability adequate to safely complete daily activities?: Yes Is the patient deaf or have difficulty hearing?:  No Does the patient have difficulty seeing, even when wearing glasses/contacts?: Yes(wears glasses) Does the patient have difficulty concentrating, remembering, or making decisions?: No Patient able to express need for assistance with ADLs?: Yes Does the patient have difficulty dressing or bathing?: No Independently performs ADLs?: Yes (appropriate for developmental age) Does the patient have difficulty walking or climbing stairs?: No Weakness of Legs: None Weakness of Arms/Hands: None  Permission Sought/Granted                  Emotional Assessment Appearance:: Appears stated age   Affect (typically observed): Flat     Psych Involvement: No (comment)  Admission diagnosis:  S/P laparotomy [Z98.890] Patient Active Problem List   Diagnosis Date Noted  . Vitamin B12 deficiency   . Folate deficiency   . Abdominal wall abscess   . Thrombocytosis ([place])   . Absolute anemia   . Intra-abdominal fluid collection   . Fibroid uterus 07/22/2019  . Pelvic pain 07/22/2019  . S/P laparotomy 07/22/2019   PCP:  Hortencia Pilar, MD Pharmacy:   [id number] [place], [place] [address] [place] [address] Phone: [phone] Fax: [phone]     Social Determinants of Health (SDOH) Interventions    Readmission Risk Interventions No flowsheet data found.

## 2019-08-14 DIAGNOSIS — Z1624 Resistance to multiple antibiotics: Secondary | ICD-10-CM

## 2019-08-14 DIAGNOSIS — B9620 Unspecified Escherichia coli [E. coli] as the cause of diseases classified elsewhere: Secondary | ICD-10-CM

## 2019-08-14 DIAGNOSIS — K631 Perforation of intestine (nontraumatic): Secondary | ICD-10-CM

## 2019-08-14 DIAGNOSIS — K651 Peritoneal abscess: Secondary | ICD-10-CM

## 2019-08-14 DIAGNOSIS — Z95828 Presence of other vascular implants and grafts: Secondary | ICD-10-CM

## 2019-08-14 LAB — CBC WITH DIFFERENTIAL/PLATELET
Abs Immature Granulocytes: 0.4 10*3/uL — ABNORMAL HIGH (ref 0.00–0.07)
Basophils Absolute: 0.1 10*3/uL (ref 0.0–0.1)
Basophils Relative: 1 %
Eosinophils Absolute: 0.4 10*3/uL (ref 0.0–0.5)
Eosinophils Relative: 4 %
HCT: 28.6 % — ABNORMAL LOW (ref 36.0–46.0)
Hemoglobin: 9.2 g/dL — ABNORMAL LOW (ref 12.0–15.0)
Immature Granulocytes: 5 %
Lymphocytes Relative: 27 %
Lymphs Abs: 2.3 10*3/uL (ref 0.7–4.0)
MCH: 29.5 pg (ref 26.0–34.0)
MCHC: 32.2 g/dL (ref 30.0–36.0)
MCV: 91.7 fL (ref 80.0–100.0)
Monocytes Absolute: 0.6 10*3/uL (ref 0.1–1.0)
Monocytes Relative: 8 %
Neutro Abs: 4.8 10*3/uL (ref 1.7–7.7)
Neutrophils Relative %: 55 %
Platelets: 523 10*3/uL — ABNORMAL HIGH (ref 150–400)
RBC: 3.12 MIL/uL — ABNORMAL LOW (ref 3.87–5.11)
RDW: 15.5 % (ref 11.5–15.5)
WBC: 8.6 10*3/uL (ref 4.0–10.5)
nRBC: 0.5 % — ABNORMAL HIGH (ref 0.0–0.2)

## 2019-08-14 LAB — GLUCOSE, CAPILLARY
Glucose-Capillary: 129 mg/dL — ABNORMAL HIGH (ref 70–99)
Glucose-Capillary: 137 mg/dL — ABNORMAL HIGH (ref 70–99)
Glucose-Capillary: 145 mg/dL — ABNORMAL HIGH (ref 70–99)

## 2019-08-14 MED ORDER — CLINIMIX E/DEXTROSE (5/20) 5 % IV SOLN
INTRAVENOUS | Status: AC
  Filled 2019-08-14: qty 400

## 2019-08-14 MED ORDER — METOCLOPRAMIDE HCL 5 MG/ML IJ SOLN: 5.0000 mg | Freq: Four times a day (QID) | INTRAMUSCULAR | Status: DC

## 2019-08-14 MED ORDER — METOCLOPRAMIDE HCL 5 MG/ML IJ SOLN
5.0000 mg | Freq: Four times a day (QID) | INTRAMUSCULAR | Status: DC
  Administered 2019-08-14 – 2019-08-19 (×19): 5 mg via INTRAVENOUS
  Filled 2019-08-14 (×19): qty 2

## 2019-08-14 MED ORDER — FAT EMULSION PLANT BASED 20 % IV EMUL
360.0000 mL | INTRAVENOUS | Status: AC
  Administered 2019-08-14: 360 mL via INTRAVENOUS
  Filled 2019-08-14: qty 360

## 2019-08-14 MED ORDER — TRACE MINERALS CU-MN-SE-ZN 300-55-60-3000 MCG/ML IV SOLN
INTRAVENOUS | Status: AC
  Filled 2019-08-14: qty 2000

## 2019-08-14 NOTE — TOC Progression Note (Signed)
Transition of Care [facility]) - Progression Note    Patient Details  Name: Karen Aguirre MRN: [id number] Date of Birth: 1975/09/17  [facility]) CM/SW Contact  Beverly Sessions, RN Phone Number: 08/14/2019, 9:16 AM  Clinical Narrative:    Per MD potential discharges for Friday.  Corene Cornea with [place] notified.  Per Corene Cornea if patient discharges on Friday start of care would be Monday.  MD updated   Expected Discharge Plan: [place]    Expected Discharge Plan and Services Expected Discharge Plan: [place] arrangements for the past 2 months: Single Family Home                           HH Arranged: RN, PT [facility] Agency: [place] ([place]) Date HH Agency Contacted: 08/13/19   Representative spoke with at [place]: [place] ([place]) Interventions    Readmission Risk Interventions No flowsheet data found.

## 2019-08-14 NOTE — Progress Notes (Signed)
Date of Admission:  07/22/2019     ID: Karen Aguirre is a 44 y.o. female Active Problems:   Fibroid uterus   Pelvic pain   S/P laparotomy   Thrombocytosis (HCC)   Absolute anemia   Intra-abdominal fluid collection   Abdominal wall abscess   Vitamin B12 deficiency   Folate deficiency    Subjective: Nauseous No fever No pain abdomen   Medications:  . sodium chloride   Intravenous Once  . acidophilus  1 capsule Oral Daily  . Chlorhexidine Gluconate Cloth  6 each Topical Daily  . [START ON 08/15/2019] cyanocobalamin  1,000 mcg Intramuscular Weekly  . diatrizoate meglumine-sodium  90 mL Oral Once  . enoxaparin (LOVENOX) injection  40 mg Subcutaneous Q12H  . escitalopram  20 mg Oral Daily  . feeding supplement (ENSURE ENLIVE)  237 mL Oral TID BM  . insulin aspart  0-9 Units Subcutaneous Q6H  . levothyroxine  62.5 mcg Intravenous Daily  . sodium chloride flush  10-40 mL Intracatheter Q12H    Objective: Vital signs in last 24 hours: Temp:  [97.7 F (36.5 C)-98.3 F (36.8 C)] 98.3 F (36.8 C) (03/31 0414) Pulse Rate:  [89-95] 95 (03/31 0414) Resp:  [16-18] 18 (03/31 0414) BP: (118-135)/(64-73) 118/67 (03/31 0414) SpO2:  [97 %-98 %] 98 % (03/31 0414) Weight:  [99.4 kg] 99.4 kg (03/31 0500)  PHYSICAL EXAM:  General: lethargic Lying with eyes closed Lungs: b/l air entry She did incentive spirometry upto 2000 yesterday Heart: s1s2 Abdomen:ileostomy 1 drain with reddish purulent discharge  Extremities: rt PICC Skin: No rashes or lesions. Or bruising Neurologic: Grossly non-focal  Lab Results Recent Labs    08/12/19 0449 08/12/19 0449 08/13/19 0710 08/13/19 1045 08/14/19 0605  WBC 12.4*   < > 9.3  --  8.6  HGB 9.1*   < > 10.4*  --  9.2*  HCT 29.5*   < > 29.9*  --  28.6*  NA 131*  --   --  131*  --   K 4.3  --   --  4.3  --   CL 95*  --   --  94*  --   CO2 26  --   --  27  --   BUN 12  --   --  12  --   CREATININE 0.53  --   --  0.58  --    < > = values  in this interval not displayed.   Liver Panel Recent Labs    08/12/19 0449 08/13/19 1045  PROT 8.3* 8.3*  ALBUMIN 2.5* 2.5*  AST 33 33  ALT 40 36  ALKPHOS 218* 210*  BILITOT 0.3 0.4   Sedimentation Rate No results for input(s): ESRSEDRATE in the last 72 hours. C-Reactive Protein No results for input(s): CRP in the last 72 hours.  Microbiology:  Studies/Results: DG Abd 1 View  Result Date: 08/13/2019 CLINICAL DATA:  Small bowel obstruction. EXAM: ABDOMEN - 1 VIEW COMPARISON:  CT scan of the abdomen dated 07/31/2019 FINDINGS: Drainage catheters seen in the right lower abdomen. Minimal air in the bowel. Single loop of minimally distended small bowel in the left mid abdomen. Colon is not distended. Stomach is not distended. Bones are normal. IMPRESSION: Single loop of minimally distended small bowel in the left mid abdomen. Electronically Signed   By: Lorriane Shire M.D.   On: 08/13/2019 11:09     Assessment/Plan:  Complicated surgical History with attempted robotic hysterectomy causing SB perforation leading  to repair on 3/8 Followed by leak and exp lap on 3/14, followed by dehiscence and exp lap and ileostomy on 08/06/19  Intra-abdominal infection with E.coli (MDR) and yeast in cultures on meropenem ( day 8)  and anidulafungin ( day 13) Leucocytosis resolved  and thrombocytosis improving Will DC anidulafungin tomorrow Discussed with patient and husband

## 2019-08-14 NOTE — Progress Notes (Signed)
Subjective:  CC: Karen Aguirre is a 44 y.o. female  Hospital stay day 23,  exploratory laparotomy and repair of small bowel enterotomy as well as hysterectomy with OB/GYN, repeat ex-lap and re-anastamosis for failed anastamosis  9 Days Post-Op  Reexploration abdominal washout, LOA, end ileostomy and drain placement.  HPI: Still some nausea but overall better than yesterday.  Tolerated some ice cream.  No new complaints of pain.  ROS:  General: Denies weight loss, weight gain, fatigue, fevers, chills, and night sweats. Heart: Denies chest pain, palpitations, racing heart, irregular heartbeat, leg pain or swelling, and decreased activity tolerance. Respiratory: Denies breathing difficulty, shortness of breath, wheezing, cough, and sputum. GI: Denies change in appetite, heartburn, constipation, diarrhea, and blood in stool. GU: Denies difficulty urinating, pain with urinating, urgency, frequency, blood in urine.   Objective:   Temp:  [97.7 F (36.5 C)-98.3 F (36.8 C)] 98.3 F (36.8 C) (03/31 0414) Pulse Rate:  [89-95] 95 (03/31 0414) Resp:  [16-18] 18 (03/31 0414) BP: (118-135)/(64-73) 118/67 (03/31 0414) SpO2:  [97 %-98 %] 98 % (03/31 0414) Weight:  [99.4 kg] 99.4 kg (03/31 0500)     Height: 5\' 2"  (157.5 cm) Weight: 99.4 kg BMI (Calculated): 40.07   Intake/Output this shift:   Intake/Output Summary (Last 24 hours) at 08/14/2019 [id number] Last data filed at 08/14/2019 0040 Gross per 24 hour  Intake 240 ml  Output 1300 ml  Net -1060 ml   650 ostomy output 10 IR drain Constitutional :  alert, cooperative, appears stated age and mild distress  Respiratory:  clear to auscultation bilaterally  Cardiovascular:  regular rate and rhythm  Gastrointestinal: Soft, minimal guarding around incision improving now, ostomy patches of pink.  bilious output in bag, moderate amount.  Former Radiographer, therapeutic drain site with thinner sersanguinous discharge today.  IR drain with same purulent output.  staples  intact, but skin line inbetween still not healed, especially near the inferior aspect. serosanguinous drainge around retention sutures scant now.  Skin: Cool and moist.   Psychiatric: Normal affect, non-agitated, not confused       LABS:  CMP Latest Ref Rng & Units 08/13/2019 08/12/2019 08/11/2019  Glucose 70 - 99 mg/dL 122(H) 141(H) 127(H)  BUN 6 - 20 mg/dL 12 12 11   Creatinine 0.44 - 1.00 mg/dL 0.58 0.53 0.38(L)  Sodium 135 - 145 mmol/L 131(L) 131(L) 132(L)  Potassium 3.5 - 5.1 mmol/L 4.3 4.3 4.6  Chloride 98 - 111 mmol/L 94(L) 95(L) 98  CO2 22 - 32 mmol/L 27 26 25   Calcium 8.9 - 10.3 mg/dL 8.7(L) 8.4(L) 8.3(L)  Total Protein 6.5 - 8.1 g/dL 8.3(H) 8.3(H) -  Total Bilirubin 0.3 - 1.2 mg/dL 0.4 0.3 -  Alkaline Phos 38 - 126 U/L 210(H) 218(H) -  AST 15 - 41 U/L 33 33 -  ALT 0 - 44 U/L 36 40 -   CBC Latest Ref Rng & Units 08/14/2019 08/13/2019 08/12/2019  WBC 4.0 - 10.5 K/uL 8.6 9.3 12.4(H)  Hemoglobin 12.0 - 15.0 g/dL 9.2(L) 10.4(L) 9.1(L)  Hematocrit 36.0 - 46.0 % 28.6(L) 29.9(L) 29.5(L)  Platelets 150 - 400 K/uL 523(H) 713(H) 736(H)    RADS: n/a Assessment:   exploratory laparotomy and repair of small bowel enterotomy as well as hysterectomy with OB/GYN, repeat ex-lap and re-anastamosis for failed anastamosis. Reexploration for dehiscence, second anastamosis failure, abdominal washout, LOA, end ileostomy and drain placement.  Nausea has improved since yesterday.  Two days of no leukocytosis, and thrombocytosis continues to improve.   Ostomy  continues to be patent and functional.   Inferior wound base clean with pink granulation tissue, continued serosanguinous drainage, IR drain with decreased output today, but still purulent.  Hopefully d/c in next couple days.  Will consider weaning TPN starting tomorrow if she is able to increase her oral intake today.  Will also confirm ostomy care and supplies, possible PT, and IR drain care home health orders arranged.  All questions and  concerns addressed at this time.

## 2019-08-14 NOTE — Progress Notes (Signed)
Physical Therapy Treatment Patient Details Name: Karen Aguirre MRN: [id number] DOB: April 02, 1976 Today's Date: 08/14/2019    History of Present Illness Pt is a 44 y.o. female with attempted robotic hysterectomy causing SB perforation leading to repair on 3/8, leak and exploratory laparotomy on 3/14, pt experienced dehiscence and underwent exploratory laparotmoy and ileostomy on 08/06/19.    PT Comments    Patient alert, agreeable to PT family reported she had just returned from the bathroom. Orthostatic vitals assessed due to pt complaints of dizziness with mobility, see vitals flowsheet but WFLs. Log rolling technique performed without verbal cues, supervision and use of bed rails. Fair sitting balance noted, pt complained of increasing back pain/abdominal pain with sitting/standing/walking. Able to ambulate a total ~70ft with RW and CGA. Decreased gait velocity noted, but steady, chair follow provided. Pt did endorse some dizziness after ambulating as well. Pt up in chair, all needs in reach at end of session. RN notified of pt pain medication request. Current recommendation remains appropriate, and the patient would benefit from further skilled PT intervention to return to PLOF.     Follow Up Recommendations  Home health PT;Supervision for mobility/OOB     Equipment Recommendations  Rolling walker with 5" wheels    Recommendations for Other Services OT consult     Precautions / Restrictions Precautions Precautions: Fall Precaution Comments: abdominal surgeries Restrictions Weight Bearing Restrictions: No    Mobility  Bed Mobility Overal bed mobility: Needs Assistance Bed Mobility: Rolling;Sidelying to Sit Rolling: Supervision Sidelying to sit: Supervision       General bed mobility comments: pt utilizes bed rails to perform, no physical assist needed from PT; management of drain/ostomy and IV  Transfers Overall transfer level: Needs assistance Equipment used: Rolling  walker (2 wheeled) Transfers: Sit to/from Stand Sit to Stand: Min guard            Ambulation/Gait   Gait Distance (Feet): (84ft, and then37ft) Assistive device: Rolling walker (2 wheeled)       General Gait Details: Decreased gait velocity, pt endorsed some dizziness but main complaint is back and abdominal pain, further mobility held due to pain, RN notified.   Stairs             Wheelchair Mobility    Modified Rankin (Stroke Patients Only)       Balance Overall balance assessment: Needs assistance Sitting-balance support: Feet supported Sitting balance-Leahy Scale: Fair Sitting balance - Comments: pt prefers at least unilateral support in sitting     Standing balance-Leahy Scale: Fair                              Cognition Arousal/Alertness: Awake/alert Behavior During Therapy: WFL for tasks assessed/performed;Flat affect Overall Cognitive Status: Within Functional Limits for tasks assessed                                        Exercises Other Exercises Other Exercises: BP assessed, see vitals flowsheet for details    General Comments        Pertinent Vitals/Pain Pain Assessment: 0-10 Pain Score: 8  Pain Location: abdominal pain, back pain Pain Descriptors / Indicators: Constant;Discomfort Pain Intervention(s): Limited activity within patient's tolerance;Monitored during session;Premedicated before session;Repositioned    Home Living  Prior Function            PT Goals (current goals can now be found in the care plan section) Progress towards PT goals: Progressing toward goals    Frequency    Min 2X/week      PT Plan Current plan remains appropriate    Co-evaluation              AM-PAC PT "6 Clicks" Mobility   Outcome Measure  Help needed turning from your back to your side while in a flat bed without using bedrails?: A Little Help needed moving from lying on  your back to sitting on the side of a flat bed without using bedrails?: A Little Help needed moving to and from a bed to a chair (including a wheelchair)?: A Little Help needed standing up from a chair using your arms (e.g., wheelchair or bedside chair)?: A Little Help needed to walk in hospital room?: A Little Help needed climbing 3-5 steps with a railing? : A Lot 6 Click Score: 17    End of Session Equipment Utilized During Treatment: Gait belt Activity Tolerance: Patient limited by pain Patient left: in chair;with family/visitor present;with call bell/phone within reach Nurse Communication: Mobility status PT Visit Diagnosis: Other abnormalities of gait and mobility (R26.89);Muscle weakness (generalized) (M62.81);Difficulty in walking, not elsewhere classified (R26.2)     Time: 0930-1004 PT Time Calculation (min) (ACUTE ONLY): 34 min  Charges:  $Therapeutic Exercise: 23-37 mins                     Lieutenant Diego PT, DPT 10:17 AM,08/14/19

## 2019-08-14 NOTE — Consult Note (Signed)
[place] Nurse ostomy follow-up consult note Stoma type/location: Midline abdominal end ileostomy with retention sutures present superior and distal to the stoma.  Area stays frequently moist from wounds and drains.   Stomal assessment/size: 90% loose brown slough, 10% red, slightly above skin level, 1 3/4 inches Peristomal assessment: retention sutures above and below create a difficult pouching situation. Output: mod amt liquid brown stool  Ostomy pouching: 1pc.convex with barrier ring.  Education provided: Demonstrated pouch change to patient, mother, and husband at bedside.  Pt will not look at the stoma.  Husband was able to apply pouch and barrier ring with assistance and patient was able to open and close to empty.  Discussed ordering supplies and pouching routines.  Reviewed dietary precautions and avoiding dehydration again.  5 sets of barrier rings and pouches left at the bedside for use after discharge.  Pt could benefit from home health assistance after discharge.   Enrolled patient in [place] program: Yes Julien Girt MSN, RN, [place], [place], [place]

## 2019-08-14 NOTE — Progress Notes (Signed)
PHARMACY - TOTAL PARENTERAL NUTRITION CONSULT NOTE   Indication:  s/p laparoscopic converted to open total hysterectomy, left salpingectomy, cystoscopy and small bowel resection (near distal ileum) secondary to bowel injury 3/8. Pt declined post operatively requiring another exploratory laparotomy, lysis of adhesions, abdominal wound washout, resection of previous anastomosis with a portion small bowel removed and subsequent reanastomosis 3/14, now  second anastamosis failure and revision 3/23  Patient Measurements: Height: 5\' 2"  (157.5 cm) Weight: 219 lb 2.2 oz (99.4 kg) IBW/kg (Calculated) : 50.1 TPN AdjBW (KG): 70.9 Body mass index is 40.08 kg/m.  Assessment: 44 y.o. female  s/p exploratory laparotomy and repair of small bowel enterotomy as well as hysterectomy with OB/GYN  Post-Op re-exploration abdominal washout and resection of original anastomosis x 2, hospital day #19  Glucose / Insulin: BG 129 - 137, continue SSI q6h, 0 units/ 24 hrs  Electrolytes: WNL Renal: Scr <1, stable LFTs / TGs: TG 198-->166 - - -> 390 Prealbumin / albumin: 13.6/2.6 Intake / Output: I/O net 3/29 = -1070 ml  Central access:  07/30/19 TPN start date: 07/30/19  Nutritional Goals per RD recommendation  Kcal:  2832kcal/day Protein:  120g/day Goal TPN rate is 100 mL/hr (Regimen at goal rate will provide 2832kcal/day, 100g/day protein, 2760 ml volume )  Will be continuing TPN  full rate today, with a chance of decreasing rate tomorrow if oral intake continues to improve  IV Fluid: none  ABX:  Eraxis 3/19>> Zosyn 3/12 >> 3/24 Meropenem 3/24 >>  Current Nutrition:  Fluctuating  Plan:   Continue Clinimix 5/20 with electrolytes @ 100 ml/hr  Continue MVI on Mondays and Thursdays, trace elements daily  Continue 20% lipids @ 57ml/hr x 12 hrs/day   Continue sensitive SSI q6h and adjust as needed   Monitor TPN labs on Mon/Thurs, per protocol  Will recheck electrolytes/TG's with Thursday am  labs  Lu Duffel, PharmD, BCPS Clinical Pharmacist 08/14/2019 10:48 AM

## 2019-08-14 NOTE — Progress Notes (Signed)
OT Cancellation Note  Patient Details Name: Karen Aguirre MRN: [id number] DOB: [birth year]   Cancelled Treatment:    Reason Eval/Treat Not Completed: Fatigue/lethargy limiting ability to participate;Other (comment)   Patient supine in bed upon entry.  Patient states she is feeling too nauseas to participate.  Will follow up as appropriate and available.  Thank you.  Oren Binet 08/14/2019, 3:54 PM

## 2019-08-15 ENCOUNTER — Encounter: Payer: Self-pay | Admitting: Obstetrics & Gynecology

## 2019-08-15 ENCOUNTER — Inpatient Hospital Stay: Payer: Managed Care, Other (non HMO)

## 2019-08-15 LAB — CBC WITH DIFFERENTIAL/PLATELET
Abs Immature Granulocytes: 0.11 10*3/uL — ABNORMAL HIGH (ref 0.00–0.07)
Basophils Absolute: 0 10*3/uL (ref 0.0–0.1)
Basophils Relative: 1 %
Eosinophils Absolute: 0.2 10*3/uL (ref 0.0–0.5)
Eosinophils Relative: 2 %
HCT: 30.3 % — ABNORMAL LOW (ref 36.0–46.0)
Hemoglobin: 9.7 g/dL — ABNORMAL LOW (ref 12.0–15.0)
Immature Granulocytes: 1 %
Lymphocytes Relative: 31 %
Lymphs Abs: 2.5 10*3/uL (ref 0.7–4.0)
MCH: 29.3 pg (ref 26.0–34.0)
MCHC: 32 g/dL (ref 30.0–36.0)
MCV: 91.5 fL (ref 80.0–100.0)
Monocytes Absolute: 0.6 10*3/uL (ref 0.1–1.0)
Monocytes Relative: 7 %
Neutro Abs: 4.7 10*3/uL (ref 1.7–7.7)
Neutrophils Relative %: 58 %
Platelets: 467 10*3/uL — ABNORMAL HIGH (ref 150–400)
RBC: 3.31 MIL/uL — ABNORMAL LOW (ref 3.87–5.11)
RDW: 15.1 % (ref 11.5–15.5)
WBC: 8.1 10*3/uL (ref 4.0–10.5)
nRBC: 0 % (ref 0.0–0.2)

## 2019-08-15 LAB — COMPREHENSIVE METABOLIC PANEL
ALT: 30 U/L (ref 0–44)
AST: 28 U/L (ref 15–41)
Albumin: 2.9 g/dL — ABNORMAL LOW (ref 3.5–5.0)
Alkaline Phosphatase: 212 U/L — ABNORMAL HIGH (ref 38–126)
Anion gap: 9 (ref 5–15)
BUN: 12 mg/dL (ref 6–20)
CO2: 25 mmol/L (ref 22–32)
Calcium: 8.7 mg/dL — ABNORMAL LOW (ref 8.9–10.3)
Chloride: 97 mmol/L — ABNORMAL LOW (ref 98–111)
Creatinine, Ser: 0.5 mg/dL (ref 0.44–1.00)
GFR calc Af Amer: >60 mL/min (ref >60)
GFR calc non Af Amer: >60 mL/min (ref >60)
Glucose, Bld: 132 mg/dL — ABNORMAL HIGH (ref 70–99)
Potassium: 4 mmol/L (ref 3.5–5.1)
Sodium: 131 mmol/L — ABNORMAL LOW (ref 135–145)
Total Bilirubin: 0.2 mg/dL — ABNORMAL LOW (ref 0.3–1.2)
Total Protein: 8.4 g/dL — ABNORMAL HIGH (ref 6.5–8.1)

## 2019-08-15 LAB — GLUCOSE, CAPILLARY
Glucose-Capillary: 111 mg/dL — ABNORMAL HIGH (ref 70–99)
Glucose-Capillary: 136 mg/dL — ABNORMAL HIGH (ref 70–99)
Glucose-Capillary: 141 mg/dL — ABNORMAL HIGH (ref 70–99)
Glucose-Capillary: 145 mg/dL — ABNORMAL HIGH (ref 70–99)

## 2019-08-15 LAB — PHOSPHORUS: Phosphorus: 3.4 mg/dL (ref 2.5–4.6)

## 2019-08-15 LAB — MAGNESIUM: Magnesium: 2.3 mg/dL (ref 1.7–2.4)

## 2019-08-15 LAB — TRIGLYCERIDES: Triglycerides: 361 mg/dL — ABNORMAL HIGH (ref <150)

## 2019-08-15 MED ORDER — FAT EMULSION PLANT BASED 20 % IV EMUL
360.0000 mL | INTRAVENOUS | Status: AC
  Administered 2019-08-15: 360 mL via INTRAVENOUS
  Filled 2019-08-15: qty 360

## 2019-08-15 MED ORDER — CLINIMIX E/DEXTROSE (5/20) 5 % IV SOLN
INTRAVENOUS | Status: AC
  Filled 2019-08-15: qty 400

## 2019-08-15 MED ORDER — FAT EMULSION PLANT BASED 20 % IV EMUL
360.0000 mL | INTRAVENOUS | Status: DC
  Filled 2019-08-15: qty 400

## 2019-08-15 MED ORDER — IOHEXOL 9 MG/ML PO SOLN
500.0000 mL | ORAL | Status: AC
  Administered 2019-08-15: 500 mL via ORAL

## 2019-08-15 MED ORDER — TRACE MINERALS CU-MN-SE-ZN 300-55-60-3000 MCG/ML IV SOLN
INTRAVENOUS | Status: AC
  Filled 2019-08-15: qty 2000

## 2019-08-15 MED ORDER — IOHEXOL 300 MG/ML  SOLN
100.0000 mL | Freq: Once | INTRAMUSCULAR | Status: AC | PRN
  Administered 2019-08-15: 100 mL via INTRAVENOUS

## 2019-08-15 MED ORDER — ENOXAPARIN SODIUM 40 MG/0.4ML ~~LOC~~ SOLN
40.0000 mg | SUBCUTANEOUS | Status: DC
  Administered 2019-08-16 – 2019-08-18 (×3): 40 mg via SUBCUTANEOUS
  Filled 2019-08-15 (×4): qty 0.4

## 2019-08-15 NOTE — TOC Progression Note (Addendum)
Transition of Care [facility]) - Progression Note    Patient Details  Name: Karen Aguirre MRN: [id number] Date of Birth: [birth year]  [facility] CM/SW Contact  Beverly Sessions, RN Phone Number: 08/15/2019, 9:42 AM  Clinical Narrative:    Patient would potentially qualify for LTACH.  RNCM has reached out to MD to see if he would like to consider   Update:  Patient and family not interested in [place].  MD notified   Per MD patient will require TPN at discharge.  Pam with [facility] notified. At this time plan is for discharge on monday Expected Discharge Plan: [place]    Expected Discharge Plan and Services Expected Discharge Plan: [place] arrangements for the past 2 months: Single Family Home                           HH Arranged: RN, PT [facility] Agency: [place] ([place]) Date [place]: 08/13/19   Representative spoke with at [place]: [place] ([place]) Interventions    Readmission Risk Interventions No flowsheet data found.

## 2019-08-15 NOTE — Progress Notes (Signed)
PHARMACIST - PHYSICIAN COMMUNICATION  CONCERNING:  Enoxaparin (Lovenox) for DVT Prophylaxis    RECOMMENDATION: Patient was prescribed enoxaprin 40mg  q12  hours for VTE prophylaxis and BMI >40   Filed Weights   08/14/19 0500 08/15/19 0346 08/15/19 0700  Weight: 99.4 kg (219 lb 2.2 oz) 96.4 kg (212 lb 8.4 oz) 96.7 kg (213 lb 3 oz)    Body mass index is 38.99 kg/m.  Estimated Creatinine Clearance: 100.4 mL/min (by C-G formula based on SCr of 0.5 mg/dL).   Weight has been updated this week.    Patient is now a candidate for enoxaparin 40mg  every 24 hours based on BMI <40 and CrCl >30 ml/min.   DESCRIPTION: Pharmacy has adjusted enoxaparin dose per [facility]/[place] policy.  Patient is now receiving enoxaparin 40mg  every 24 hours.   Pernell Dupre, PharmD, BCPS Clinical Pharmacist 08/15/2019 9:41 AM

## 2019-08-15 NOTE — Progress Notes (Signed)
Subjective:  CC: Karen Aguirre is a 44 y.o. female  Hospital stay day 24,  exploratory laparotomy and repair of small bowel enterotomy as well as hysterectomy with OB/GYN, repeat ex-lap and re-anastamosis for failed anastamosis  10 Days Post-Op  Reexploration abdominal washout, LOA, end ileostomy and drain placement.  HPI: Persistent Nausea with one episode of emesis yestreday. Tried small amounts of ice cream.  No new complaints of pain.  ROS:  General: Denies weight loss, weight gain, fatigue, fevers, chills, and night sweats. Heart: Denies chest pain, palpitations, racing heart, irregular heartbeat, leg pain or swelling, and decreased activity tolerance. Respiratory: Denies breathing difficulty, shortness of breath, wheezing, cough, and sputum. GI: Denies change in appetite, heartburn, constipation, diarrhea, and blood in stool. GU: Denies difficulty urinating, pain with urinating, urgency, frequency, blood in urine.   Objective:   Temp:  [98.2 F (36.8 C)-98.5 F (36.9 C)] 98.2 F (36.8 C) (04/01 1154) Pulse Rate:  [97-104] 97 (04/01 1154) Resp:  [15-20] 15 (04/01 1154) BP: (124-156)/(73-90) 124/76 (04/01 1154) SpO2:  [97 %-99 %] 98 % (04/01 1154) Weight:  [96.4 kg-96.7 kg] 96.7 kg (04/01 0700)     Height: 5\' 2"  (157.5 cm) Weight: 96.7 kg BMI (Calculated): 38.98   Intake/Output this shift:   Intake/Output Summary (Last 24 hours) at 08/15/2019 1418 Last data filed at 08/15/2019 1026 Gross per 24 hour  Intake 550 ml  Output 1075 ml  Net -525 ml   725 ostomy output 20 IR drain Constitutional :  alert, cooperative, appears stated age and mild distress  Respiratory:  clear to auscultation bilaterally  Cardiovascular:  regular rate and rhythm  Gastrointestinal: Soft, minimal guarding around incision improving now, ostomy patches of pink.  bilious output in bag, moderate amount.  Former Radiographer, therapeutic drain site with thinner sersanguinous discharge today.  IR drain with same purulent  output.  staples intact, but skin line inbetween still not healed, especially near the inferior aspect. serosanguinous drainge around retention sutures scant now.  Skin: Cool and moist.   Psychiatric: Normal affect, non-agitated, not confused       LABS:  CMP Latest Ref Rng & Units 08/15/2019 08/13/2019 08/12/2019  Glucose 70 - 99 mg/dL 132(H) 122(H) 141(H)  BUN 6 - 20 mg/dL 12 12 12   Creatinine 0.44 - 1.00 mg/dL 0.50 0.58 0.53  Sodium 135 - 145 mmol/L 131(L) 131(L) 131(L)  Potassium 3.5 - 5.1 mmol/L 4.0 4.3 4.3  Chloride 98 - 111 mmol/L 97(L) 94(L) 95(L)  CO2 22 - 32 mmol/L 25 27 26   Calcium 8.9 - 10.3 mg/dL 8.7(L) 8.7(L) 8.4(L)  Total Protein 6.5 - 8.1 g/dL 8.4(H) 8.3(H) 8.3(H)  Total Bilirubin 0.3 - 1.2 mg/dL 0.2(L) 0.4 0.3  Alkaline Phos 38 - 126 U/L 212(H) 210(H) 218(H)  AST 15 - 41 U/L 28 33 33  ALT 0 - 44 U/L 30 36 40   CBC Latest Ref Rng & Units 08/15/2019 08/14/2019 08/13/2019  WBC 4.0 - 10.5 K/uL 8.1 8.6 9.3  Hemoglobin 12.0 - 15.0 g/dL 9.7(L) 9.2(L) 10.4(L)  Hematocrit 36.0 - 46.0 % 30.3(L) 28.6(L) 29.9(L)  Platelets 150 - 400 K/uL 467(H) 523(H) 713(H)    RADS: n/a Assessment:   exploratory laparotomy and repair of small bowel enterotomy as well as hysterectomy with OB/GYN, repeat ex-lap and re-anastamosis for failed anastamosis. Reexploration for dehiscence, second anastamosis failure, abdominal washout, LOA, end ileostomy and drain placement.  Persistent nausea and episode of emesis yeterday despite schedule regulan.  no leukocytosis, and thrombocytosis continues to  improve.   Ostomy continues to be patent and functional.   Inferior wound base clean with pink granulation tissue, continued serosanguinous drainage, IR drain with minimal output again, but still purulent.  Repeat CT, ideally with oral contrast if she is able to, to assess whether or not partial obstruction is cause?  IR drain with purulence but no white count with minimal drainage, so less likely to be cause  of small degree of ileus?    Will also confirm ostomy care and supplies, possible PT, and IR drain care home health orders arranged.  All questions and concerns addressed at this time.

## 2019-08-15 NOTE — Progress Notes (Signed)
PHARMACY - TOTAL PARENTERAL NUTRITION CONSULT NOTE   Indication:  s/p laparoscopic converted to open total hysterectomy, left salpingectomy, cystoscopy and small bowel resection (near distal ileum) secondary to bowel injury 3/8. Pt declined post operatively requiring another exploratory laparotomy, lysis of adhesions, abdominal wound washout, resection of previous anastomosis with a portion small bowel removed and subsequent reanastomosis 3/14, now  second anastamosis failure and revision 3/23  Patient Measurements: Height: 5\' 2"  (157.5 cm) Weight: 96.7 kg (213 lb 3 oz) IBW/kg (Calculated) : 50.1 TPN AdjBW (KG): 70.9 Body mass index is 38.99 kg/m.  Assessment: 44 y.o. female  s/p exploratory laparotomy and repair of small bowel enterotomy as well as hysterectomy with OB/GYN  Post-Op re-exploration abdominal washout and resection of original anastomosis x 2   Glucose / Insulin: BG 129 - 145, continue SSI q6h, 3 units/ 24 hrs  Electrolytes: WNL Renal: Scr <1, stable LFTs / TGs: TG 198-->166 - - -> 390>> 361 Prealbumin / albumin: 13.6/2.6 Intake / Output: I/O net 3/29 = -1070 ml  Central access:  07/30/19 TPN start date: 07/30/19  Nutritional Goals per RD recommendation  Kcal:  2832kcal/day Protein:  120g/day Goal TPN rate is 100 mL/hr (Regimen at goal rate will provide 2832kcal/day, 100g/day protein, 2760 ml volume )  IV Fluid: none  ABX:  Eraxis 3/19>>4/1 Zosyn 3/12 >> 3/24 Meropenem 3/24 >>  Current Nutrition:  Fluctuating  Plan:   Continue Clinimix 5/20 with electrolytes @ 100 ml/hr  Continue MVI on Mondays and Thursdays, trace elements daily  Continue 20% lipids @ 74ml/hr x 12 hrs/day   Continue sensitive SSI q6h and adjust as needed   Monitor TPN labs on Mon/Thurs, per protocol (including TG's twice weekly)  Lu Duffel, PharmD, BCPS Clinical Pharmacist 08/15/2019 11:01 AM

## 2019-08-16 LAB — CBC WITH DIFFERENTIAL/PLATELET
Abs Immature Granulocytes: 0.14 10*3/uL — ABNORMAL HIGH (ref 0.00–0.07)
Basophils Absolute: 0.1 10*3/uL (ref 0.0–0.1)
Basophils Relative: 1 %
Eosinophils Absolute: 0.1 10*3/uL (ref 0.0–0.5)
Eosinophils Relative: 1 %
HCT: 32.7 % — ABNORMAL LOW (ref 36.0–46.0)
Hemoglobin: 10.4 g/dL — ABNORMAL LOW (ref 12.0–15.0)
Immature Granulocytes: 1 %
Lymphocytes Relative: 23 %
Lymphs Abs: 2.3 10*3/uL (ref 0.7–4.0)
MCH: 28.9 pg (ref 26.0–34.0)
MCHC: 31.8 g/dL (ref 30.0–36.0)
MCV: 90.8 fL (ref 80.0–100.0)
Monocytes Absolute: 0.6 10*3/uL (ref 0.1–1.0)
Monocytes Relative: 6 %
Neutro Abs: 6.5 10*3/uL (ref 1.7–7.7)
Neutrophils Relative %: 68 %
Platelets: 446 10*3/uL — ABNORMAL HIGH (ref 150–400)
RBC: 3.6 MIL/uL — ABNORMAL LOW (ref 3.87–5.11)
RDW: 14.8 % (ref 11.5–15.5)
WBC: 9.7 10*3/uL (ref 4.0–10.5)
nRBC: 0 % (ref 0.0–0.2)

## 2019-08-16 LAB — GLUCOSE, CAPILLARY
Glucose-Capillary: 113 mg/dL — ABNORMAL HIGH (ref 70–99)
Glucose-Capillary: 118 mg/dL — ABNORMAL HIGH (ref 70–99)
Glucose-Capillary: 121 mg/dL — ABNORMAL HIGH (ref 70–99)
Glucose-Capillary: 121 mg/dL — ABNORMAL HIGH (ref 70–99)
Glucose-Capillary: 121 mg/dL — ABNORMAL HIGH (ref 70–99)

## 2019-08-16 MED ORDER — FAT EMULSION PLANT BASED 20 % IV EMUL
360.0000 mL | INTRAVENOUS | Status: AC
  Administered 2019-08-16: 360 mL via INTRAVENOUS
  Filled 2019-08-16: qty 360

## 2019-08-16 MED ORDER — CLINIMIX E/DEXTROSE (5/20) 5 % IV SOLN
INTRAVENOUS | Status: AC
  Filled 2019-08-16: qty 400

## 2019-08-16 MED ORDER — TRACE MINERALS CU-MN-SE-ZN 300-55-60-3000 MCG/ML IV SOLN
100.0000 mL/h | INTRAVENOUS | Status: AC
  Filled 2019-08-16: qty 2000

## 2019-08-16 NOTE — Progress Notes (Signed)
PHARMACY - TOTAL PARENTERAL NUTRITION CONSULT NOTE   Indication:  s/p laparoscopic converted to open total hysterectomy, left salpingectomy, cystoscopy and small bowel resection (near distal ileum) secondary to bowel injury 3/8. Pt declined post operatively requiring another exploratory laparotomy, lysis of adhesions, abdominal wound washout, resection of previous anastomosis with a portion small bowel removed and subsequent reanastomosis 3/14, now  second anastamosis failure and revision 3/23  Patient Measurements: Height: 5\' 2"  (157.5 cm) Weight: 96.7 kg (213 lb 3 oz) IBW/kg (Calculated) : 50.1 TPN AdjBW (KG): 70.9 Body mass index is 38.99 kg/m.  Assessment: 44 y.o. female  s/p exploratory laparotomy and repair of small bowel enterotomy as well as hysterectomy with OB/GYN  Post-Op re-exploration abdominal washout and resection of original anastomosis x 2   Glucose / Insulin: BG 129 - 145, continue SSI q6h, 3 units/ 24 hrs  Electrolytes: WNL Renal: Scr <1, stable LFTs / TGs: TG 198-->166 - - -> 390>> 361 Prealbumin / albumin: 13.6/2.6 Intake / Output: I/O net 3/29 = +554.3 ml  Central access:  07/30/19 TPN start date: 07/30/19  Nutritional Goals per RD recommendation  Kcal:  2832kcal/day Protein:  120g/day Goal TPN rate is 100 mL/hr (Regimen at goal rate will provide 2832kcal/day, 100g/day protein, 2760 ml volume )  IV Fluid: none  ABX:  Eraxis 3/19>>4/1 Zosyn 3/12 >> 3/24 Meropenem 3/24 >>  Current Nutrition:  Fluctuating  Plan:   Continue Clinimix 5/20 with electrolytes @ 100 ml/hr  Continue MVI on Mondays and Thursdays, trace elements daily  Continue 20% lipids @ 56ml/hr x 12 hrs/day   Continue sensitive SSI q6h and adjust as needed   Monitor TPN labs on Mon/Thurs, per protocol (including TG's twice weekly)  Pearla Dubonnet, PharmD Clinical Pharmacist 08/16/2019 10:01 AM

## 2019-08-16 NOTE — Progress Notes (Signed)
Subjective:  CC: Karen Aguirre is a 44 y.o. female  Hospital stay day 25,  exploratory laparotomy and repair of small bowel enterotomy as well as hysterectomy with OB/GYN, repeat ex-lap and re-anastamosis for failed anastamosis  11 Days Post-Op  Reexploration abdominal washout, LOA, end ileostomy and drain placement.  HPI: Persistent Nausea with one episode of emesis again yestreday. No new complaints of pain.  ROS:  General: Denies weight loss, weight gain, fatigue, fevers, chills, and night sweats. Heart: Denies chest pain, palpitations, racing heart, irregular heartbeat, leg pain or swelling, and decreased activity tolerance. Respiratory: Denies breathing difficulty, shortness of breath, wheezing, cough, and sputum. GI: Denies change in appetite, heartburn, constipation, diarrhea, and blood in stool. GU: Denies difficulty urinating, pain with urinating, urgency, frequency, blood in urine.   Objective:   Temp:  [98.1 F (36.7 C)-98.5 F (36.9 C)] 98.5 F (36.9 C) (04/02 0423) Pulse Rate:  [97-117] 117 (04/02 0423) Resp:  [15-20] 20 (04/01 2130) BP: (124-143)/(76-89) 140/85 (04/02 0423) SpO2:  [97 %-98 %] 97 % (04/02 0423)     Height: 5\' 2"  (157.5 cm) Weight: 96.7 kg BMI (Calculated): 38.98   Intake/Output this shift:   Intake/Output Summary (Last 24 hours) at 08/16/2019 0954 Last data filed at 08/16/2019 [id number] Gross per 24 hour  Intake 2294.28 ml  Output 1740 ml  Net 554.28 ml   1.4L/24hrs ostomy output 69ml/24hrs IR drain Constitutional :  alert, cooperative, appears stated age and mild distress  Respiratory:  clear to auscultation bilaterally  Cardiovascular:  regular rate and rhythm  Gastrointestinal: Soft, minimal guarding around incision ostomy patches of pink.  bilious output in bag, increasing amount.  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  IR drain with same purulent output.  staples intact, but skin line inbetween still not healed, especially near  the inferior aspect. serosanguinous drainge around retention sutures resolved.  Skin: Cool and moist.   Psychiatric: Normal affect, non-agitated, not confused       LABS:  CMP Latest Ref Rng & Units 08/15/2019 08/13/2019 08/12/2019  Glucose 70 - 99 mg/dL 132(H) 122(H) 141(H)  BUN 6 - 20 mg/dL 12 12 12   Creatinine 0.44 - 1.00 mg/dL 0.50 0.58 0.53  Sodium 135 - 145 mmol/L 131(L) 131(L) 131(L)  Potassium 3.5 - 5.1 mmol/L 4.0 4.3 4.3  Chloride 98 - 111 mmol/L 97(L) 94(L) 95(L)  CO2 22 - 32 mmol/L 25 27 26   Calcium 8.9 - 10.3 mg/dL 8.7(L) 8.7(L) 8.4(L)  Total Protein 6.5 - 8.1 g/dL 8.4(H) 8.3(H) 8.3(H)  Total Bilirubin 0.3 - 1.2 mg/dL 0.2(L) 0.4 0.3  Alkaline Phos 38 - 126 U/L 212(H) 210(H) 218(H)  AST 15 - 41 U/L 28 33 33  ALT 0 - 44 U/L 30 36 40   CBC Latest Ref Rng & Units 08/16/2019 08/15/2019 08/14/2019  WBC 4.0 - 10.5 K/uL 9.7 8.1 8.6  Hemoglobin 12.0 - 15.0 g/dL 10.4(L) 9.7(L) 9.2(L)  Hematocrit 36.0 - 46.0 % 32.7(L) 30.3(L) 28.6(L)  Platelets 150 - 400 K/uL 446(H) 467(H) 523(H)    RADS: CLINICAL DATA:  Abdominal abscess or infection suspected, extended hospitalization, status post exploratory laparotomy and repair of small bowel enterotomy, hysterectomy, repeat laparotomy and small bowel reanastomosis  EXAM: CT ABDOMEN AND PELVIS WITH CONTRAST  TECHNIQUE: Multidetector CT imaging of the abdomen and pelvis was performed using the standard protocol following bolus administration of intravenous contrast.  CONTRAST:  178mL OMNIPAQUE IOHEXOL 300 MG/ML  SOLN  COMPARISON:  07/31/2019  FINDINGS: Lower chest: Bandlike  scarring or atelectasis of the included bilateral lung bases.  Hepatobiliary: No focal liver abnormality is seen. Status post cholecystectomy. No biliary dilatation.  Pancreas: Unremarkable. No pancreatic ductal dilatation or surrounding inflammatory changes.  Spleen: Normal in size without significant abnormality.  Adrenals/Urinary Tract: Adrenal  glands are unremarkable. Kidneys are normal, without renal calculi, solid lesion, or hydronephrosis. Bladder is unremarkable.  Stomach/Bowel: Stomach is within normal limits. Status post repeat midline laparotomy and midline end ileostomy. There is a flattened air and fluid collection in the ventral abdomen just inferior to the ostomy site measuring approximately 6.7 x 5.7 x 3.5 cm (series 2, image 60). Pigtail drainage catheter appears to be positioned in the right lateral inferior aspect of this collection. There is an additional, smaller air and fluid collection in the ventral abdomen superior to the ostomy, measuring approximately 3.9 x 1.5 x 1.9 cm, which does not clearly communicate with the inferior collection (series 2, image 45).  Vascular/Lymphatic: No significant vascular findings are present. No enlarged abdominal or pelvic lymph nodes.  Reproductive: Status post hysterectomy. Small volume of fluid with a small focus of air in the low pelvis (series 2, image 81)  Other: No abdominal wall hernia or abnormality. No abdominopelvic ascites.  Musculoskeletal: No acute or significant osseous findings.  IMPRESSION: 1. Status post repeat midline laparotomy and midline end ileostomy. There is a flattened air and fluid collection in the ventral abdomen just inferior to the ostomy site measuring approximately 6.7 x 5.7 x 3.5 cm, concerning for abscess.  2. Right abdominal approach percutaneous pigtail drainage catheter appears to be positioned in the right lateral inferior aspect of this collection.  3. There is an additional, smaller air and fluid collection in the ventral abdomen superior to the ostomy, measuring approximately 3.9 x 1.5 x 1.9 cm, which does not clearly communicate with the inferior collection  4. Status post hysterectomy. Small volume of fluid in the pelvis, not clearly loculated, with a small focus of air in the low pelvis, air focus likely  within the vaginal cuff.   Electronically Signed   By: Eddie Candle M.D.   On: 08/15/2019 15:08 Assessment:   exploratory laparotomy and repair of small bowel enterotomy as well as hysterectomy with OB/GYN, repeat ex-lap and re-anastamosis for failed anastamosis. Reexploration for dehiscence, second anastamosis failure, abdominal washout, LOA, end ileostomy and drain placement.  Still Persistent nausea and episode of emesis yeterday despite schedule regulan.  no leukocytosis, and thrombocytosis continues to improve.   Ostomy continues to be patent and functional, increasing output.    Inferior wound base clean with pink granulation tissue, continued purulent drainage, IR drain with minimal output again, but still purulent.  CT scan shows fluid collections as above but should be continuing to drain from IR drain as well as the open portion at inferior aspect.  Not sure if any additional intervention to drain further will be possible at this point.  Will monitor for now.    Will also confirm ostomy care and supplies, possible PT, and IR drain care home health orders arranged, TPN as well. Home likely Monday when all equipment arranged.  No further intervention, monitoring is offered at this point inhouse so can continue outpt monitoring if clinically remains stable over weekend.   All questions and concerns addressed at this time.

## 2019-08-16 NOTE — Progress Notes (Signed)
OT Cancellation Note  Patient Details Name: Karen Aguirre MRN: [id number] DOB: June 16, 1975   Cancelled Treatment:    Reason Eval/Treat Not Completed: Other (comment);Patient declined, no reason specified   Approached patient in AM with PT.  Patient states she is feeling too nauseas to participate and declined any self care or OOB activity.  Will follow up as available and appropriate.  Thank you.  [name] [name] 08/16/2019, 10:04 AM

## 2019-08-16 NOTE — Progress Notes (Signed)
ID  Still has nausea  Patient Vitals for the past 24 hrs:  BP Temp Temp src Pulse Resp SpO2  08/16/19 1247 (!) 155/83 99.5 F (37.5 C) Oral 97 18 97 %  08/16/19 1020 (!) 145/78 97.9 F (36.6 C) Oral (!) 102 18 97 %  08/16/19 0423 140/85 98.5 F (36.9 C) Oral (!) 117 -- 97 %  08/15/19 2130 (!) 143/89 98.1 F (36.7 C) Oral 99 20 98 %        CBC Latest Ref Rng & Units 08/16/2019 08/15/2019 08/14/2019  WBC 4.0 - 10.5 K/uL 9.7 8.1 8.6  Hemoglobin 12.0 - 15.0 g/dL 10.4(L) 9.7(L) 9.2(L)  Hematocrit 36.0 - 46.0 % 32.7(L) 30.3(L) 28.6(L)  Platelets 150 - 400 K/uL 446(H) 467(H) 523(H)    CMP Latest Ref Rng & Units 08/15/2019 08/13/2019 08/12/2019  Glucose 70 - 99 mg/dL 132(H) 122(H) 141(H)  BUN 6 - 20 mg/dL 12 12 12   Creatinine 0.44 - 1.00 mg/dL 0.50 0.58 0.53  Sodium 135 - 145 mmol/L 131(L) 131(L) 131(L)  Potassium 3.5 - 5.1 mmol/L 4.0 4.3 4.3  Chloride 98 - 111 mmol/L 97(L) 94(L) 95(L)  CO2 22 - 32 mmol/L 25 27 26   Calcium 8.9 - 10.3 mg/dL 8.7(L) 8.7(L) 8.4(L)  Total Protein 6.5 - 8.1 g/dL 8.4(H) 8.3(H) 8.3(H)  Total Bilirubin 0.3 - 1.2 mg/dL 0.2(L) 0.4 0.3  Alkaline Phos 38 - 126 U/L 212(H) 210(H) 218(H)  AST 15 - 41 U/L 28 33 33  ALT 0 - 44 U/L 30 36 40    Recommendation Intra-abdominal abscess due to E.coli and yeast  following complications from Fibroid surgery Completed 10 days of IV meropenem- leucocytosis has resolved since 3/30 Finished 14 days of anidulafungin Will DC meropenem today  Discussed with Dr.Sakai and patient and her husband

## 2019-08-16 NOTE — Progress Notes (Signed)
PT Cancellation Note  Patient Details Name: Karen Aguirre MRN: [id number] DOB: [birth year]   Cancelled Treatment:    Reason Eval/Treat Not Completed: Other (comment). Patient reported continued nausea this AM, politely declined PT/OT. Encouraged to mobilize as able during the day. PT to follow up as able.  Lieutenant Diego PT, DPT 10:03 AM,08/16/19

## 2019-08-17 LAB — GLUCOSE, CAPILLARY
Glucose-Capillary: 124 mg/dL — ABNORMAL HIGH (ref 70–99)
Glucose-Capillary: 125 mg/dL — ABNORMAL HIGH (ref 70–99)
Glucose-Capillary: 127 mg/dL — ABNORMAL HIGH (ref 70–99)
Glucose-Capillary: 128 mg/dL — ABNORMAL HIGH (ref 70–99)

## 2019-08-17 LAB — CBC WITH DIFFERENTIAL/PLATELET
Abs Immature Granulocytes: 0.12 10*3/uL — ABNORMAL HIGH (ref 0.00–0.07)
Basophils Absolute: 0.1 10*3/uL (ref 0.0–0.1)
Basophils Relative: 1 %
Eosinophils Absolute: 0.3 10*3/uL (ref 0.0–0.5)
Eosinophils Relative: 3 %
HCT: 32.8 % — ABNORMAL LOW (ref 36.0–46.0)
Hemoglobin: 10.4 g/dL — ABNORMAL LOW (ref 12.0–15.0)
Immature Granulocytes: 1 %
Lymphocytes Relative: 25 %
Lymphs Abs: 2.5 10*3/uL (ref 0.7–4.0)
MCH: 29.1 pg (ref 26.0–34.0)
MCHC: 31.7 g/dL (ref 30.0–36.0)
MCV: 91.9 fL (ref 80.0–100.0)
Monocytes Absolute: 0.8 10*3/uL (ref 0.1–1.0)
Monocytes Relative: 8 %
Neutro Abs: 6.2 10*3/uL (ref 1.7–7.7)
Neutrophils Relative %: 62 %
Platelets: 393 10*3/uL (ref 150–400)
RBC: 3.57 MIL/uL — ABNORMAL LOW (ref 3.87–5.11)
RDW: 14.9 % (ref 11.5–15.5)
WBC: 9.9 10*3/uL (ref 4.0–10.5)
nRBC: 0 % (ref 0.0–0.2)

## 2019-08-17 LAB — TSH: TSH: 1.914 u[IU]/mL (ref 0.350–4.500)

## 2019-08-17 MED ORDER — FAT EMULSION PLANT BASED 20 % IV EMUL
360.0000 mL | INTRAVENOUS | Status: AC
  Administered 2019-08-17: 360 mL via INTRAVENOUS
  Filled 2019-08-17: qty 360

## 2019-08-17 MED ORDER — TRACE MINERALS CU-MN-SE-ZN 300-55-60-3000 MCG/ML IV SOLN
100.0000 mL/h | INTRAVENOUS | Status: AC
  Filled 2019-08-17: qty 2000

## 2019-08-17 MED ORDER — CLINIMIX E/DEXTROSE (5/20) 5 % IV SOLN
INTRAVENOUS | Status: AC
  Filled 2019-08-17: qty 400

## 2019-08-17 NOTE — Progress Notes (Signed)
[facility] Day(s): 26.   Post op day(s): 12 Days Post-Op.   Interval History: Patient seen and examined.  This morning the patient reported that she is feeling a little bit better.  She endorses that yesterday was an awful day with significant nausea and vomiting.  This morning without nausea.  She has not eat or drink anything so far.  She is still passing a great amount of stool through the ostomy.  To report that the Reglan and Phenergan are not working for controlling the nausea.  Reports no change in her usual abdominal pain.  Vital signs in last 24 hours: [min-max] current  Temp:  [97.9 F (36.6 C)-99.5 F (37.5 C)] 98.1 F (36.7 C) (04/03 0554) Pulse Rate:  [97-110] 110 (04/03 0554) Resp:  [16-18] 16 (04/03 0554) BP: (117-155)/(73-83) 117/73 (04/03 0554) SpO2:  [97 %-98 %] 97 % (04/03 0554)     Height: 5\' 2"  (157.5 cm) Weight: 96.7 kg BMI (Calculated): 38.98   Ileostomy:  1,000 mL in last 24 hours   Physical Exam:  Constitutional: alert, cooperative and no distress  Respiratory: breathing non-labored at rest  Cardiovascular: regular rate and sinus rhythm  Gastrointestinal: soft, mild-tender, and non-distended.  Midline wound with seropurulent output.  Otherwise the wound looks clean and healing slowly.  Ostomy pink and patent  Labs:  CBC Latest Ref Rng & Units 08/17/2019 08/16/2019 08/15/2019  WBC 4.0 - 10.5 K/uL 9.9 9.7 8.1  Hemoglobin 12.0 - 15.0 g/dL 10.4(L) 10.4(L) 9.7(L)  Hematocrit 36.0 - 46.0 % 32.8(L) 32.7(L) 30.3(L)  Platelets 150 - 400 K/uL 393 446(H) 467(H)   CMP Latest Ref Rng & Units 08/15/2019 08/13/2019 08/12/2019  Glucose 70 - 99 mg/dL 132(H) 122(H) 141(H)  BUN 6 - 20 mg/dL 12 12 12   Creatinine 0.44 - 1.00 mg/dL 0.50 0.58 0.53  Sodium 135 - 145 mmol/L 131(L) 131(L) 131(L)  Potassium 3.5 - 5.1 mmol/L 4.0 4.3 4.3  Chloride 98 - 111 mmol/L 97(L) 94(L) 95(L)  CO2 22 - 32 mmol/L 25 27 26   Calcium 8.9 - 10.3 mg/dL 8.7(L) 8.7(L) 8.4(L)  Total  Protein 6.5 - 8.1 g/dL 8.4(H) 8.3(H) 8.3(H)  Total Bilirubin 0.3 - 1.2 mg/dL 0.2(L) 0.4 0.3  Alkaline Phos 38 - 126 U/L 212(H) 210(H) 218(H)  AST 15 - 41 U/L 28 33 33  ALT 0 - 44 U/L 30 36 40    Imaging studies: No new pertinent imaging studies   Assessment/Plan:  44 year old M meter initially for hysterectomy due to pelvic pain and fibroid uterus.  Surgery was complicated with bowel injury.  At the moment a bowel resection with anastomosis was done.  She had another complication of anastomosis leak that needed to be taken back to the OR.  Another bowel resection and anastomosis was done.  Patient developed a recurrent late was taken back to the OR for abdominal washout and ileostomy creation and drain placement.  Since then the patient has been recovering very slowly but has persistent nausea and vomiting.  Last CT scan does not show any new fluid collection or anything that can explain her persistent nausea or vomiting.  The patient continued to have adequate output through the ileostomy.  My thoughts are that this is an intermittent functional issue and not an actual obstruction.  This is a very difficult situation for the patient because he is very uncomfortable for her since the Reglan and Phenergan are not helping to control his nausea and vomiting.  At this  moment there is no further images or medication that can help the patient.  Patient will continue with TPN.  I encouraged the patient to ambulate.  We will continue to support the patient to make her as comfortable as possible.  I cleaned the wound and is healing adequately with some drainage controlled with dressing.  We will continue with DVT prophylaxis.  Arnold Long, MD

## 2019-08-17 NOTE — Progress Notes (Signed)
OT Cancellation Note  Patient Details Name: MIKAYLAH MALKI MRN: [id number] DOB: 11/18/1975   Cancelled Treatment:    Reason Eval/Treat Not Completed: Patient declined, no reason specified. Spoke with pt and pt's mother. Pt declining 2/2 significant nausea. Agreeable to re-attempt at later date.   Jeni Salles, MPH, MS, OTR/L ascom [phone] 08/17/19, 12:35 PM

## 2019-08-17 NOTE — Progress Notes (Signed)
PHARMACY - TOTAL PARENTERAL NUTRITION CONSULT NOTE   Indication:  s/p laparoscopic converted to open total hysterectomy, left salpingectomy, cystoscopy and small bowel resection (near distal ileum) secondary to bowel injury 3/8. Pt declined post operatively requiring another exploratory laparotomy, lysis of adhesions, abdominal wound washout, resection of previous anastomosis with a portion small bowel removed and subsequent reanastomosis 3/14, now  second anastamosis failure and revision 3/23  Patient Measurements: Height: 5\' 2"  (157.5 cm) Weight: 96.7 kg (213 lb 3 oz) IBW/kg (Calculated) : 50.1 TPN AdjBW (KG): 70.9 Body mass index is 38.99 kg/m.  Assessment: 44 y.o. female  s/p exploratory laparotomy and repair of small bowel enterotomy as well as hysterectomy with OB/GYN  Post-Op re-exploration abdominal washout and resection of original anastomosis x 2   Glucose / Insulin: BG 113-128, continue SSI q6h, 1 unit/ 24 hrs  Electrolytes: Renal:  LFTs / TGs: TG 198-->166 - - -> 390>> 361 Prealbumin / albumin: 13.6/2.6 Intake / Output: I/O net 3/29 = +554.3 ml  Central access:  07/30/19 TPN start date: 07/30/19  Nutritional Goals per RD recommendation  Kcal:  2832kcal/day Protein:  120g/day Goal TPN rate is 100 mL/hr (Regimen at goal rate will provide 2832kcal/day, 100g/day protein, 2760 ml volume )  IV Fluid: none  ABX:  Eraxis 3/19>>4/1 Zosyn 3/12 >> 3/24 Meropenem 3/24 >> 4/2  Current Nutrition:  Fluctuating  Plan:   Continue Clinimix 5/20 with electrolytes @ 100 ml/hr  Continue MVI on Mondays and Thursdays, trace elements daily  Continue 20% lipids @ 24ml/hr x 12 hrs/day   Continue sensitive SSI q6h and adjust as needed   Monitor TPN labs on Mon/Thurs, per protocol (including TG's twice weekly)  Noralee Space, PharmD Clinical Pharmacist 08/17/2019 10:40 AM

## 2019-08-17 NOTE — Progress Notes (Signed)
PT Cancellation Note  Patient Details Name: Karen Aguirre MRN: [id number] DOB: 25-Jul-1975   Cancelled Treatment:     PT attempt. Pt politely refused stating she continues to have nausea and is too fatigued to participate at this time.  PT will continue to follow per POC.    Willette Pa 08/17/2019, 1:32 PM

## 2019-08-18 LAB — GLUCOSE, CAPILLARY
Glucose-Capillary: 124 mg/dL — ABNORMAL HIGH (ref 70–99)
Glucose-Capillary: 133 mg/dL — ABNORMAL HIGH (ref 70–99)
Glucose-Capillary: 115 mg/dL — ABNORMAL HIGH (ref 70–99)

## 2019-08-18 MED ORDER — TRACE MINERALS CU-MN-SE-ZN 300-55-60-3000 MCG/ML IV SOLN
100.0000 mL/h | INTRAVENOUS | Status: DC
  Filled 2019-08-18: qty 2000

## 2019-08-18 MED ORDER — FAT EMULSION PLANT BASED 20 % IV EMUL
360.0000 mL | INTRAVENOUS | Status: AC
  Administered 2019-08-18: 360 mL via INTRAVENOUS
  Filled 2019-08-18: qty 360

## 2019-08-18 MED ORDER — CLINIMIX E/DEXTROSE (5/20) 5 % IV SOLN
INTRAVENOUS | Status: DC
  Filled 2019-08-18: qty 400

## 2019-08-18 NOTE — Progress Notes (Signed)
PHARMACY - TOTAL PARENTERAL NUTRITION CONSULT NOTE   Indication:  s/p laparoscopic converted to open total hysterectomy, left salpingectomy, cystoscopy and small bowel resection (near distal ileum) secondary to bowel injury 3/8. Pt declined post operatively requiring another exploratory laparotomy, lysis of adhesions, abdominal wound washout, resection of previous anastomosis with a portion small bowel removed and subsequent reanastomosis 3/14, now  second anastamosis failure and revision 3/23  Patient Measurements: Height: 5\' 2"  (157.5 cm) Weight: 95.7 kg (210 lb 15.7 oz) IBW/kg (Calculated) : 50.1 TPN AdjBW (KG): 70.9 Body mass index is 38.59 kg/m.  Assessment: 44 y.o. female  s/p exploratory laparotomy and repair of small bowel enterotomy as well as hysterectomy with OB/GYN  Post-Op re-exploration abdominal washout and resection of original anastomosis x 2   Glucose / Insulin: BG 124-133, continue SSI q6h, 4 unit/ 24 hrs  Electrolytes: Renal:  LFTs / TGs: TG 198-->166 - - -> 390>> 361 Prealbumin / albumin: 13.6/2.6 Intake / Output: I/O net 3/29 = +554.3 ml  Central access:  07/30/19 TPN start date: 07/30/19  Nutritional Goals per RD recommendation  Kcal:  2832kcal/day Protein:  120g/day Goal TPN rate is 100 mL/hr (Regimen at goal rate will provide 2832kcal/day, 100g/day protein, 2760 ml volume )  IV Fluid: none  ABX:  Eraxis 3/19>>4/1 Zosyn 3/12 >> 3/24 Meropenem 3/24 >> 4/2  Current Nutrition:  Fluctuating  Plan:   Continue Clinimix 5/20 with electrolytes @ 100 ml/hr  Continue MVI on Mondays and Thursdays, trace elements daily  Continue 20% lipids @ 7ml/hr x 12 hrs/day   Continue sensitive SSI q6h and adjust as needed   Monitor TPN labs on Mon/Thurs, per protocol (including TG's twice weekly)  Noralee Space, PharmD Clinical Pharmacist 08/18/2019 12:18 PM

## 2019-08-18 NOTE — Progress Notes (Signed)
Physical Therapy Treatment Patient Details Name: Karen Aguirre MRN: [id number] DOB: May 23, 1975 Today's Date: 08/18/2019    History of Present Illness Pt is a 44 y.o. female with attempted robotic hysterectomy causing SB perforation leading to repair on 3/8, leak and exploratory laparotomy on 3/14, pt experienced dehiscence and underwent exploratory laparotmoy and ileostomy on 08/06/19.    PT Comments    Pt received resting in bed with mom present. Pt agreeable to attempt therapy and get up however reports some nausea. Nursing in but unable to give any more medication at this time. Pt performed supine LE therex with min cuing for correct performance and no physical assist required. Nursing also in to empty ostomy bag once sitting EOB. Pt required supervision assist to get EOB with pt utilizing bedrails and increased time. Once EOB pt reporting dizziness and increased nausea. Pt tolerated sitting EOB several minutes and then performed LE therex. Pt required min guard A to stand x2 trials with RW for added stability. First trial, pt tolerate standing ~2 min performing LE therex including standing marching and forward and lateral toe touches before requesting to sit down due to fatigue, dizziness and nausea. Second trial standing, pt only able to tolerate ~30 sec static standing before needed to return to bed. Pt limited this session due to increased dizziness, nausea and fatigue. Pt able to independently scoot and reposition in bed with heavy use of bed rails. Pt encouraged to continue performed supine and seated LE therex as able and encouraged to sit up in recliner more during the day and gradually increasing activity tolerance. Pt will benefit from cont acute therapy to improve noted deficits in order to optimize return to PLOF. HHPT remains appropriate.     Follow Up Recommendations  Home health PT;Supervision for mobility/OOB     Equipment Recommendations  Rolling walker with 5" wheels     Recommendations for Other Services       Precautions / Restrictions Precautions Precautions: Fall Precaution Comments: abdominal surgeries Restrictions Weight Bearing Restrictions: No    Mobility  Bed Mobility Overal bed mobility: Needs Assistance Bed Mobility: Supine to Sit;Sit to Supine     Supine to sit: Supervision;HOB elevated Sit to supine: Supervision;HOB elevated   General bed mobility comments: supervision to get EOB and return to supine, no physical assist required, pt utilizing bed rails, increased time  Transfers Overall transfer level: Needs assistance Equipment used: Rolling walker (2 wheeled) Transfers: Sit to/from Stand Sit to Stand: Min guard         General transfer comment: min guard for safety, safe STS from bed x2 trials, complaints of increased dizziness and nausea when sitting or standing  Ambulation/Gait             General Gait Details: deferred due to increased dizziness and nausea   Stairs             Wheelchair Mobility    Modified Rankin (Stroke Patients Only)       Balance Overall balance assessment: Needs assistance Sitting-balance support: Feet supported Sitting balance-Leahy Scale: Fair Sitting balance - Comments: pt prefers at least unilateral support in sitting   Standing balance support: Bilateral upper extremity supported;During functional activity Standing balance-Leahy Scale: Fair Standing balance comment: pt reliant on UE support at this time secondary to abdominal pain and increased dizziness                            Cognition  Arousal/Alertness: Awake/alert Behavior During Therapy: WFL for tasks assessed/performed Overall Cognitive Status: Within Functional Limits for tasks assessed                                        Exercises Total Joint Exercises Ankle Circles/Pumps: AROM;Both;20 reps Heel Slides: AROM;Both;10 reps Hip ABduction/ADduction: AROM;Both;10  reps Straight Leg Raises: AROM;Both;10 reps Long Arc Quad: AROM;Both;10 reps Marching in Standing: AROM;Both;10 reps;Seated;Standing Other Exercises Other Exercises: standing toe taps forward and lateral bil    General Comments General comments (skin integrity, edema, etc.): VSS, nursing in to empty ostomy bag      Pertinent Vitals/Pain Pain Assessment: Faces Faces Pain Scale: Hurts even more Pain Location: abdominal pain, back pain Pain Descriptors / Indicators: Constant;Discomfort Pain Intervention(s): Limited activity within patient's tolerance;Monitored during session;Repositioned    Home Living                      Prior Function            PT Goals (current goals can now be found in the care plan section) Progress towards PT goals: Progressing toward goals    Frequency    Min 2X/week      PT Plan Current plan remains appropriate    Co-evaluation              AM-PAC PT "6 Clicks" Mobility   Outcome Measure  Help needed turning from your back to your side while in a flat bed without using bedrails?: A Little Help needed moving from lying on your back to sitting on the side of a flat bed without using bedrails?: A Little Help needed moving to and from a bed to a chair (including a wheelchair)?: A Little Help needed standing up from a chair using your arms (e.g., wheelchair or bedside chair)?: A Little Help needed to walk in hospital room?: A Little Help needed climbing 3-5 steps with a railing? : A Lot 6 Click Score: 17    End of Session   Activity Tolerance: Patient limited by pain;Other (comment)(nausea and dizziness) Patient left: in chair;with family/visitor present;with call bell/phone within reach Nurse Communication: Mobility status PT Visit Diagnosis: Other abnormalities of gait and mobility (R26.89);Muscle weakness (generalized) (M62.81);Difficulty in walking, not elsewhere classified (R26.2)     Time: [id number] PT Time  Calculation (min) (ACUTE ONLY): 25 min  Charges:  $Therapeutic Activity: 23-37 mins                     Karen Aguirre PT, DPT 2:30 PM,08/18/19 [phone]    Karen Aguirre 08/18/2019, 2:25 PM

## 2019-08-18 NOTE — Progress Notes (Signed)
[place] Hospital Day(s): 27.   Post op day(s): 13 Days Post-Op.   Interval History: Patient seen and examined, no acute events or new complaints overnight. Patient reports had an episode of nausea and vomiting yesterday evening.  Again this morning seems to feel better but this problem seems to be intermittent.  She tried to 8 hashbrowns which caused her nausea.  Otherwise her pain seems to be stable.  Reports she was able to sit on a chair yesterday.  Vital signs in last 24 hours: [min-max] current  Temp:  [98 F (36.7 C)-98.1 F (36.7 C)] 98 F (36.7 C) (04/04 0509) Pulse Rate:  [102-106] 104 (04/04 0509) Resp:  [16-20] 16 (04/04 0509) BP: (126-148)/(78-80) 126/78 (04/04 0509) SpO2:  [97 %-98 %] 97 % (04/04 0509) Weight:  [95.7 kg] 95.7 kg (04/04 0506)     Height: 5\' 2"  (157.5 cm) Weight: 95.7 kg BMI (Calculated): 38.58   Ileostomy: 775 mL in 24 hours  Physical Exam:  Constitutional: alert, cooperative and no distress  Respiratory: breathing non-labored at rest  Cardiovascular: regular rate and sinus rhythm  Gastrointestinal: soft, non-tender, and non-distended.  Midline wound clean with inferior portion opening with seropurulent output.  Ileostomy patent.  Labs:  CBC Latest Ref Rng & Units 08/17/2019 08/16/2019 08/15/2019  WBC 4.0 - 10.5 K/uL 9.9 9.7 8.1  Hemoglobin 12.0 - 15.0 g/dL 10.4(L) 10.4(L) 9.7(L)  Hematocrit 36.0 - 46.0 % 32.8(L) 32.7(L) 30.3(L)  Platelets 150 - 400 K/uL 393 446(H) 467(H)   CMP Latest Ref Rng & Units 08/15/2019 08/13/2019 08/12/2019  Glucose 70 - 99 mg/dL 132(H) 122(H) 141(H)  BUN 6 - 20 mg/dL 12 12 12   Creatinine 0.44 - 1.00 mg/dL 0.50 0.58 0.53  Sodium 135 - 145 mmol/L 131(L) 131(L) 131(L)  Potassium 3.5 - 5.1 mmol/L 4.0 4.3 4.3  Chloride 98 - 111 mmol/L 97(L) 94(L) 95(L)  CO2 22 - 32 mmol/L 25 27 26   Calcium 8.9 - 10.3 mg/dL 8.7(L) 8.7(L) 8.4(L)  Total Protein 6.5 - 8.1 g/dL 8.4(H) 8.3(H) 8.3(H)  Total Bilirubin 0.3 - 1.2 mg/dL  0.2(L) 0.4 0.3  Alkaline Phos 38 - 126 U/L 212(H) 210(H) 218(H)  AST 15 - 41 U/L 28 33 33  ALT 0 - 44 U/L 30 36 40    Imaging studies: No new pertinent imaging studies   Assessment/Plan:  44 year old M meter initially for hysterectomy due to pelvic pain and fibroid uterus.  Surgery was complicated with bowel injury.  At the moment a bowel resection with anastomosis was done.  She had another complication of anastomosis leak that needed to be taken back to the OR.  Another bowel resection and anastomosis was done.  Patient developed a recurrent late was taken back to the OR for abdominal washout and ileostomy creation and drain placement.  Today there has been no clinical change.  Patient continues with intermittent nausea and vomiting.  When is not nauseous she feels comfortable but when she tried to eat something she gets nauseous.  She is on TPN.  She is able to get out of bed.  There has been no deterioration of her condition.  We will continue with current management.  Patient is eager to go home with TPN.  Will review tomorrow to make sure that she has everything that she needs at home to maintain adequate hydration, nutrition, nausea medications, pain medications and any other assistance that she needs.  Arnold Long, MD

## 2019-08-19 LAB — COMPREHENSIVE METABOLIC PANEL
ALT: 49 U/L — ABNORMAL HIGH (ref 0–44)
AST: 49 U/L — ABNORMAL HIGH (ref 15–41)
Albumin: 3.3 g/dL — ABNORMAL LOW (ref 3.5–5.0)
Alkaline Phosphatase: 254 U/L — ABNORMAL HIGH (ref 38–126)
Anion gap: 8 (ref 5–15)
BUN: 16 mg/dL (ref 6–20)
CO2: 27 mmol/L (ref 22–32)
Calcium: 9.4 mg/dL (ref 8.9–10.3)
Chloride: 94 mmol/L — ABNORMAL LOW (ref 98–111)
Creatinine, Ser: 0.57 mg/dL (ref 0.44–1.00)
GFR calc Af Amer: >60 mL/min (ref >60)
GFR calc non Af Amer: >60 mL/min (ref >60)
Glucose, Bld: 101 mg/dL — ABNORMAL HIGH (ref 70–99)
Potassium: 4.7 mmol/L (ref 3.5–5.1)
Sodium: 129 mmol/L — ABNORMAL LOW (ref 135–145)
Total Bilirubin: 0.5 mg/dL (ref 0.3–1.2)
Total Protein: 9.1 g/dL — ABNORMAL HIGH (ref 6.5–8.1)

## 2019-08-19 LAB — DIFFERENTIAL
Abs Immature Granulocytes: 0.08 10*3/uL — ABNORMAL HIGH (ref 0.00–0.07)
Basophils Absolute: 0.1 10*3/uL (ref 0.0–0.1)
Basophils Relative: 1 %
Eosinophils Absolute: 0.2 10*3/uL (ref 0.0–0.5)
Eosinophils Relative: 2 %
Immature Granulocytes: 1 %
Lymphocytes Relative: 33 %
Lymphs Abs: 2.9 10*3/uL (ref 0.7–4.0)
Monocytes Absolute: 0.8 10*3/uL (ref 0.1–1.0)
Monocytes Relative: 9 %
Neutro Abs: 4.8 10*3/uL (ref 1.7–7.7)
Neutrophils Relative %: 54 %

## 2019-08-19 LAB — CBC
HCT: 33.7 % — ABNORMAL LOW (ref 36.0–46.0)
Hemoglobin: 11.1 g/dL — ABNORMAL LOW (ref 12.0–15.0)
MCH: 29.3 pg (ref 26.0–34.0)
MCHC: 32.9 g/dL (ref 30.0–36.0)
MCV: 88.9 fL (ref 80.0–100.0)
Platelets: 397 10*3/uL (ref 150–400)
RBC: 3.79 MIL/uL — ABNORMAL LOW (ref 3.87–5.11)
RDW: 14.8 % (ref 11.5–15.5)
WBC: 8.9 10*3/uL (ref 4.0–10.5)
nRBC: 0 % (ref 0.0–0.2)

## 2019-08-19 LAB — PREALBUMIN: Prealbumin: 39.8 mg/dL — ABNORMAL HIGH (ref 18–38)

## 2019-08-19 LAB — TRIGLYCERIDES: Triglycerides: 365 mg/dL — ABNORMAL HIGH (ref <150)

## 2019-08-19 LAB — PHOSPHORUS: Phosphorus: 4.9 mg/dL — ABNORMAL HIGH (ref 2.5–4.6)

## 2019-08-19 LAB — MAGNESIUM: Magnesium: 2.1 mg/dL (ref 1.7–2.4)

## 2019-08-19 MED ORDER — METOCLOPRAMIDE HCL 5 MG PO TABS: 5.0000 mg | ORAL_TABLET | Freq: Three times a day (TID) | ORAL | 0 refills | Status: AC | PRN

## 2019-08-19 MED ORDER — FAT EMULSION PLANT BASED 20 % IV EMUL: 360.0000 mL | INTRAVENOUS | 10 refills | Status: AC

## 2019-08-19 MED ORDER — ENSURE ENLIVE PO LIQD: 237.0000 mL | Freq: Three times a day (TID) | ORAL | 12 refills | Status: AC

## 2019-08-19 MED ORDER — ONDANSETRON 4 MG PO TBDP: 4.0000 mg | ORAL_TABLET | Freq: Three times a day (TID) | ORAL | 0 refills | Status: AC | PRN

## 2019-08-19 NOTE — Progress Notes (Signed)
Nutrition Brief Note    Karen Aguirre TPN Regimen  Clinimix E 5/20 at 100 ml/hr over 24 hours per day   2400 ml daily 120 grams of protein daily 480 grams of dextrose daily  20% ILE at 30 ml/hr over 12 hours per day 360 ml daily (72g/day)  Total regimen provides 2832 kcal and 120 grams of protein per day  Additives: 10 ml multivitamins adult 1 ml trace elements (MTE-5 Concentrate)   Estimated Nutritional Needs:   Kcal:  2400-2700kcal/day Protein:  120-135g/day Fluid:  1.8L/day   Labs from 4/5  Na- 129(L) mmol/L K- 4.7 mmol/L P- 4.9(H) mg/dL Mg- 2.1 mg/dL Prealbumin- 39.8(L) mg/dL Triglycerides- 365 (H) mg/dL cbgs- 124, 133, 115 x 24 hrs  [name] MS, [place], LDN Office- [phone] Pager- [phone]

## 2019-08-19 NOTE — TOC Transition Note (Signed)
Transition of Care [facility]) - CM/SW Discharge Note   Patient Details  Name: MYKELLE COCKERELL MRN: [id number] Date of Birth: 1975-07-04  [facility] of [facility]) CM/SW Contact:  Beverly Sessions, RN Phone Number: 08/19/2019, 1:20 PM   Clinical Narrative:     Patient to discharge home today Corene Cornea with [facility] notified.   Corene Cornea with [place] and Pam with [facility] has met with the patient at bedside.  Pam provided education with patient and Husband on Friday   Per Brad with [facility] RW has been delivered to room.    Final next level of care: [place] Barriers to Discharge: No Barriers Identified   Patient Goals and CMS Choice     Choice offered to / list presented to : Patient  Discharge Placement                       Discharge Plan and Services                DME Arranged: Walker rolling DME Agency: [facility] Date DME Agency Contacted: 08/19/19   Representative spoke with at DME Agency: [place]: RN, PT [facility] Agency: [place] ([place]) Date [place]: 08/19/19   Representative spoke with at [place]: [place] ([place]) Interventions     Readmission Risk Interventions No flowsheet data found.

## 2019-08-19 NOTE — Discharge Instructions (Signed)
Discharge instructions:  Call office if you have any of the following: fever >101 F, chills, shortness of breath, excessive vaginal bleeding, incision drainage or problems, leg pain or redness, or any other concerns.   Activity: Do not lift > 20 lbs for 8 weeks.  No intercourse or tampons for 8 weeks.  No driving until you are certain you can slam on the brakes, and of course never while taking narcotics.   Keep record of daily output from drain and bring to next appt  Dressings will be changed daily as needed. No need to pack. Flush drain twice daily with 11ml.

## 2019-08-19 NOTE — Progress Notes (Signed)
Labs are reviewed. Hemoglobin has improved. Thrombocytosis completely resolved. Hemonc will sign off. Discussed with Dr.Sakai.

## 2019-08-19 NOTE — Plan of Care (Signed)
Discharge order received. Patient mental status is at baseline. Vital signs stable . No signs of acute distress. Discharge instructions given. Patient verbalized understanding. No other issues noted at this time.   

## 2019-08-19 NOTE — Consult Note (Signed)
[place] Nurse ostomy follow-up consultnote Stoma type/location:Midline abdominal end ileostomy with retention sutures present superior and distal to the stoma. Area stays frequently moist from wounds and drains.  Stomal assessment/size:90% loose brown slough, 10% red, slightly above skin level, 1 3/4 inches Peristomal assessment:retention sutures above and below create a difficult pouching situation. Output: mod amtliquid brown stool Ostomy pouching: 1pc.convex with barrier ring.  Education provided:Demonstrated pouch change to patient and mother, Pt will not look at the stoma and is nauseated and vomiting during the procedure.  Mother was able to apply pouch and barrier ring with assistance and patient was able to open and close to empty. Discussed ordering supplies and pouching routines.  Reviewed dietary precautions and avoiding dehydration again.  4 sets of barrier rings and pouches left at the bedside for use after discharge.  Pt could benefit from home health assistance after discharge.   Enrolled patient in [place] program: Yes Julien Girt MSN, RN, [place], [place], [place]

## 2019-08-20 NOTE — Discharge Summary (Signed)
Physician Discharge Summary  Patient ID: Karen Aguirre MRN: [id number] DOB/AGE: 08/05/1975 44 y.o.  Admit date: 07/22/2019 Discharge date: 08/20/2019  Admission Diagnoses: fibroid uterus  Discharge Diagnoses:  Same as above, with bowel perforation, abdominal wall abscess, thrombocytosis  Discharged Condition: fair  Hospital Course: Patient was noted to have a bowel injury that required bowel resection and anastomosis.  Postop, patient noted to become increasing tachycardic with persistent nausea vomiting along with an increasing white count.  This is despite a postoperative CT scan showing an abscess that was drained by an IR guided drain.  Decision was made to take her back for an exploratory laparotomy where she was noted to have a anastomotic breakdown with extensive abscess formation within her abdominal cavity.  The abdomen was washed out and a new anastomosis was created due to the fact that her bowels were extremely inflamed and scarred down to the point where a ileostomy through her thick abdominal wall would have been impossible.  Postop, she continued to not tolerate oral intake and eventually succus was noted to be seeping from her midline wound. Thrombocytosis was noted during this time so oncology was consulted and determined to be reactive in nature.  Persistent leukocytosis as well so ID consulted for abx management.  She was taken back a second time were it was noted that her anastomosis has broken down yet again.  However, less scar tissue formation was noted and dissection was safely able to be carried out enough to the point where the end ileostomy was able to be brought up through her midline, and a drain for the distal segment was placed through the midline.  Please see op notes for further details.  Postoperatively she slowly recovered while on TPN, where her tachycardia and lab values eventually normalized.  Pain has resolved as well.  Despite this, patient continued to have  persistent nausea and vomiting despite multiple changes in her medication regimen as well as antinausea medication.  She otherwise was clinically stable with adequate ostomy output, and a midline incision that still ended up draining persistent fluid that seems to all arise from a subcuticular infection based on the latest CT scan.  Decision was made at this point that since no further inpatient care was being provided, home health was set up where she will be able to continue TPN and recuperate at home in hopes that the nausea will eventually resolve.  At the time of discharge patient was taking in very small amounts of solid foods with minimal nausea.  Pain was controlled and the wound continue to be stable with moderate amounts of drainage at the inferior aspect.  She will be followed up closely as an outpatient for continued care, and referral made to a colorectal specialist for reversal of her ileostomy. Consults: ID, hematology/oncology and psychiatry  Discharge Exam: Blood pressure 119/80, pulse (!) 110, temperature 98.2 F (36.8 C), temperature source Oral, resp. rate 18, height 5\' 2"  (1.575 m), weight 95.7 kg, SpO2 97 %. General appearance: alert, cooperative and no distress GI: soft, non-tender; bowel sounds normal; no masses,  no organomegaly midline incision with staples and retention sutures in place.  Inferior aspect that had the former [place] drain site continues to remain healthy with moderate drainage of purulent material.    Disposition:  Discharge disposition: 01-Home or Self Care       Discharge Instructions     Discharge patient   Complete by: As directed    Discharge disposition: 01-Home or Self  Care   Discharge patient date: 08/19/2019   Home Health   Complete by: As directed    TPN per AHI for lab and dosing.  Also needs IR drain flushed twice daily with 37ml NS,as well as daily dressing change to midline incision with 4x4 gauze and abd pad.  Change as needed when  dressing is saturated   To provide the following care/treatments: RN   Home Health   Complete by: As directed    To provide the following care/treatments: PT      Allergies as of 08/19/2019       Reactions   [place] Hives        Medication List     STOP taking these medications    diclofenac 75 MG EC tablet Commonly known as: VOLTAREN       TAKE these medications    escitalopram 20 MG tablet Commonly known as: LEXAPRO Take 20 mg by mouth daily.   Fat emulsion 20 % Emul infusion Inject 360 mLs into the vein continuous.   feeding supplement (ENSURE ENLIVE) Liqd Take 237 mLs by mouth 3 (three) times daily between meals.   levothyroxine 125 MCG tablet Commonly known as: SYNTHROID Take 125 mcg by mouth daily before breakfast.   metoCLOPramide 5 MG tablet Commonly known as: Reglan Take 1 tablet (5 mg total) by mouth every 8 (eight) hours as needed for nausea or vomiting.   ondansetron 4 MG disintegrating tablet Commonly known as: Zofran ODT Take 1 tablet (4 mg total) by mouth every 8 (eight) hours as needed for nausea or vomiting.       Follow-up Information     Ward, Honor Loh, MD. Go on 09/02/2019.   Specialty: Obstetrics and Gynecology Why: 3pm Contact information: [place] [address] [place], [place], DO. Go on 08/27/2019.   Specialty: Surgery Why: 9:45am Contact information: [address] [place] [address] [phone]             Total time spent arranging discharge was >48min. Signed: Benjamine Sprague 08/20/2019, 2:29 PM

## 2019-08-24 MED ORDER — HEPARIN SODIUM (PORCINE) 10000 UNIT/ML IJ SOLN
7500.00 | INTRAMUSCULAR | Status: DC
Start: 2019-09-12 — End: 2019-08-24

## 2019-08-24 MED ORDER — SODIUM CHLORIDE 0.9 % IV SOLN
30.00 | INTRAVENOUS | Status: DC
Start: ? — End: 2019-08-24

## 2019-08-24 MED ORDER — ONDANSETRON 4 MG PO TBDP
4.00 | ORAL_TABLET | ORAL | Status: DC
Start: ? — End: 2019-08-24

## 2019-08-24 MED ORDER — ACETAMINOPHEN 10 MG/ML IV SOLN
1000.00 | INTRAVENOUS | Status: DC
Start: ? — End: 2019-08-24

## 2019-08-24 MED ORDER — GENERIC EXTERNAL MEDICATION
Status: DC
Start: ? — End: 2019-08-24

## 2019-08-24 MED ORDER — GENERIC EXTERNAL MEDICATION
Status: DC
Start: 2019-08-25 — End: 2019-08-24

## 2019-08-26 MED ORDER — OXYCODONE HCL 5 MG/5ML PO SOLN
5.00 | ORAL | Status: DC
Start: ? — End: 2019-08-26

## 2019-08-26 MED ORDER — ACETAMINOPHEN 160 MG/5ML PO SOLN
650.00 | ORAL | Status: DC
Start: ? — End: 2019-08-26

## 2019-08-26 MED ORDER — GABAPENTIN 250 MG/5ML PO SOLN
125.00 | ORAL | Status: DC
Start: 2019-08-26 — End: 2019-08-26

## 2019-08-26 MED ORDER — ESCITALOPRAM OXALATE 20 MG PO TABS
20.00 | ORAL_TABLET | ORAL | Status: DC
Start: 2019-08-27 — End: 2019-08-26

## 2019-08-26 MED ORDER — GENERIC EXTERNAL MEDICATION
125.00 | Status: DC
Start: 2019-09-04 — End: 2019-08-26

## 2019-08-26 MED ORDER — ESCITALOPRAM OXALATE 5 MG/5ML PO SOLN
20.00 | ORAL | Status: DC
Start: 2019-09-04 — End: 2019-08-26

## 2019-08-26 MED ORDER — GENERIC EXTERNAL MEDICATION
Status: DC
Start: ? — End: 2019-08-26

## 2019-08-27 MED ORDER — GENERIC EXTERNAL MEDICATION
Status: DC
Start: ? — End: 2019-08-27

## 2019-08-27 MED ORDER — GABAPENTIN 100 MG PO CAPS
100.00 | ORAL_CAPSULE | ORAL | Status: DC
Start: 2019-09-03 — End: 2019-08-27

## 2019-08-30 MED ORDER — OXYCODONE HCL 5 MG PO TABS
5.00 | ORAL_TABLET | ORAL | Status: DC
Start: ? — End: 2019-08-30

## 2019-08-30 MED ORDER — GENERIC EXTERNAL MEDICATION
Status: DC
Start: ? — End: 2019-08-30

## 2019-08-30 MED ORDER — SALINE NASAL SPRAY 0.65 % NA SOLN
1.00 | NASAL | Status: DC
Start: ? — End: 2019-08-30

## 2019-09-03 MED ORDER — FUROSEMIDE 10 MG/ML IJ SOLN
20.00 | INTRAMUSCULAR | Status: DC
Start: 2019-09-08 — End: 2019-09-03

## 2019-09-03 MED ORDER — GENERIC EXTERNAL MEDICATION
Status: DC
Start: ? — End: 2019-09-03

## 2019-09-03 MED ORDER — GENERIC EXTERNAL MEDICATION
12.50 | Status: DC
Start: ? — End: 2019-09-03

## 2019-09-03 MED ORDER — PSYLLIUM 58.6 % PO PACK
1.00 | PACK | ORAL | Status: DC
Start: 2019-09-03 — End: 2019-09-03

## 2019-09-03 MED ORDER — HYDROMORPHONE HCL 1 MG/ML IJ SOLN
0.50 | INTRAMUSCULAR | Status: DC
Start: ? — End: 2019-09-03

## 2019-09-04 LAB — FUNGUS CULTURE WITH STAIN

## 2019-09-04 LAB — FUNGAL ORGANISM REFLEX

## 2019-09-04 LAB — FUNGUS CULTURE RESULT

## 2019-09-06 MED ORDER — GENERIC EXTERNAL MEDICATION
Status: DC
Start: ? — End: 2019-09-06

## 2019-09-06 MED ORDER — PROCHLORPERAZINE MALEATE 10 MG PO TABS
10.00 | ORAL_TABLET | ORAL | Status: DC
Start: ? — End: 2019-09-06

## 2019-09-06 MED ORDER — GABAPENTIN 300 MG PO CAPS
300.00 | ORAL_CAPSULE | ORAL | Status: DC
Start: 2019-09-12 — End: 2019-09-06

## 2019-09-06 MED ORDER — LORAZEPAM 2 MG/ML IJ SOLN
1.00 | INTRAMUSCULAR | Status: DC
Start: ? — End: 2019-09-06

## 2019-09-06 MED ORDER — GENERIC EXTERNAL MEDICATION
Status: DC
Start: 2019-09-07 — End: 2019-09-06

## 2019-09-06 MED ORDER — LEVOTHYROXINE SODIUM 125 MCG PO TABS
125.00 | ORAL_TABLET | ORAL | Status: DC
Start: 2019-09-12 — End: 2019-09-06

## 2019-09-06 MED ORDER — SCOPOLAMINE 1 MG/3DAYS TD PT72
1.00 | MEDICATED_PATCH | TRANSDERMAL | Status: DC
Start: 2019-09-09 — End: 2019-09-06

## 2019-09-06 MED ORDER — ACETAMINOPHEN 325 MG PO TABS
650.00 | ORAL_TABLET | ORAL | Status: DC
Start: 2019-09-12 — End: 2019-09-06

## 2019-09-06 MED ORDER — ONDANSETRON 4 MG PO TBDP
8.00 | ORAL_TABLET | ORAL | Status: DC
Start: ? — End: 2019-09-06

## 2019-09-06 MED ORDER — ALUM & MAG HYDROXIDE-SIMETH 400-400-40 MG/5ML PO SUSP
30.00 | ORAL | Status: DC
Start: ? — End: 2019-09-06

## 2019-09-06 MED ORDER — LOPERAMIDE HCL 2 MG PO CAPS
2.00 | ORAL_CAPSULE | ORAL | Status: DC
Start: 2019-09-12 — End: 2019-09-06

## 2019-09-07 ENCOUNTER — Encounter: Payer: Self-pay | Admitting: Obstetrics and Gynecology

## 2019-09-08 MED ORDER — GENERIC EXTERNAL MEDICATION
Status: DC
Start: ? — End: 2019-09-08

## 2019-09-11 MED ORDER — LORAZEPAM 2 MG/ML IJ SOLN
1.00 | INTRAMUSCULAR | Status: DC
Start: ? — End: 2019-09-11

## 2019-09-11 MED ORDER — LACTATED RINGERS IV SOLN
125.00 | INTRAVENOUS | Status: DC
Start: 2019-09-12 — End: 2019-09-11

## 2019-09-11 MED ORDER — RIFAXIMIN 550 MG PO TABS
550.00 | ORAL_TABLET | ORAL | Status: DC
Start: 2019-09-12 — End: 2019-09-11

## 2019-09-11 MED ORDER — METOCLOPRAMIDE HCL 5 MG/ML IJ SOLN
10.00 | INTRAMUSCULAR | Status: DC
Start: 2019-09-12 — End: 2019-09-11

## 2019-09-11 MED ORDER — GENERIC EXTERNAL MEDICATION
Status: DC
Start: ? — End: 2019-09-11

## 2019-09-11 MED ORDER — ONDANSETRON HCL 4 MG/2ML IJ SOLN
4.00 | INTRAMUSCULAR | Status: DC
Start: ? — End: 2019-09-11

## 2019-09-11 MED ORDER — THERA-M PO TABS
1.00 | ORAL_TABLET | ORAL | Status: DC
Start: 2019-09-12 — End: 2019-09-11

## 2019-09-11 MED ORDER — FUROSEMIDE 20 MG PO TABS
20.00 | ORAL_TABLET | ORAL | Status: DC
Start: 2019-09-12 — End: 2019-09-11

## 2019-09-28 ENCOUNTER — Other Ambulatory Visit
Admission: RE | Admit: 2019-09-28 | Discharge: 2019-09-28 | Disposition: A | Discharge status: Home / Self Care | Payer: Managed Care, Other (non HMO) | Source: Ambulatory Visit | Attending: *Deleted | Admitting: *Deleted

## 2019-09-28 LAB — CBC WITH DIFFERENTIAL/PLATELET
Abs Immature Granulocytes: 0.02 10*3/uL (ref 0.00–0.07)
Basophils Absolute: 0.1 10*3/uL (ref 0.0–0.1)
Basophils Relative: 1 %
Eosinophils Absolute: 0.2 10*3/uL (ref 0.0–0.5)
Eosinophils Relative: 2 %
HCT: 35.6 % — ABNORMAL LOW (ref 36.0–46.0)
Hemoglobin: 11 g/dL — ABNORMAL LOW (ref 12.0–15.0)
Immature Granulocytes: 0 %
Lymphocytes Relative: 40 %
Lymphs Abs: 3.5 10*3/uL (ref 0.7–4.0)
MCH: 28 pg (ref 26.0–34.0)
MCHC: 30.9 g/dL (ref 30.0–36.0)
MCV: 90.6 fL (ref 80.0–100.0)
Monocytes Absolute: 0.6 10*3/uL (ref 0.1–1.0)
Monocytes Relative: 7 %
Neutro Abs: 4.4 10*3/uL (ref 1.7–7.7)
Neutrophils Relative %: 50 %
Platelets: 362 10*3/uL (ref 150–400)
RBC: 3.93 MIL/uL (ref 3.87–5.11)
RDW: 13.9 % (ref 11.5–15.5)
WBC: 8.8 10*3/uL (ref 4.0–10.5)
nRBC: 0 % (ref 0.0–0.2)

## 2019-09-28 LAB — COMPREHENSIVE METABOLIC PANEL
ALT: 105 U/L — ABNORMAL HIGH (ref 0–44)
AST: 45 U/L — ABNORMAL HIGH (ref 15–41)
Albumin: 3 g/dL — ABNORMAL LOW (ref 3.5–5.0)
Alkaline Phosphatase: 169 U/L — ABNORMAL HIGH (ref 38–126)
Anion gap: 9 (ref 5–15)
BUN: 16 mg/dL (ref 6–20)
CO2: 25 mmol/L (ref 22–32)
Calcium: 8.7 mg/dL — ABNORMAL LOW (ref 8.9–10.3)
Chloride: 104 mmol/L (ref 98–111)
Creatinine, Ser: 0.36 mg/dL — ABNORMAL LOW (ref 0.44–1.00)
GFR calc Af Amer: >60 mL/min (ref >60)
GFR calc non Af Amer: >60 mL/min (ref >60)
Glucose, Bld: 74 mg/dL (ref 70–99)
Potassium: 4.3 mmol/L (ref 3.5–5.1)
Sodium: 138 mmol/L (ref 135–145)
Total Bilirubin: 0.5 mg/dL (ref 0.3–1.2)
Total Protein: 7.7 g/dL (ref 6.5–8.1)

## 2019-09-28 LAB — MAGNESIUM: Magnesium: 2.1 mg/dL (ref 1.7–2.4)

## 2019-09-28 LAB — PHOSPHORUS: Phosphorus: 3.7 mg/dL (ref 2.5–4.6)

## 2022-02-12 IMAGING — US US EXTREM LOW VENOUS
1 series · 14 of 24 positions shown · non-contrast
Comparison: None.

CLINICAL DATA: Tachycardia.  History of recent surgery.

EXAM:
BILATERAL LOWER EXTREMITY VENOUS DOPPLER ULTRASOUND
TECHNIQUE: Gray-scale sonography with compression, as well as color and duplex
ultrasound, were performed to evaluate the deep venous system(s)
from the level of the common femoral vein through the popliteal and
proximal calf veins.

[Series 1: us venous img lower bilat (dvt) · portal-venous · 14 of 60 slices shown]
[im 1/60]
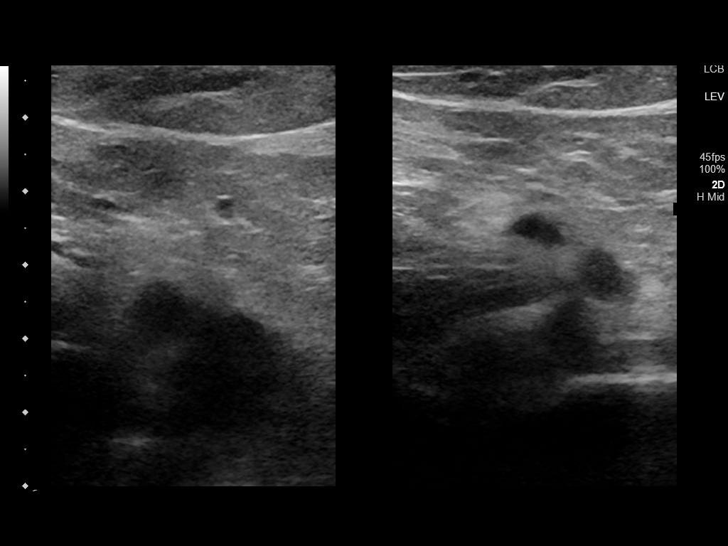
[im 6/60]
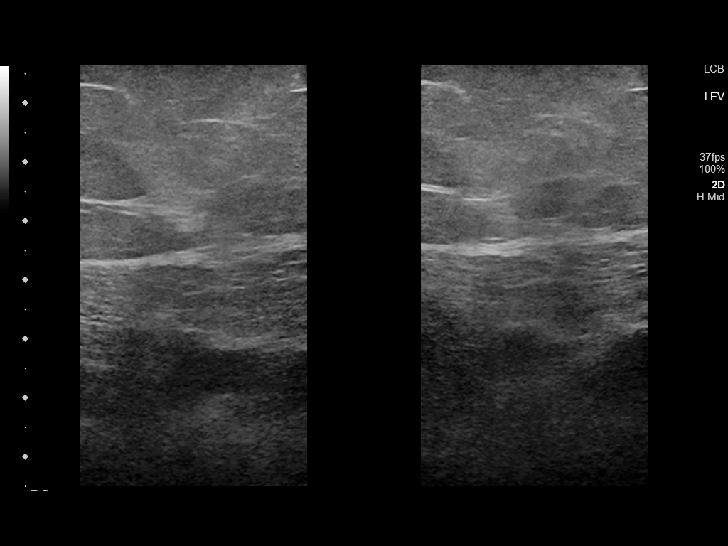
[im 11/60]
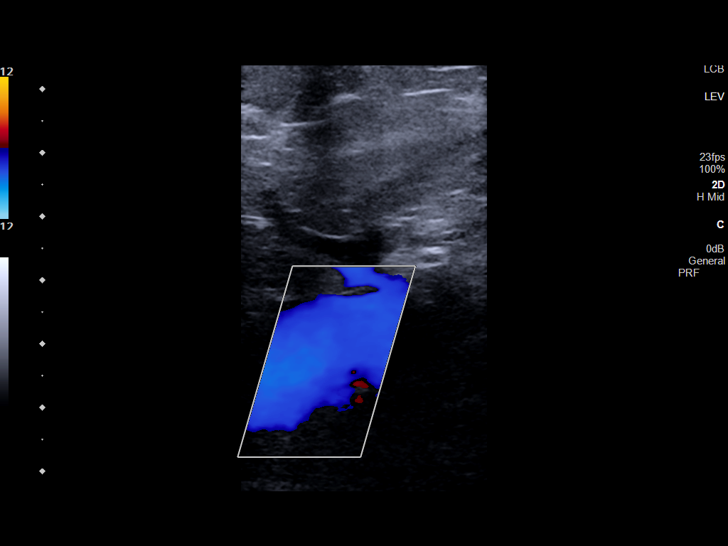
[im 16/60]
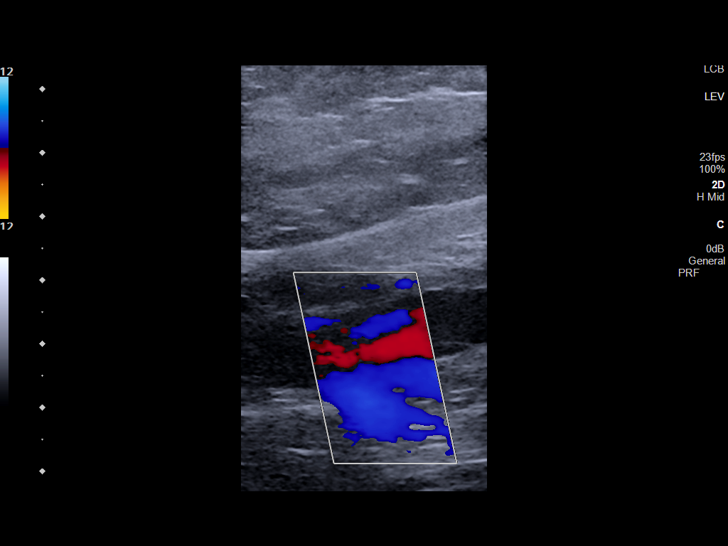
[im 18/60]
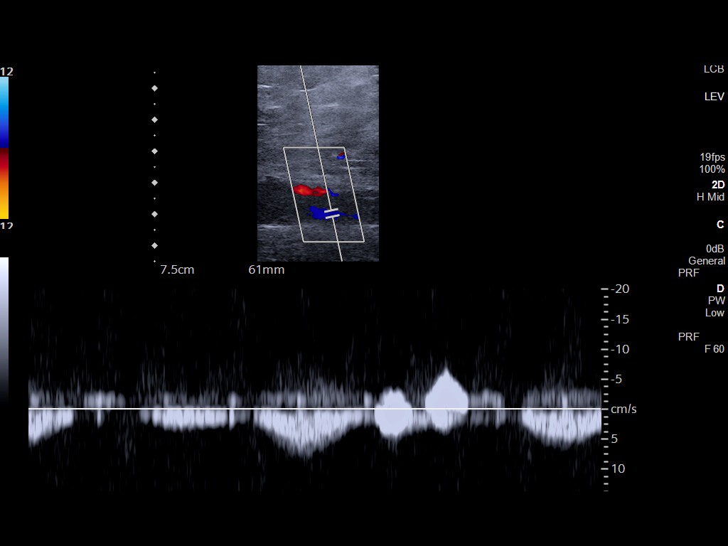
[im 24/60]
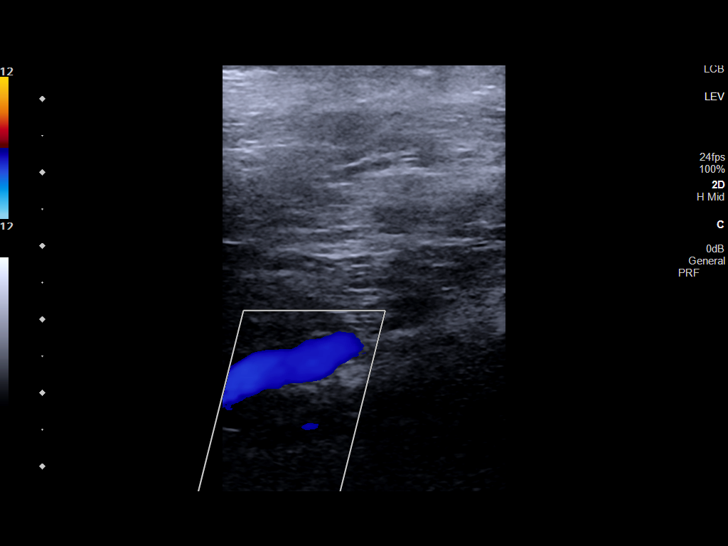
[im 29/60]
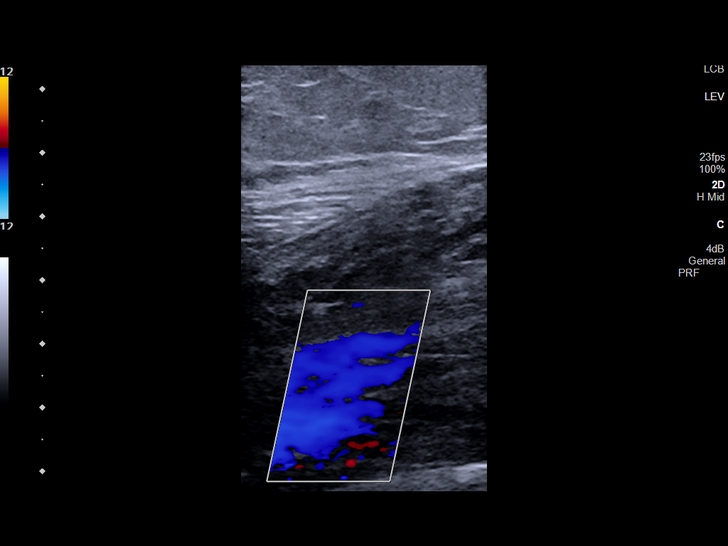
[im 31/60]
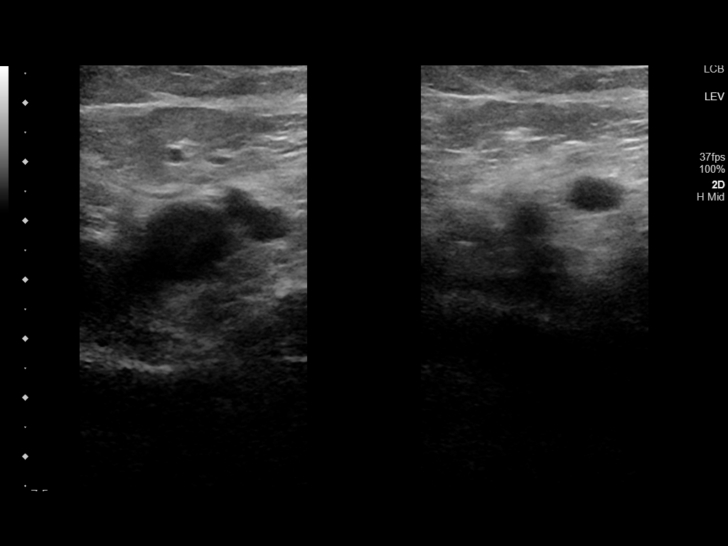
[im 36/60]
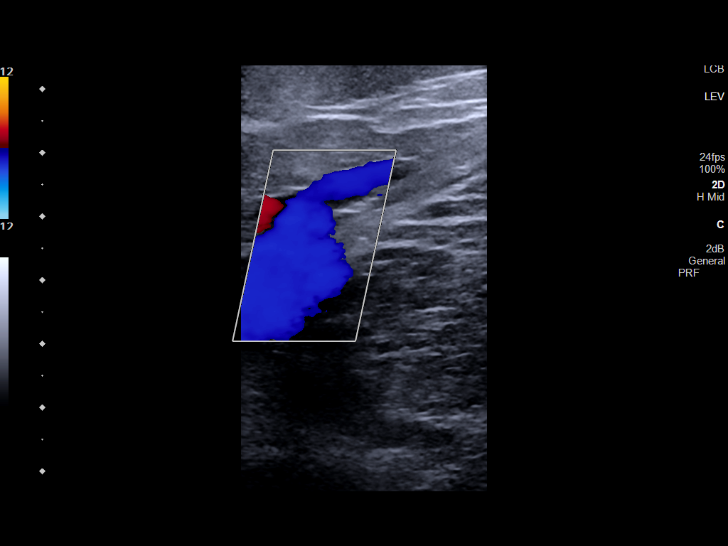
[im 42/60]
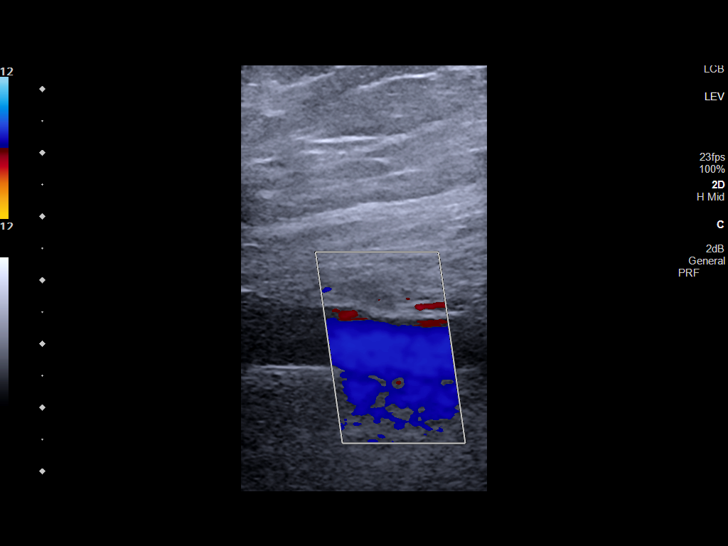
[im 47/60]
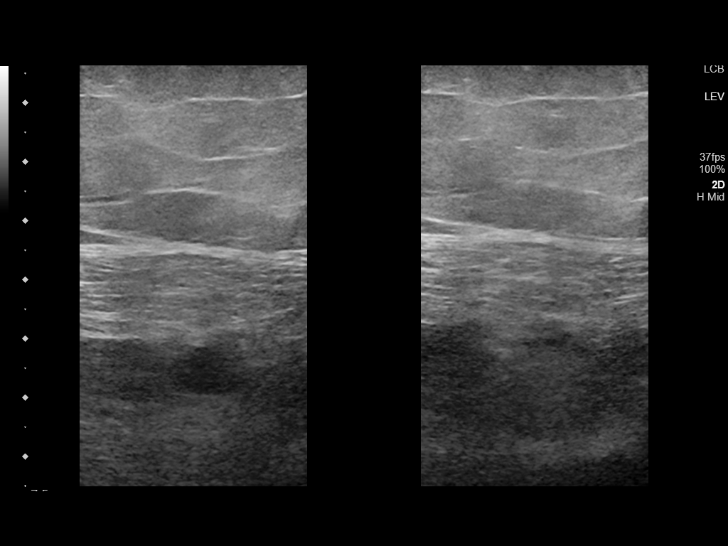
[im 49/60]
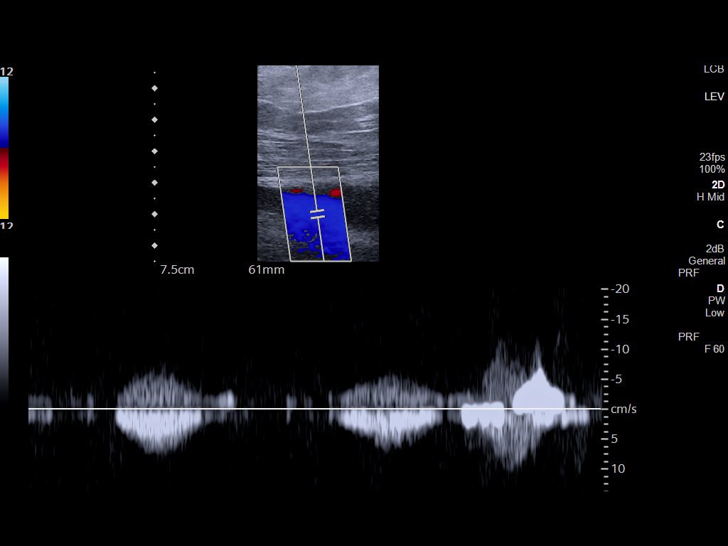
[im 54/60]
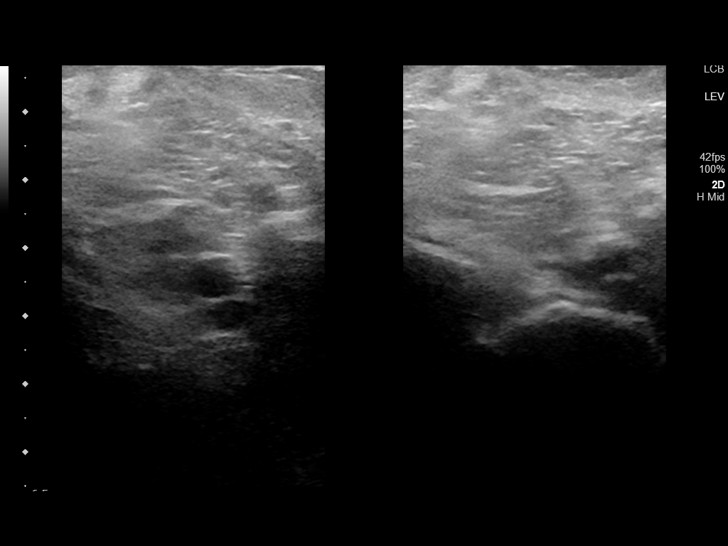
[im 60/60]
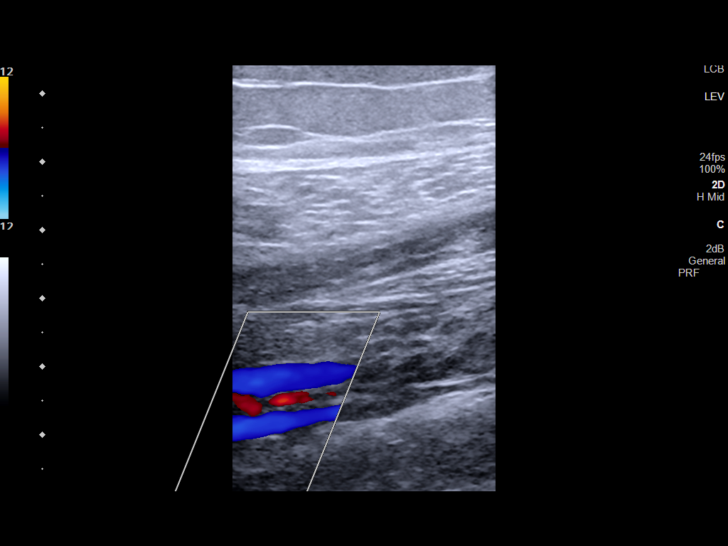

[14 of 24 positions shown; findings below may reference images not displayed]

FINDINGS: VENOUS

Normal compressibility of the common femoral, superficial femoral,
and popliteal veins, as well as the visualized calf veins.
Visualized portions of profunda femoral vein and great saphenous
vein unremarkable. No filling defects to suggest DVT on grayscale or
color Doppler imaging. Doppler waveforms show normal direction of
venous flow, normal respiratory phasicity and response to
augmentation.

Limited views of the contralateral common femoral vein are
unremarkable.

OTHER

Edema noted in the legs bilaterally.

Limitations: none
IMPRESSION: No femoropopliteal DVT nor evidence of DVT within the visualized
calf veins.

If clinical symptoms are inconsistent or if there are persistent or
worsening symptoms, further imaging (possibly involving the iliac
veins) may be warranted.

## 2022-02-18 IMAGING — US US ABSCESS DRAINAGE W/ CATHETER
1 series · 2 of 2 positions shown · non-contrast
Comparison: none

INDICATION: 43-year-old with recent hysterectomy and bowel surgery. Patient has
elevated white blood cell count and small intra-abdominal fluid
collections.

[Series 1: us abscess drainage w/ catheter · 0.26mm/px · 2 of 2 slices shown]
[im 1/2]
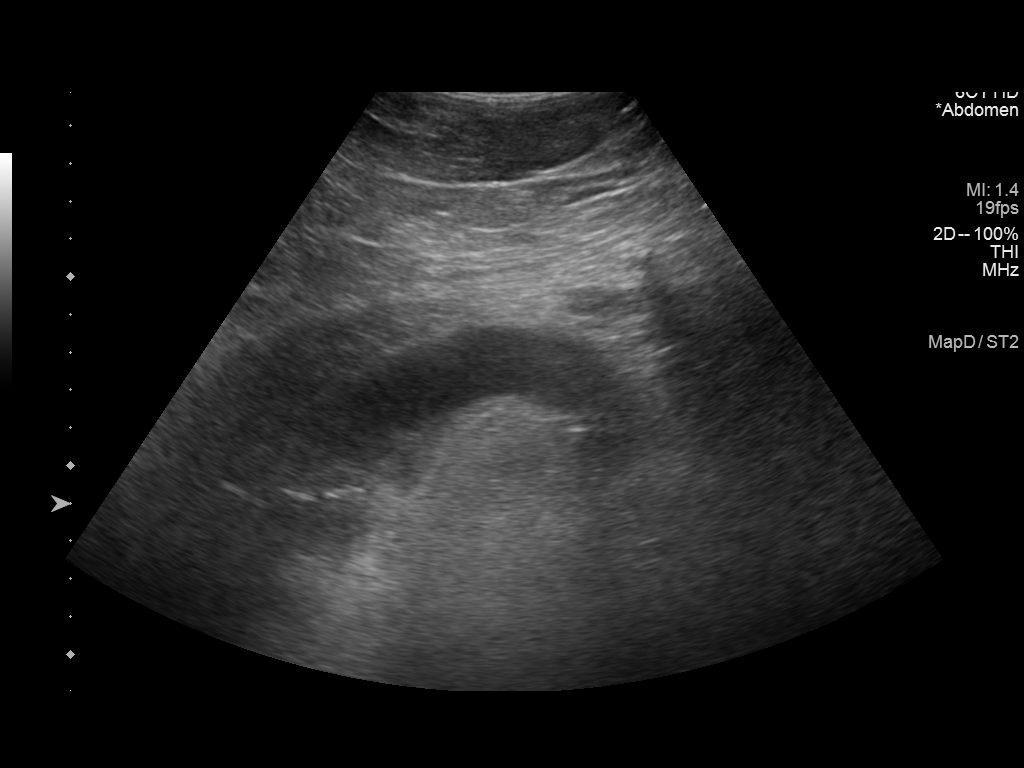
[im 2/2]
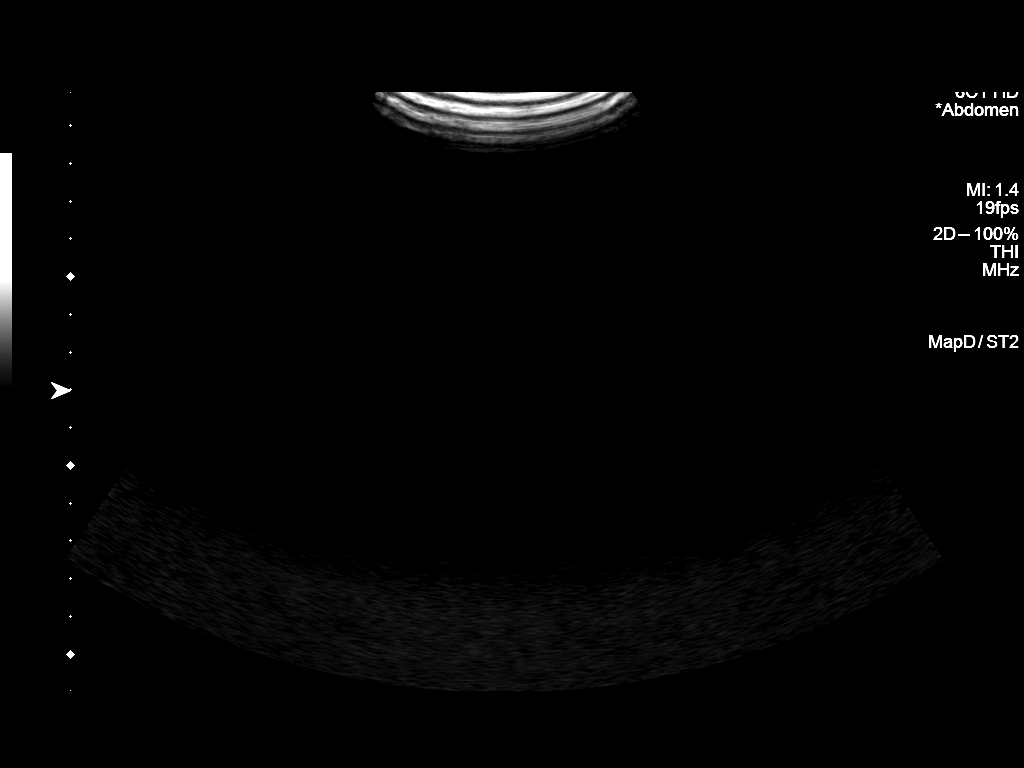

[2 of 2 positions shown; findings below may reference images not displayed]

EXAM:
ULTRASOUND-GUIDED ASPIRATION OF ABDOMINAL FLUID

MEDICATIONS:
Fentanyl 25 mcg

ANESTHESIA/SEDATION:
The patient was continuously monitored during the procedure by the
interventional radiology nurse under my direct supervision.

COMPLICATIONS:
None immediate.

PROCEDURE:
Informed written consent was obtained from the patient after a
thorough discussion of the procedural risks, benefits and
alternatives. All questions were addressed. A timeout was performed
prior to the initiation of the procedure.

Abdomen was evaluated with ultrasound. Pocket of fluid in the
anterior right lower abdomen was targeted just medial to the
existing percutaneous drain. Skin was prepped with chlorhexidine and
sterile field was created. Skin and soft tissues were anesthetized
with 1% lidocaine. Using ultrasound guidance, a Yueh catheter was
directed into this peritoneal fluid. Only 3 mL of fluid could be
obtained. Unable to aspirate majority of the fluid in this area.
Bandage placed over the puncture site.
FINDINGS: Small pocket of fluid along the right anterior lower abdomen just
medial to the existing percutaneous drain. Needle position confirmed
within this small collection but only a small amount of slightly
thick red fluid was removed.
IMPRESSION: Ultrasound-guided aspiration of fluid from the right anterior
abdomen. Only a small amount of slightly thick red fluid could be
obtained. Findings could represent blood products or hematoma
formation. Fluid was sent for culture.
